# Patient Record
Sex: Female | Born: 1935 | Race: Black or African American | Hispanic: No | Smoking: Never smoker
Health system: Southern US, Community
[De-identification: ages and names within clinical notes are randomized; demographics above are authoritative.]

## PROBLEM LIST (undated history)

## (undated) DIAGNOSIS — E079 Disorder of thyroid, unspecified: Secondary | ICD-10-CM

## (undated) DIAGNOSIS — I890 Lymphedema, not elsewhere classified: Secondary | ICD-10-CM

## (undated) DIAGNOSIS — I10 Essential (primary) hypertension: Secondary | ICD-10-CM

---

## 2010-10-22 DIAGNOSIS — Z823 Family history of stroke: Secondary | ICD-10-CM | POA: Insufficient documentation

## 2010-10-23 DIAGNOSIS — I10 Essential (primary) hypertension: Secondary | ICD-10-CM | POA: Insufficient documentation

## 2013-12-28 DIAGNOSIS — M179 Osteoarthritis of knee, unspecified: Secondary | ICD-10-CM | POA: Insufficient documentation

## 2013-12-28 DIAGNOSIS — M1710 Unilateral primary osteoarthritis, unspecified knee: Secondary | ICD-10-CM | POA: Insufficient documentation

## 2013-12-28 DIAGNOSIS — M1991 Primary osteoarthritis, unspecified site: Secondary | ICD-10-CM | POA: Insufficient documentation

## 2013-12-29 DIAGNOSIS — G609 Hereditary and idiopathic neuropathy, unspecified: Secondary | ICD-10-CM | POA: Insufficient documentation

## 2019-10-18 ENCOUNTER — Ambulatory Visit (INDEPENDENT_AMBULATORY_CARE_PROVIDER_SITE_OTHER): Payer: Medicare Other | Admitting: Vascular Surgery

## 2020-03-01 ENCOUNTER — Other Ambulatory Visit: Payer: Self-pay

## 2020-03-01 ENCOUNTER — Emergency Department
Admission: EM | Admit: 2020-03-01 | Discharge: 2020-03-02 | Disposition: A | Discharge status: Home / Self Care | Payer: Medicare Other | Attending: Emergency Medicine | Admitting: Emergency Medicine

## 2020-03-01 ENCOUNTER — Encounter: Payer: Self-pay | Admitting: *Deleted

## 2020-03-01 ENCOUNTER — Emergency Department: Payer: Medicare Other

## 2020-03-01 DIAGNOSIS — R2243 Localized swelling, mass and lump, lower limb, bilateral: Secondary | ICD-10-CM | POA: Diagnosis present

## 2020-03-01 DIAGNOSIS — R0602 Shortness of breath: Secondary | ICD-10-CM

## 2020-03-01 DIAGNOSIS — R5383 Other fatigue: Secondary | ICD-10-CM | POA: Diagnosis not present

## 2020-03-01 DIAGNOSIS — R609 Edema, unspecified: Secondary | ICD-10-CM

## 2020-03-01 DIAGNOSIS — R600 Localized edema: Secondary | ICD-10-CM | POA: Diagnosis not present

## 2020-03-01 DIAGNOSIS — M7989 Other specified soft tissue disorders: Secondary | ICD-10-CM

## 2020-03-01 LAB — HEPATIC FUNCTION PANEL
ALT: 12 U/L (ref 0–44)
AST: 23 U/L (ref 15–41)
Albumin: 4.3 g/dL (ref 3.5–5.0)
Alkaline Phosphatase: 78 U/L (ref 38–126)
Bilirubin, Direct: 0.2 mg/dL (ref 0.0–0.2)
Indirect Bilirubin: 0.9 mg/dL (ref 0.3–0.9)
Total Bilirubin: 1.1 mg/dL (ref 0.3–1.2)
Total Protein: 7.4 g/dL (ref 6.5–8.1)

## 2020-03-01 LAB — BASIC METABOLIC PANEL
Anion gap: 11 (ref 5–15)
BUN: 14 mg/dL (ref 8–23)
CO2: 30 mmol/L (ref 22–32)
Calcium: 9.3 mg/dL (ref 8.9–10.3)
Chloride: 102 mmol/L (ref 98–111)
Creatinine, Ser: 0.95 mg/dL (ref 0.44–1.00)
GFR calc Af Amer: >60 mL/min (ref >60)
GFR calc non Af Amer: 54 mL/min — ABNORMAL LOW (ref >60)
Glucose, Bld: 113 mg/dL — ABNORMAL HIGH (ref 70–99)
Potassium: 2.8 mmol/L — ABNORMAL LOW (ref 3.5–5.1)
Sodium: 143 mmol/L (ref 135–145)

## 2020-03-01 LAB — CBC
HCT: 35.9 % — ABNORMAL LOW (ref 36.0–46.0)
Hemoglobin: 11.7 g/dL — ABNORMAL LOW (ref 12.0–15.0)
MCH: 27.5 pg (ref 26.0–34.0)
MCHC: 32.6 g/dL (ref 30.0–36.0)
MCV: 84.5 fL (ref 80.0–100.0)
Platelets: 241 10*3/uL (ref 150–400)
RBC: 4.25 MIL/uL (ref 3.87–5.11)
RDW: 14.3 % (ref 11.5–15.5)
WBC: 5.9 10*3/uL (ref 4.0–10.5)
nRBC: 0 % (ref 0.0–0.2)

## 2020-03-01 LAB — TROPONIN I (HIGH SENSITIVITY): Troponin I (High Sensitivity): 12 ng/L (ref <18)

## 2020-03-01 LAB — BRAIN NATRIURETIC PEPTIDE: B Natriuretic Peptide: 134.7 pg/mL — ABNORMAL HIGH (ref 0.0–100.0)

## 2020-03-01 MED ORDER — POTASSIUM CHLORIDE 10 MEQ/100ML IV SOLN
10.0000 meq | Freq: Once | INTRAVENOUS | Status: AC
  Administered 2020-03-02: 10 meq via INTRAVENOUS
  Filled 2020-03-01: qty 100

## 2020-03-01 MED ORDER — POTASSIUM CHLORIDE 10 MEQ/100ML IV SOLN
10.0000 meq | Freq: Once | INTRAVENOUS | Status: AC
  Administered 2020-03-01: 10 meq via INTRAVENOUS
  Filled 2020-03-01: qty 100

## 2020-03-01 MED ORDER — POTASSIUM CHLORIDE CRYS ER 20 MEQ PO TBCR
40.0000 meq | EXTENDED_RELEASE_TABLET | Freq: Once | ORAL | Status: AC
  Administered 2020-03-01: 40 meq via ORAL
  Filled 2020-03-01: qty 2

## 2020-03-01 NOTE — ED Triage Notes (Signed)
Pt to triage via wheelchair.  Pt has swelling to lower legs for 2 months.  Worse last few days.  No chest pain or sob.  Pt alert  Speech clear.

## 2020-03-01 NOTE — ED Provider Notes (Signed)
[facility] Emergency Department Provider Note  ____________________________________________   First MD Initiated Contact with Patient 03/01/20 2201     (approximate)  I have reviewed the triage vital signs and the nursing notes.   HISTORY  Chief Complaint Leg Swelling    HPI Latoya Sloan is a 86 y.o. female  Here with chief complaint of bilateral leg swelling. History provided by pt and daughter, who helps care for pt. She states she is here today because her legs have become increasingly swollen over the last several weeks, to the point where she is now having more difficulty getting around. She reports she has "lymphedema" in her legs but denies any known h/o CHF (though she has seen Cardiology previously per review of records). She states she has had a mild, nonproductive cough associated with her leg swelling but otherwise no new complaints. Denies any trauma. No recent immobilization or surgeries. Denies known h/o DVT. No recent medication changes.         No past medical history on file.  There are no problems to display for this patient.     Prior to Admission medications   Medication Sig Start Date End Date Taking? Authorizing Provider  amLODipine (NORVASC) 5 MG tablet Take 5 mg by mouth daily.   Yes [provider]  aspirin 81 MG chewable tablet Chew 81 mg by mouth daily.   Yes [provider]  atorvastatin (LIPITOR) 40 MG tablet Take 40 mg by mouth daily.   Yes [provider]  Cholecalciferol 50 MCG (2000 UT) CAPS Take 2,000 Units by mouth daily.   Yes [provider]  hydrochlorothiazide (HYDRODIURIL) 25 MG tablet Take 25 mg by mouth daily.   Yes [provider]  levothyroxine (SYNTHROID) 25 MCG tablet Take 25 mcg by mouth daily.   Yes [provider]  losartan (COZAAR) 100 MG tablet Take 100 mg by mouth daily.   Yes [provider]  metoprolol tartrate (LOPRESSOR) 25 MG tablet  Take 25 mg by mouth daily.   Yes [provider]  multivitamin-iron-minerals-folic acid (CENTRUM) chewable tablet Chew 1 tablet by mouth daily.   Yes [provider]  omeprazole (PRILOSEC) 40 MG capsule Take 40 mg by mouth daily.   Yes [provider]    Allergies Patient has no known allergies.  No family history on file.  Social History Social History   Tobacco Use  . Smoking status: Never Smoker  . Smokeless tobacco: Never Used  Substance Use Topics  . Alcohol use: Not Currently  . Drug use: Not Currently    Review of Systems  Review of Systems  Constitutional: Positive for fatigue. Negative for chills and fever.  HENT: Negative for congestion and sore throat.   Eyes: Negative for visual disturbance.  Respiratory: Negative for cough and shortness of breath.   Cardiovascular: Positive for leg swelling. Negative for chest pain.  Gastrointestinal: Negative for abdominal pain, diarrhea, nausea and vomiting.  Genitourinary: Negative for flank pain.  Musculoskeletal: Negative for back pain and neck pain.  Skin: Negative for rash and wound.  Allergic/Immunologic: Negative for immunocompromised state.  Neurological: Negative for weakness and numbness.  Hematological: Does not bruise/bleed easily.  All other systems reviewed and are negative.    ____________________________________________  PHYSICAL EXAM:      VITAL SIGNS: ED Triage Vitals  Enc Vitals Group     BP 03/01/20 1609 (!) 152/56     Pulse Rate 03/01/20 1609 (!) 58  Resp 03/01/20 1609 18     Temp 03/01/20 1609 99.2 F (37.3 C)     Temp Source 03/01/20 1609 Oral     SpO2 03/01/20 1609 100 %     Weight 03/01/20 1609 261 lb (118.4 kg)     Height 03/01/20 1613 5\' 7"  (1.702 m)     Head Circumference --      Peak Flow --      Pain Score 03/01/20 1613 9     Pain Loc --      Pain Edu? --      Excl. in GC? --      Physical Exam Vitals and nursing note reviewed.  Constitutional:       General: She is not in acute distress.    Appearance: She is well-developed.  HENT:     Head: Normocephalic and atraumatic.  Eyes:     Conjunctiva/sclera: Conjunctivae normal.  Cardiovascular:     Rate and Rhythm: Normal rate and regular rhythm.     Heart sounds: Normal heart sounds. No murmur heard.  No friction rub.  Pulmonary:     Effort: Pulmonary effort is normal. No respiratory distress.     Breath sounds: Rales (mild, bibasilar) present. No wheezing.  Abdominal:     General: There is no distension.     Palpations: Abdomen is soft.     Tenderness: There is no abdominal tenderness.  Musculoskeletal:     Cervical back: Neck supple.     Right lower leg: Edema (2+ pitting) present.     Left lower leg: Edema (2+ pitting) present.  Skin:    General: Skin is warm.     Capillary Refill: Capillary refill takes less than 2 seconds.  Neurological:     Mental Status: She is alert and oriented to person, place, and time.     Motor: No abnormal muscle tone.       ____________________________________________   LABS (all labs ordered are listed, but only abnormal results are displayed)  Labs Reviewed  BASIC METABOLIC PANEL - Abnormal; Notable for the following components:      Result Value   Potassium 2.8 (*)    Glucose, Bld 113 (*)    GFR calc non Af Amer 54 (*)    All other components within normal limits  CBC - Abnormal; Notable for the following components:   Hemoglobin 11.7 (*)    HCT 35.9 (*)    All other components within normal limits  BRAIN NATRIURETIC PEPTIDE - Abnormal; Notable for the following components:   B Natriuretic Peptide 134.7 (*)    All other components within normal limits  HEPATIC FUNCTION PANEL  TSH  TROPONIN I (HIGH SENSITIVITY)  TROPONIN I (HIGH SENSITIVITY)    ____________________________________________  EKG: ________________________________________  RADIOLOGY All imaging, including plain films, CT scans, and ultrasounds,  independently reviewed by me, and interpretations confirmed via formal radiology reads.  ED MD interpretation:   CXR: Cardiomegaly, possible early interstitial edema  Official radiology report(s): DG Chest Port 1 View  Result Date: 03/01/2020 CLINICAL DATA:  Lower leg swelling for 2 months EXAM: PORTABLE CHEST 1 VIEW COMPARISON:  None. FINDINGS: Cardiomegaly, vascular congestion. Interstitial prominence could reflect early interstitial edema. No effusions or acute bony abnormality. IMPRESSION: Cardiomegaly.  Suspect early interstitial edema. Electronically Signed   By: 03/03/2020 M.D.   On: 03/01/2020 23:03    ____________________________________________  PROCEDURES   Procedure(s) performed (including Critical Care):  Procedures  ____________________________________________  INITIAL IMPRESSION / MDM /  ASSESSMENT AND PLAN / ED COURSE  As part of my medical decision making, I reviewed the following data within the electronic MEDICAL RECORD NUMBER Nursing notes reviewed and incorporated, Old chart reviewed, Notes from prior ED visits, and [place] Controlled Substance Database       *Latoya Sloan was evaluated in Emergency Department on 03/02/2020 for the symptoms described in the history of present illness. She was evaluated in the context of the global COVID-19 pandemic, which necessitated consideration that the patient might be at risk for infection with the SARS-CoV-2 virus that causes COVID-19. Institutional protocols and algorithms that pertain to the evaluation of patients at risk for COVID-19 are in a state of rapid change based on information released by regulatory bodies including the CDC and federal and state organizations. These policies and algorithms were followed during the patient's care in the ED.  Some ED evaluations and interventions may be delayed as a result of limited staffing during the pandemic.*     Medical Decision Making:  86 yo F here with worsening b/l leg edema. She  reports a h/o lymphedema but exam, history is more concerning for possible underlying CHF. Per review of recent clinic notes, pt had unremarkable Cardiology w/u in the past but it has been several years. CXR shows cardiomegaly and possible edema. Renal function is normal. Potassium low at 2.8. No known liver disease.   Suspect likely mild CHF exacerbation, with unclear history of the extent of her HF per records. Given that overall she appears clinically very well, with normal WOB and sats >95% on RA, with no CP or signs of ischemia, will consider discharge once lytes replaced and lasix given, if pt appears well. Will ask Dr. Scotty Court to f/u remainder of labs, response to lasix and potassium, and reassess. ____________________________________________  FINAL CLINICAL IMPRESSION(S) / ED DIAGNOSES  Final diagnoses:  Peripheral edema  Leg swelling     MEDICATIONS GIVEN DURING THIS VISIT:  Medications  potassium chloride SA (KLOR-CON) CR tablet 40 mEq (40 mEq Oral Given 03/01/20 2255)  potassium chloride 10 mEq in 100 mL IVPB (0 mEq Intravenous Stopped 03/02/20 0015)  potassium chloride 10 mEq in 100 mL IVPB (0 mEq Intravenous Stopped 03/02/20 0206)  furosemide (LASIX) injection 20 mg (20 mg Intravenous Given 03/02/20 0214)     ED Discharge Orders    None       Note:  This document was prepared using Dragon voice recognition software and may include unintentional dictation errors.   Shaune Pollack, MD 03/02/20 [phone]

## 2020-03-02 DIAGNOSIS — R600 Localized edema: Secondary | ICD-10-CM | POA: Diagnosis not present

## 2020-03-02 LAB — TROPONIN I (HIGH SENSITIVITY): Troponin I (High Sensitivity): 13 ng/L (ref <18)

## 2020-03-02 LAB — TSH: TSH: 2.592 u[IU]/mL (ref 0.350–4.500)

## 2020-03-02 MED ORDER — FUROSEMIDE 10 MG/ML IJ SOLN
20.0000 mg | Freq: Once | INTRAMUSCULAR | Status: AC
  Administered 2020-03-02: 20 mg via INTRAVENOUS
  Filled 2020-03-02: qty 4

## 2020-03-02 MED ORDER — POTASSIUM CHLORIDE ER 10 MEQ PO TBCR: 10.0000 meq | EXTENDED_RELEASE_TABLET | Freq: Two times a day (BID) | ORAL | 0 refills | Status: DC

## 2020-03-02 MED ORDER — FUROSEMIDE 20 MG PO TABS: 20.0000 mg | ORAL_TABLET | Freq: Every day | ORAL | 0 refills | Status: DC

## 2020-03-02 NOTE — ED Notes (Signed)
Inserted [employer]ick

## 2020-03-02 NOTE — Discharge Instructions (Signed)
For your leg swelling, it is VERY important that you follow-up with a Cardiologist (Dr. Gwen Pounds is on call for [facility] today) and your primary doctor.  You should have an echocardiogram to look at your heart function in the next 1-2 weeks. Discuss this with your doctor.  For now, start taking the lasix as prescribed with potassium.   Try to eat a low-salt diet.  Elevate your legs as often as possible.

## 2020-03-02 NOTE — ED Notes (Signed)
Discharge paperwork, followup and prescriptions reviewed with patient and patients daughter. Pt and daughter state understanding, deny further questions. Pt gave verbal consent for DC, sign pad unavailable.

## 2020-03-02 NOTE — ED Provider Notes (Signed)
Procedures     ----------------------------------------- 3:08 AM on 03/02/2020 ----------------------------------------- Patient has received IV and oral potassium supplementation.  TSH and serial troponins are normal.  Vitals unremarkable, breathing stable, stable for discharge home with prescriptions for oral Lasix and potassium.       Sharman Cheek, MD 03/02/20 [phone]

## 2020-03-02 NOTE — ED Notes (Signed)
Assisted pt to toilet 

## 2020-03-06 ENCOUNTER — Ambulatory Visit (INDEPENDENT_AMBULATORY_CARE_PROVIDER_SITE_OTHER): Payer: Medicare Other | Admitting: Nurse Practitioner

## 2020-03-06 ENCOUNTER — Other Ambulatory Visit: Payer: Self-pay

## 2020-03-06 ENCOUNTER — Encounter (INDEPENDENT_AMBULATORY_CARE_PROVIDER_SITE_OTHER): Payer: Self-pay | Admitting: Nurse Practitioner

## 2020-03-06 VITALS — BP 146/74 | HR 64 | Ht 67.0 in | Wt 256.0 lb

## 2020-03-06 DIAGNOSIS — E785 Hyperlipidemia, unspecified: Secondary | ICD-10-CM | POA: Insufficient documentation

## 2020-03-06 DIAGNOSIS — K219 Gastro-esophageal reflux disease without esophagitis: Secondary | ICD-10-CM | POA: Insufficient documentation

## 2020-03-06 DIAGNOSIS — M170 Bilateral primary osteoarthritis of knee: Secondary | ICD-10-CM | POA: Insufficient documentation

## 2020-03-06 DIAGNOSIS — R430 Anosmia: Secondary | ICD-10-CM | POA: Insufficient documentation

## 2020-03-06 DIAGNOSIS — I6523 Occlusion and stenosis of bilateral carotid arteries: Secondary | ICD-10-CM | POA: Diagnosis not present

## 2020-03-06 DIAGNOSIS — R053 Chronic cough: Secondary | ICD-10-CM | POA: Insufficient documentation

## 2020-03-06 DIAGNOSIS — I10 Essential (primary) hypertension: Secondary | ICD-10-CM

## 2020-03-06 DIAGNOSIS — I890 Lymphedema, not elsewhere classified: Secondary | ICD-10-CM | POA: Diagnosis not present

## 2020-03-06 DIAGNOSIS — R0609 Other forms of dyspnea: Secondary | ICD-10-CM | POA: Insufficient documentation

## 2020-03-06 DIAGNOSIS — G43909 Migraine, unspecified, not intractable, without status migrainosus: Secondary | ICD-10-CM | POA: Insufficient documentation

## 2020-03-06 NOTE — Progress Notes (Signed)
Subjective:    Patient ID: Latoya Sloan, female    DOB: 1931-12-23, 86 y.o.   MRN: [id number] Chief Complaint  Patient presents with  . New Patient (Initial Visit)    kaliseti. lymphedema    The patient presents today for evaluation of bilateral lower extremity edema.  The patient recently relocated to live with her daughter and has a known history of lymphedema.  The patient subsequently presented to the emergency room with shortness of breath and was found to have some cardiomegaly.  Today the patient also followed up with cardiology and will have an upcoming echocardiogram in addition to a carotid artery duplex due to her carotid bruit.  The patient endorses wearing medical grade 1 compression wraps consistently at, however despite this the patient has had continued edema.  The patient also elevates her lower extremities as much as possible.  Prior to relocating from wound and the patient was being seen at a lymphedema clinic.  She notes that the lymphedema clinic was also helpful in controlling her lower extremity edema.  Currently she denies any chest pain or shortness of breath.  She denies any TIA-like symptoms.  The patient also notes that she has a known history of a Baker's cyst in her right lower extremity.   Review of Systems  Cardiovascular: Positive for leg swelling.  Skin: Positive for wound.  All other systems reviewed and are negative.      Objective:   Physical Exam Vitals reviewed.  Constitutional:      Appearance: She is obese.  HENT:     Head: Normocephalic.  Neck:     Vascular: Carotid bruit present.  Cardiovascular:     Rate and Rhythm: Normal rate and regular rhythm.     Pulses: Normal pulses.  Pulmonary:     Effort: Pulmonary effort is normal.  Musculoskeletal:     Right lower leg: 3+ Edema present.     Left lower leg: 3+ Edema present.  Skin:    General: Skin is warm and dry.  Neurological:     Mental Status: She is alert and oriented to person,  place, and time.     Motor: Weakness present.     Gait: Gait abnormal.  Psychiatric:        Mood and Affect: Mood normal.        Behavior: Behavior normal.        Thought Content: Thought content normal.        Judgment: Judgment normal.     BP (!) 146/74   Pulse 64   Ht 5\' 7"  (1.702 m)   Wt 256 lb (116.1 kg)   BMI 40.10 kg/m   History reviewed. No pertinent past medical history.  Social History   Socioeconomic History  . Marital status: Unknown    Spouse name: Not on file  . Number of children: Not on file  . Years of education: Not on file  . Highest education level: Not on file  Occupational History  . Not on file  Tobacco Use  . Smoking status: Never Smoker  . Smokeless tobacco: Never Used  Substance and Sexual Activity  . Alcohol use: Not Currently  . Drug use: Not Currently  . Sexual activity: Not on file  Other Topics Concern  . Not on file  Social History Narrative  . Not on file   Social Determinants of Health   Financial Resource Strain:   . Difficulty of Paying Living Expenses: Not on file  Food Insecurity:   .  Worried About Programme researcher, broadcasting/film/video in the Last Year: Not on file  . Ran Out of Food in the Last Year: Not on file  Transportation Needs:   . Lack of Transportation (Medical): Not on file  . Lack of Transportation (Non-Medical): Not on file  Physical Activity:   . Days of Exercise per Week: Not on file  . Minutes of Exercise per Session: Not on file  Stress:   . Feeling of Stress : Not on file  Social Connections:   . Frequency of Communication with Friends and Family: Not on file  . Frequency of Social Gatherings with Friends and Family: Not on file  . Attends Religious Services: Not on file  . Active Member of Clubs or Organizations: Not on file  . Attends Banker Meetings: Not on file  . Marital Status: Not on file  Intimate Partner Violence:   . Fear of Current or Ex-Partner: Not on file  . Emotionally Abused: Not on  file  . Physically Abused: Not on file  . Sexually Abused: Not on file    History reviewed. No pertinent surgical history.  History reviewed. No pertinent family history.  No Known Allergies     Assessment & Plan:   1. Lymphedema The patient has a longstanding history of lymphedema.  The patient diligently wears medical grade 1 compression stockings in addition to elevation and exercise as tolerable.  The patient does not have a lymphedema pump at this time due to cost, however she is unsure why she was able to get it through her insurance.  The patient does have some early signs of blistering.  Based on this we will place the patient in bilateral Unna wraps.  Patient will continue to elevate her lower extremities as well.  These are  wraps to be changed on a weekly basis.  We will have the patient follow-up in 4 weeks for evaluation of the progress with Unna wraps in addition to a bilateral lower extremity venous reflux to assess for any possible venous insufficiency.  2. Bilateral carotid artery stenosis Patient notes that a bruit was heard during her office visit with cardiology.  The patient notes that the cardiologist plans on ordering a carotid duplex to evaluate carotid artery stenosis.  I discussed the typical monitoring for carotid artery stenosis as well as the typical level of intervention which would be 70 to 75%.  Advised that the patient was welcome to follow-up with cardiology until intervention was necessary or if cardiology preferred we could follow carotid artery stenosis if necessary.  3. Essential hypertension Continue antihypertensive medications as already ordered, these medications have been reviewed and there are no changes at this time.    Current Outpatient Medications on File Prior to Visit  Medication Sig Dispense Refill  . aspirin 81 MG chewable tablet Chew 81 mg by mouth daily.    Marland Kitchen atorvastatin (LIPITOR) 40 MG tablet Take 40 mg by mouth daily.    .  Cholecalciferol 50 MCG (2000 UT) CAPS Take 2,000 Units by mouth daily.    . furosemide (LASIX) 20 MG tablet Take 1 tablet (20 mg total) by mouth daily for 10 days. 10 tablet 0  . hydrochlorothiazide (HYDRODIURIL) 25 MG tablet Take 25 mg by mouth daily.    Marland Kitchen levothyroxine (SYNTHROID) 25 MCG tablet Take 25 mcg by mouth daily.    Marland Kitchen losartan (COZAAR) 100 MG tablet Take 100 mg by mouth daily.    . metoprolol tartrate (LOPRESSOR) 25 MG  tablet Take 25 mg by mouth daily.    . multivitamin-iron-minerals-folic acid (CENTRUM) chewable tablet Chew 1 tablet by mouth daily.    Marland Kitchen omeprazole (PRILOSEC) 40 MG capsule Take 40 mg by mouth daily.    . potassium chloride (KLOR-CON) 10 MEQ tablet Take 1 tablet (10 mEq total) by mouth 2 (two) times daily for 14 days. 28 tablet 0  . amLODipine (NORVASC) 5 MG tablet Take 5 mg by mouth daily. (Patient not taking: Reported on 03/06/2020)     No current facility-administered medications on file prior to visit.    There are no Patient Instructions on file for this visit. No follow-ups on file.   Georgiana Spinner, NP

## 2020-03-14 ENCOUNTER — Other Ambulatory Visit: Payer: Self-pay

## 2020-03-14 ENCOUNTER — Ambulatory Visit (INDEPENDENT_AMBULATORY_CARE_PROVIDER_SITE_OTHER): Payer: Medicare Other | Admitting: Nurse Practitioner

## 2020-03-14 VITALS — BP 155/60 | HR 55 | Ht 67.0 in | Wt 249.0 lb

## 2020-03-14 DIAGNOSIS — I890 Lymphedema, not elsewhere classified: Secondary | ICD-10-CM

## 2020-03-14 NOTE — Progress Notes (Signed)
History of Present Illness  There is no documented history at this time  Assessments & Plan   There are no diagnoses linked to this encounter.    Additional instructions  Subjective:  Patient presents with venous ulcer of the Bilateral lower extremity.    Procedure:  3 layer unna wrap was placed Bilateral lower extremity.   Plan:   Follow up in one week.  

## 2020-03-15 ENCOUNTER — Encounter (INDEPENDENT_AMBULATORY_CARE_PROVIDER_SITE_OTHER): Payer: Self-pay | Admitting: Nurse Practitioner

## 2020-03-20 DIAGNOSIS — R200 Anesthesia of skin: Secondary | ICD-10-CM | POA: Insufficient documentation

## 2020-03-20 DIAGNOSIS — M7989 Other specified soft tissue disorders: Secondary | ICD-10-CM | POA: Insufficient documentation

## 2020-03-20 DIAGNOSIS — I890 Lymphedema, not elsewhere classified: Secondary | ICD-10-CM | POA: Insufficient documentation

## 2020-03-21 ENCOUNTER — Ambulatory Visit (INDEPENDENT_AMBULATORY_CARE_PROVIDER_SITE_OTHER): Payer: Medicare Other | Admitting: Nurse Practitioner

## 2020-03-21 ENCOUNTER — Other Ambulatory Visit: Payer: Self-pay

## 2020-03-21 VITALS — BP 121/70 | HR 60 | Ht 67.0 in | Wt 250.0 lb

## 2020-03-21 DIAGNOSIS — I890 Lymphedema, not elsewhere classified: Secondary | ICD-10-CM

## 2020-03-21 NOTE — Progress Notes (Signed)
History of Present Illness  There is no documented history at this time  Assessments & Plan   There are no diagnoses linked to this encounter.    Additional instructions  Subjective:  Patient presents with venous ulcer of the Bilateral lower extremity.    Procedure:  3 layer unna wrap was placed Bilateral lower extremity.   Plan:   Follow up in one week.  

## 2020-03-22 ENCOUNTER — Other Ambulatory Visit (INDEPENDENT_AMBULATORY_CARE_PROVIDER_SITE_OTHER): Payer: Self-pay | Admitting: Nurse Practitioner

## 2020-03-22 DIAGNOSIS — I890 Lymphedema, not elsewhere classified: Secondary | ICD-10-CM

## 2020-03-25 ENCOUNTER — Encounter (INDEPENDENT_AMBULATORY_CARE_PROVIDER_SITE_OTHER): Payer: Self-pay | Admitting: Nurse Practitioner

## 2020-03-27 ENCOUNTER — Ambulatory Visit (INDEPENDENT_AMBULATORY_CARE_PROVIDER_SITE_OTHER): Payer: Medicare Other | Admitting: Nurse Practitioner

## 2020-03-27 ENCOUNTER — Ambulatory Visit (INDEPENDENT_AMBULATORY_CARE_PROVIDER_SITE_OTHER): Payer: Medicare Other | Admitting: Vascular Surgery

## 2020-03-27 ENCOUNTER — Other Ambulatory Visit: Payer: Self-pay

## 2020-03-27 ENCOUNTER — Encounter (INDEPENDENT_AMBULATORY_CARE_PROVIDER_SITE_OTHER): Payer: Medicare Other

## 2020-03-27 VITALS — BP 155/75 | HR 59 | Ht 67.0 in | Wt 251.0 lb

## 2020-03-27 DIAGNOSIS — I890 Lymphedema, not elsewhere classified: Secondary | ICD-10-CM | POA: Diagnosis not present

## 2020-03-27 NOTE — Progress Notes (Signed)
History of Present Illness  There is no documented history at this time  Assessments & Plan   There are no diagnoses linked to this encounter.    Additional instructions  Subjective:  Patient presents with venous ulcer of the Bilateral lower extremity.    Procedure:  3 layer unna wrap was placed Bilateral lower extremity.   Plan:   Follow up in one week.  

## 2020-03-28 ENCOUNTER — Encounter (INDEPENDENT_AMBULATORY_CARE_PROVIDER_SITE_OTHER): Payer: Medicare Other

## 2020-03-30 ENCOUNTER — Encounter (INDEPENDENT_AMBULATORY_CARE_PROVIDER_SITE_OTHER): Payer: Self-pay | Admitting: Nurse Practitioner

## 2020-04-06 ENCOUNTER — Other Ambulatory Visit: Payer: Self-pay

## 2020-04-06 ENCOUNTER — Ambulatory Visit (INDEPENDENT_AMBULATORY_CARE_PROVIDER_SITE_OTHER): Payer: Medicare Other | Admitting: Nurse Practitioner

## 2020-04-06 ENCOUNTER — Ambulatory Visit (INDEPENDENT_AMBULATORY_CARE_PROVIDER_SITE_OTHER): Payer: Medicare Other

## 2020-04-06 ENCOUNTER — Encounter (INDEPENDENT_AMBULATORY_CARE_PROVIDER_SITE_OTHER): Payer: Self-pay | Admitting: Nurse Practitioner

## 2020-04-06 VITALS — BP 187/79 | HR 60 | Resp 16 | Wt 249.6 lb

## 2020-04-06 DIAGNOSIS — I890 Lymphedema, not elsewhere classified: Secondary | ICD-10-CM | POA: Diagnosis not present

## 2020-04-06 DIAGNOSIS — I10 Essential (primary) hypertension: Secondary | ICD-10-CM | POA: Diagnosis not present

## 2020-04-06 DIAGNOSIS — E785 Hyperlipidemia, unspecified: Secondary | ICD-10-CM

## 2020-04-06 DIAGNOSIS — I5032 Chronic diastolic (congestive) heart failure: Secondary | ICD-10-CM | POA: Insufficient documentation

## 2020-04-11 ENCOUNTER — Other Ambulatory Visit: Payer: Self-pay | Admitting: Physician Assistant

## 2020-04-11 ENCOUNTER — Encounter (INDEPENDENT_AMBULATORY_CARE_PROVIDER_SITE_OTHER): Payer: Medicare Other

## 2020-04-11 ENCOUNTER — Ambulatory Visit (INDEPENDENT_AMBULATORY_CARE_PROVIDER_SITE_OTHER): Payer: Medicare Other | Admitting: Nurse Practitioner

## 2020-04-11 ENCOUNTER — Encounter (INDEPENDENT_AMBULATORY_CARE_PROVIDER_SITE_OTHER): Payer: Self-pay | Admitting: Nurse Practitioner

## 2020-04-11 DIAGNOSIS — R430 Anosmia: Secondary | ICD-10-CM

## 2020-04-11 NOTE — Progress Notes (Signed)
Subjective:    Patient ID: Latoya Sloan, female    DOB: 01-20-1932, 86 y.o.   MRN: [id number] Chief Complaint  Patient presents with  . Follow-up    4wk unna boot check    The patient returns to the office for followup evaluation regarding leg swelling.  The patient was placed in bilateral Unna wraps for 4 weeks and she is noted that the pain and discomfort she feels has improved however the swelling has not completely resolved.  The patient was in Unna wraps for 4 weeks.  The patient has transition back to wearing her medical grade 1 compression socks and despite this, the swelling has persisted and the pain associated with swelling continues. There have not been any interval development of a ulcerations or wounds.  Since the previous visit the patient has been wearing graduated compression stockings and has noted little if any improvement in the lymphedema. The patient has been using compression routinely morning until night.  The patient also states elevation during the day and exercise is being done too.  Prior to relocating the patient was being seen at a lymphedema clinic and found the lymphatic massage was very helpful.  Noninvasive studies show no evidence of DVT or superficial venous thrombosis.  No evidence of deep venous insufficiency bilaterally.  No evidence of superficial venous insufficiency seen bilaterally.   Review of Systems  Cardiovascular: Positive for leg swelling.  All other systems reviewed and are negative.      Objective:   Physical Exam Vitals reviewed.  HENT:     Head: Normocephalic.  Cardiovascular:     Rate and Rhythm: Normal rate.     Pulses: Normal pulses.  Pulmonary:     Effort: Pulmonary effort is normal.  Musculoskeletal:     Right lower leg: 2+ Edema present.     Left lower leg: 2+ Edema present.  Skin:    General: Skin is warm and dry.     Comments: Dermal thickening bilaterally with stasis dermatitis  Neurological:     Mental Status:  She is alert and oriented to person, place, and time.  Psychiatric:        Mood and Affect: Mood normal.        Behavior: Behavior normal.        Thought Content: Thought content normal.        Judgment: Judgment normal.     BP (!) 187/79 (BP Location: Left Arm)   Pulse 60   Resp 16   Wt 249 lb 9.6 oz (113.2 kg)   BMI 39.09 kg/m   History reviewed. No pertinent past medical history.  Social History   Socioeconomic History  . Marital status: Unknown    Spouse name: Not on file  . Number of children: Not on file  . Years of education: Not on file  . Highest education level: Not on file  Occupational History  . Not on file  Tobacco Use  . Smoking status: Never Smoker  . Smokeless tobacco: Never Used  Substance and Sexual Activity  . Alcohol use: Not Currently  . Drug use: Not Currently  . Sexual activity: Not on file  Other Topics Concern  . Not on file  Social History Narrative  . Not on file   Social Determinants of Health   Financial Resource Strain:   . Difficulty of Paying Living Expenses: Not on file  Food Insecurity:   . Worried About Programme researcher, broadcasting/film/video in the Last Year: Not on  file  . Ran Out of Food in the Last Year: Not on file  Transportation Needs:   . Lack of Transportation (Medical): Not on file  . Lack of Transportation (Non-Medical): Not on file  Physical Activity:   . Days of Exercise per Week: Not on file  . Minutes of Exercise per Session: Not on file  Stress:   . Feeling of Stress : Not on file  Social Connections:   . Frequency of Communication with Friends and Family: Not on file  . Frequency of Social Gatherings with Friends and Family: Not on file  . Attends Religious Services: Not on file  . Active Member of Clubs or Organizations: Not on file  . Attends Banker Meetings: Not on file  . Marital Status: Not on file  Intimate Partner Violence:   . Fear of Current or Ex-Partner: Not on file  . Emotionally Abused: Not on  file  . Physically Abused: Not on file  . Sexually Abused: Not on file    History reviewed. No pertinent surgical history.  History reviewed. No pertinent family history.  No Known Allergies     Assessment & Plan:   1. Lymphedema of both lower extremities Recommend:  No surgery or intervention at this point in time.    I have reviewed my previous discussion with the patient regarding swelling and why it causes symptoms.  Patient will continue wearing graduated compression stockings class 1 (20-30 mmHg) on a daily basis. The patient will  beginning wearing the stockings first thing in the morning and removing them in the evening. The patient is instructed specifically not to sleep in the stockings.    In addition, behavioral modification including several periods of elevation of the lower extremities during the day will be continued.  This was reviewed with the patient during the initial visit.  The patient will also continue routine exercise, especially walking on a daily basis as was discussed during the initial visit.    Despite conservative treatments including graduated compression therapy class 1 and behavioral modification including exercise and elevation the patient  has not obtained adequate control of the lymphedema.  The patient still has stage 3 lymphedema and therefore, I believe that a lymph pump should be added to improve the control of the patient's lymphedema.  Additionally, a lymph pump is warranted because it will reduce the risk of cellulitis and ulceration in the future.  Patient should follow-up in six months    2. Primary hypertension Continue antihypertensive medications as already ordered, these medications have been reviewed and there are no changes at this time.   3. Hyperlipidemia, unspecified hyperlipidemia type Continue statin as ordered and reviewed, no changes at this time    Current Outpatient Medications on File Prior to Visit  Medication Sig  Dispense Refill  . aspirin 81 MG chewable tablet Chew 81 mg by mouth daily.    Marland Kitchen atorvastatin (LIPITOR) 40 MG tablet Take 40 mg by mouth daily.    . Cholecalciferol 50 MCG (2000 UT) CAPS Take 2,000 Units by mouth daily.    . hydrochlorothiazide (HYDRODIURIL) 25 MG tablet Take 25 mg by mouth daily.    Marland Kitchen levothyroxine (SYNTHROID) 25 MCG tablet Take 25 mcg by mouth daily.    . metoprolol tartrate (LOPRESSOR) 25 MG tablet Take 25 mg by mouth daily.    . multivitamin-iron-minerals-folic acid (CENTRUM) chewable tablet Chew 1 tablet by mouth daily.    Marland Kitchen omeprazole (PRILOSEC) 40 MG capsule Take 40 mg  by mouth daily.    Marland Kitchen telmisartan (MICARDIS) 40 MG tablet Take by mouth.    Marland Kitchen amLODipine (NORVASC) 5 MG tablet Take 5 mg by mouth daily.  (Patient not taking: Reported on 04/06/2020)    . furosemide (LASIX) 20 MG tablet Take 1 tablet (20 mg total) by mouth daily for 10 days. 10 tablet 0  . losartan (COZAAR) 100 MG tablet Take 100 mg by mouth daily. (Patient not taking: Reported on 04/06/2020)    . potassium chloride (KLOR-CON) 10 MEQ tablet Take 1 tablet (10 mEq total) by mouth 2 (two) times daily for 14 days. 28 tablet 0   No current facility-administered medications on file prior to visit.    There are no Patient Instructions on file for this visit. No follow-ups on file.   Georgiana Spinner, NP

## 2020-04-26 ENCOUNTER — Other Ambulatory Visit: Payer: Self-pay

## 2020-04-26 ENCOUNTER — Ambulatory Visit
Admission: RE | Admit: 2020-04-26 | Discharge: 2020-04-26 | Disposition: A | Discharge status: Home / Self Care | Payer: Medicare Other | Source: Ambulatory Visit | Attending: Physician Assistant | Admitting: Physician Assistant

## 2020-04-26 DIAGNOSIS — R430 Anosmia: Secondary | ICD-10-CM | POA: Diagnosis not present

## 2020-04-26 MED ORDER — GADOBUTROL 1 MMOL/ML IV SOLN
10.0000 mL | Freq: Once | INTRAVENOUS | Status: AC | PRN
  Administered 2020-04-26: 10 mL via INTRAVENOUS

## 2020-05-03 ENCOUNTER — Other Ambulatory Visit: Payer: Self-pay

## 2020-05-03 ENCOUNTER — Emergency Department
Admission: EM | Admit: 2020-05-03 | Discharge: 2020-05-03 | Disposition: A | Discharge status: Home / Self Care | Payer: Medicare Other | Attending: Emergency Medicine | Admitting: Emergency Medicine

## 2020-05-03 DIAGNOSIS — I10 Essential (primary) hypertension: Secondary | ICD-10-CM | POA: Diagnosis present

## 2020-05-03 DIAGNOSIS — Z5321 Procedure and treatment not carried out due to patient leaving prior to being seen by health care provider: Secondary | ICD-10-CM | POA: Diagnosis not present

## 2020-05-03 DIAGNOSIS — W108XXA Fall (on) (from) other stairs and steps, initial encounter: Secondary | ICD-10-CM | POA: Diagnosis not present

## 2020-05-03 HISTORY — DX: Disorder of thyroid, unspecified: E07.9

## 2020-05-03 HISTORY — DX: Essential (primary) hypertension: I10

## 2020-05-03 HISTORY — DX: Lymphedema, not elsewhere classified: I89.0

## 2020-05-03 NOTE — ED Triage Notes (Signed)
Pt states she fell down 6 stairs- pt uses a cane- pt states she had at call EMS to help her get up and they took her BP and it was elevated in the 200s- pt states she has a HX of HTN and took her medication for it this morning

## 2020-05-03 NOTE — ED Notes (Signed)
Per daughter she is going to take pt to ortho urgent care

## 2020-09-30 ENCOUNTER — Other Ambulatory Visit: Payer: Self-pay

## 2020-09-30 ENCOUNTER — Emergency Department
Admission: EM | Admit: 2020-09-30 | Discharge: 2020-09-30 | Disposition: A | Discharge status: Home / Self Care | Payer: Medicare Other | Attending: Emergency Medicine | Admitting: Emergency Medicine

## 2020-09-30 ENCOUNTER — Encounter: Payer: Self-pay | Admitting: Emergency Medicine

## 2020-09-30 DIAGNOSIS — I110 Hypertensive heart disease with heart failure: Secondary | ICD-10-CM | POA: Insufficient documentation

## 2020-09-30 DIAGNOSIS — I5032 Chronic diastolic (congestive) heart failure: Secondary | ICD-10-CM | POA: Diagnosis not present

## 2020-09-30 DIAGNOSIS — Z79899 Other long term (current) drug therapy: Secondary | ICD-10-CM | POA: Diagnosis not present

## 2020-09-30 DIAGNOSIS — E079 Disorder of thyroid, unspecified: Secondary | ICD-10-CM | POA: Diagnosis not present

## 2020-09-30 DIAGNOSIS — I10 Essential (primary) hypertension: Secondary | ICD-10-CM | POA: Diagnosis present

## 2020-09-30 DIAGNOSIS — Z7982 Long term (current) use of aspirin: Secondary | ICD-10-CM | POA: Insufficient documentation

## 2020-09-30 LAB — CBC
HCT: 40.3 % (ref 36.0–46.0)
Hemoglobin: 12.9 g/dL (ref 12.0–15.0)
MCH: 26.7 pg (ref 26.0–34.0)
MCHC: 32 g/dL (ref 30.0–36.0)
MCV: 83.4 fL (ref 80.0–100.0)
Platelets: 217 10*3/uL (ref 150–400)
RBC: 4.83 MIL/uL (ref 3.87–5.11)
RDW: 15.1 % (ref 11.5–15.5)
WBC: 6 10*3/uL (ref 4.0–10.5)
nRBC: 0 % (ref 0.0–0.2)

## 2020-09-30 LAB — BASIC METABOLIC PANEL
Anion gap: 11 (ref 5–15)
BUN: 20 mg/dL (ref 8–23)
CO2: 27 mmol/L (ref 22–32)
Calcium: 9.4 mg/dL (ref 8.9–10.3)
Chloride: 105 mmol/L (ref 98–111)
Creatinine, Ser: 0.94 mg/dL (ref 0.44–1.00)
GFR, Estimated: 59 mL/min — ABNORMAL LOW (ref >60)
Glucose, Bld: 110 mg/dL — ABNORMAL HIGH (ref 70–99)
Potassium: 3.8 mmol/L (ref 3.5–5.1)
Sodium: 143 mmol/L (ref 135–145)

## 2020-09-30 LAB — TROPONIN I (HIGH SENSITIVITY): Troponin I (High Sensitivity): 8 ng/L (ref <18)

## 2020-09-30 MED ORDER — CARVEDILOL 6.25 MG PO TABS
6.2500 mg | ORAL_TABLET | Freq: Once | ORAL | Status: AC
  Administered 2020-09-30: 6.25 mg via ORAL
  Filled 2020-09-30: qty 1

## 2020-09-30 MED ORDER — CARVEDILOL 6.25 MG PO TABS: 6.2500 mg | ORAL_TABLET | Freq: Two times a day (BID) | ORAL | 11 refills | Status: DC

## 2020-09-30 NOTE — Discharge Instructions (Addendum)
Follow-up with your regular doctor.  Take your blood pressure later this afternoon.  The new medication should help to decrease your blood pressure.  This medication is taken twice daily.  You may take another dose later tonight.  Return emergency department if worsening.

## 2020-09-30 NOTE — ED Provider Notes (Addendum)
[facility] Emergency Department Provider Note  ____________________________________________   Event Date/Time   First MD Initiated Contact with Patient 09/30/20 1159     (approximate)  I have reviewed the triage vital signs and the nursing notes.   HISTORY  Chief Complaint Hypertension    HPI Latoya Sloan is a 87 y.o. female presents emergency department complaint of elevated blood pressure.  Patient saw her cardiologist last week who told her that her blood pressure medication was not working well so he started her on Coreg 6.25 mg.  She is not received the medication.  Therefore she is not taking the new medication.  Patient states she was at a church failure earlier today clinic checked her blood pressure and it was 225/81.  She denies headache, chest pain, shortness of breath    Past Medical History:  Diagnosis Date  . Hypertension   . Lymphedema of both lower extremities   . Thyroid disease     Patient Active Problem List   Diagnosis Date Noted  . Chronic diastolic CHF (congestive heart failure), NYHA class 2 (HCC) 04/06/2020  . Leg numbness 03/20/2020  . Leg swelling 03/20/2020  . Lymphedema of both lower extremities 03/20/2020  . Anosmia 03/06/2020  . Bilateral carotid artery stenosis 03/06/2020  . Chronic cough 03/06/2020  . Dyspnea on exertion 03/06/2020  . GERD (gastroesophageal reflux disease) 03/06/2020  . Hyperlipidemia 03/06/2020  . Hypertension 03/06/2020  . Lymphedema 03/06/2020  . Migraines 03/06/2020  . Osteoarthritis of both knees 03/06/2020    History reviewed. No pertinent surgical history.  Prior to Admission medications   Medication Sig Start Date End Date Taking? Authorizing Provider  carvedilol (COREG) 6.25 MG tablet Take 1 tablet (6.25 mg total) by mouth 2 (two) times daily. 09/30/20 09/30/21 Yes Fisher, Roselyn Bering, PA-C  amLODipine (NORVASC) 5 MG tablet Take 5 mg by mouth daily.  Patient not taking: Reported on  04/06/2020    [provider]  aspirin 81 MG chewable tablet Chew 81 mg by mouth daily.    [provider]  atorvastatin (LIPITOR) 40 MG tablet Take 40 mg by mouth daily.    [provider]  Cholecalciferol 50 MCG (2000 UT) CAPS Take 2,000 Units by mouth daily.    [provider]  furosemide (LASIX) 20 MG tablet Take 1 tablet (20 mg total) by mouth daily for 10 days. 03/02/20 03/12/20  Sharman Cheek, MD  hydrochlorothiazide (HYDRODIURIL) 25 MG tablet Take 25 mg by mouth daily.    [provider]  levothyroxine (SYNTHROID) 25 MCG tablet Take 25 mcg by mouth daily.    [provider]  losartan (COZAAR) 100 MG tablet Take 100 mg by mouth daily. Patient not taking: Reported on 04/06/2020    [provider]  metoprolol tartrate (LOPRESSOR) 25 MG tablet Take 25 mg by mouth daily.    [provider]  multivitamin-iron-minerals-folic acid (CENTRUM) chewable tablet Chew 1 tablet by mouth daily.    [provider]  omeprazole (PRILOSEC) 40 MG capsule Take 40 mg by mouth daily.    [provider]  potassium chloride (KLOR-CON) 10 MEQ tablet Take 1 tablet (10 mEq total) by mouth 2 (two) times daily for 14 days. 03/02/20 03/16/20  Sharman Cheek, MD  telmisartan (MICARDIS) 40 MG tablet Take by mouth. 04/06/20 04/06/21  [provider]    Allergies Patient has no known allergies.  No family history on file.  Social History Social History   Tobacco Use  .  Smoking status: Never Smoker  . Smokeless tobacco: Never Used  Substance Use Topics  . Alcohol use: Not Currently  . Drug use: Not Currently    Review of Systems  Constitutional: No fever/chills Eyes: No visual changes. ENT: No sore throat. Respiratory: Denies cough Cardiovascular: Denies chest pain Gastrointestinal: Denies abdominal pain Genitourinary: Negative for dysuria. Musculoskeletal: Negative for back pain. Skin: Negative for  rash. Psychiatric: no mood changes,     ____________________________________________   PHYSICAL EXAM:  VITAL SIGNS: ED Triage Vitals  Enc Vitals Group     BP 09/30/20 1123 (!) 199/81     Pulse Rate 09/30/20 1123 67     Resp 09/30/20 1123 20     Temp 09/30/20 1126 97.9 F (36.6 C)     Temp Source 09/30/20 1126 Oral     SpO2 09/30/20 1123 100 %     Weight 09/30/20 1124 245 lb (111.1 kg)     Height 09/30/20 1124 5\' 7"  (1.702 m)     Head Circumference --      Peak Flow --      Pain Score 09/30/20 1123 0     Pain Loc --      Pain Edu? --      Excl. in GC? --     Constitutional: Alert and oriented. Well appearing and in no acute distress. Eyes: Conjunctivae are normal.  Head: Atraumatic. Nose: No congestion/rhinnorhea. Mouth/Throat: Mucous membranes are moist.   Neck:  supple no lymphadenopathy noted Cardiovascular: Normal rate, regular rhythm. Heart sounds are normal Respiratory: Normal respiratory effort.  No retractions, lungs c t a  GU: deferred Musculoskeletal: FROM all extremities, warm and well perfused Neurologic:  Normal speech and language.  Skin:  Skin is warm, dry and intact. No rash noted. Psychiatric: Mood and affect are normal. Speech and behavior are normal.  ____________________________________________   LABS (all labs ordered are listed, but only abnormal results are displayed)  Labs Reviewed  BASIC METABOLIC PANEL - Abnormal; Notable for the following components:      Result Value   Glucose, Bld 110 (*)    GFR, Estimated 59 (*)    All other components within normal limits  CBC  TROPONIN I (HIGH SENSITIVITY)   ____________________________________________   ____________________________________________  RADIOLOGY    ____________________________________________   PROCEDURES  Procedure(s) performed: No  Procedures    ____________________________________________   INITIAL IMPRESSION / ASSESSMENT AND PLAN / ED COURSE  Pertinent  labs & imaging results that were available during my care of the patient were reviewed by me and considered in my medical decision making (see chart for details).   Patient is a 87-year-old female presents with her daughters emergency department with concerns of her elevated blood pressure.  See HPI.  Physical exam shows patient were stable.  Blood pressure has actually improved since the church fair, blood pressure measured at 184/74.  DDx: Primary hypertension, AKI, MI  Labs are reassuring.  CBC, metabolic panel, and troponin are all normal  EKG shows normal sinus rhythm, see physician read  Patient was given a dose of Coreg 6.25 mg.  She was instructed to rest and take it easy today.  She is to take a second dose later today.  Did call in a prescription as she is waiting for her medications from the [facility].  Since she is asymptomatic and her blood pressure is below 200 systolic feel she is stable to be discharged.  She was discharged in the care of her daughter.  Latoya Sloan was evaluated in Emergency Department on 09/30/2020 for the symptoms described in the history of present illness. She was evaluated in the context of the global COVID-19 pandemic, which necessitated consideration that the patient might be at risk for infection with the SARS-CoV-2 virus that causes COVID-19. Institutional protocols and algorithms that pertain to the evaluation of patients at risk for COVID-19 are in a state of rapid change based on information released by regulatory bodies including the CDC and federal and state organizations. These policies and algorithms were followed during the patient's care in the ED.    As part of my medical decision making, I reviewed the following data within the electronic MEDICAL RECORD NUMBER History obtained from family, Nursing notes reviewed and incorporated, Labs reviewed , EKG interpreted see physician, Old chart reviewed, Evaluated by EM attending Dr. Katrinka Blazing, Notes from prior ED  visits and [place] Controlled Substance Database  ____________________________________________   FINAL CLINICAL IMPRESSION(S) / ED DIAGNOSES  Final diagnoses:  Primary hypertension      NEW MEDICATIONS STARTED DURING THIS VISIT:  New Prescriptions   CARVEDILOL (COREG) 6.25 MG TABLET    Take 1 tablet (6.25 mg total) by mouth 2 (two) times daily.     Note:  This document was prepared using Dragon voice recognition software and may include unintentional dictation errors.    Faythe Ghee, PA-C 09/30/20 1229    Faythe Ghee, PA-C 09/30/20 1233    Delton [place], MD 09/30/20 [phone]

## 2020-09-30 NOTE — ED Triage Notes (Signed)
Pt reports her MD told her that her medication was no longer working and he thought the new one would help

## 2020-09-30 NOTE — ED Triage Notes (Signed)
Pt reports was at church and they had a health fair afterwards and when she had her BP checked it was 225/81 so she was advised to come to the ED. Pt denies all symptoms, denies pain, SOB or other concerns. Pt reports hx of HTN and has been taking her meds as prescribed. Pt reports her MD changed her BP meds but she has not started the new one yet.

## 2020-10-09 ENCOUNTER — Other Ambulatory Visit: Payer: Self-pay

## 2020-10-09 ENCOUNTER — Encounter (INDEPENDENT_AMBULATORY_CARE_PROVIDER_SITE_OTHER): Payer: Self-pay | Admitting: Vascular Surgery

## 2020-10-09 ENCOUNTER — Ambulatory Visit (INDEPENDENT_AMBULATORY_CARE_PROVIDER_SITE_OTHER): Payer: Medicare Other | Admitting: Vascular Surgery

## 2020-10-09 VITALS — BP 158/71 | HR 62 | Resp 16 | Wt 245.6 lb

## 2020-10-09 DIAGNOSIS — I10 Essential (primary) hypertension: Secondary | ICD-10-CM | POA: Diagnosis not present

## 2020-10-09 DIAGNOSIS — I890 Lymphedema, not elsewhere classified: Secondary | ICD-10-CM | POA: Diagnosis not present

## 2020-10-09 DIAGNOSIS — E785 Hyperlipidemia, unspecified: Secondary | ICD-10-CM | POA: Diagnosis not present

## 2020-10-09 NOTE — Progress Notes (Signed)
MRN : [id number]  Latoya Sloan is a 87 y.o. (May 06, 1932) female who presents with chief complaint of  Chief Complaint  Patient presents with  . Follow-up    6 month follow up  .  History of Present Illness: Patient returns today in follow up of leg swelling and lymphedema.  She has been trying to wear compression stockings, but they keep rolling down her legs and her swelling is just getting worse.  No open wounds or infection, but the legs are very heavy and swollen.  She has been trying to elevate her legs.  Current Outpatient Medications  Medication Sig Dispense Refill  . aspirin 81 MG chewable tablet Chew 81 mg by mouth daily.    Marland Kitchen atorvastatin (LIPITOR) 40 MG tablet Take 40 mg by mouth daily.    . carvedilol (COREG) 6.25 MG tablet Take 1 tablet (6.25 mg total) by mouth 2 (two) times daily. 60 tablet 11  . Cholecalciferol 50 MCG (2000 UT) CAPS Take 2,000 Units by mouth daily.    Marland Kitchen levothyroxine (SYNTHROID) 25 MCG tablet Take 25 mcg by mouth daily.    Marland Kitchen omeprazole (PRILOSEC) 40 MG capsule Take 40 mg by mouth daily.    Marland Kitchen telmisartan-hydrochlorothiazide (MICARDIS HCT) 80-25 MG tablet Take 1 tablet by mouth daily.    Marland Kitchen amLODipine (NORVASC) 5 MG tablet Take 5 mg by mouth daily.  (Patient not taking: No sig reported)    . furosemide (LASIX) 20 MG tablet Take 1 tablet (20 mg total) by mouth daily for 10 days. 10 tablet 0  . hydrochlorothiazide (HYDRODIURIL) 25 MG tablet Take 25 mg by mouth daily. (Patient not taking: Reported on 10/09/2020)    . losartan (COZAAR) 100 MG tablet Take 100 mg by mouth daily. (Patient not taking: No sig reported)    . metoprolol tartrate (LOPRESSOR) 25 MG tablet Take 25 mg by mouth daily. (Patient not taking: Reported on 10/09/2020)    . multivitamin-iron-minerals-folic acid (CENTRUM) chewable tablet Chew 1 tablet by mouth daily. (Patient not taking: Reported on 10/09/2020)    . potassium chloride (KLOR-CON) 10 MEQ tablet Take 1 tablet (10 mEq total) by mouth 2 (two)  times daily for 14 days. 28 tablet 0  . telmisartan (MICARDIS) 40 MG tablet Take by mouth. (Patient not taking: Reported on 10/09/2020)     No current facility-administered medications for this visit.    Past Medical History:  Diagnosis Date  . Hypertension   . Lymphedema of both lower extremities   . Thyroid disease     No past surgical history on file.   Social History   Tobacco Use  . Smoking status: Never Smoker  . Smokeless tobacco: Never Used  Substance Use Topics  . Alcohol use: Not Currently  . Drug use: Not Currently      Family History No bleeding or clotting disorders  No Known Allergies   REVIEW OF SYSTEMS (Negative unless checked)  Constitutional: [] Weight loss  [] Fever  [] Chills Cardiac: [] Chest pain   [] Chest pressure   [] Palpitations   [] Shortness of breath when laying flat   [] Shortness of breath at rest   [] Shortness of breath with exertion. Vascular:  [x] Pain in legs with walking   [x] Pain in legs at rest   [] Pain in legs when laying flat   [] Claudication   [] Pain in feet when walking  [] Pain in feet at rest  [] Pain in feet when laying flat   [] History of DVT   [] Phlebitis   [x] Swelling in legs   []   Varicose veins   [] Non-healing ulcers Pulmonary:   [] Uses home oxygen   [] Productive cough   [] Hemoptysis   [] Wheeze  [] COPD   [] Asthma Neurologic:  [] Dizziness  [] Blackouts   [] Seizures   [] History of stroke   [] History of TIA  [] Aphasia   [] Temporary blindness   [] Dysphagia   [] Weakness or numbness in arms   [] Weakness or numbness in legs Musculoskeletal:  [x] Arthritis   [] Joint swelling   [x] Joint pain   [] Low back pain Hematologic:  [] Easy bruising  [] Easy bleeding   [] Hypercoagulable state   [] Anemic   Gastrointestinal:  [] Blood in stool   [] Vomiting blood  [] Gastroesophageal reflux/heartburn   [] Abdominal pain Genitourinary:  [] Chronic kidney disease   [] Difficult urination  [] Frequent urination  [] Burning with urination   [] Hematuria Skin:  [] Rashes    [] Ulcers   [] Wounds Psychological:  [] History of anxiety   []  History of major depression.  Physical Examination  BP (!) 158/71 (BP Location: Right Arm)   Pulse 62   Resp 16   Wt 245 lb 9.6 oz (111.4 kg)   BMI 38.47 kg/m  Gen:  WD/WN, NAD Head: [place]/AT, No temporalis wasting. Ear/Nose/Throat: Hearing grossly intact, nares w/o erythema or drainage Eyes: Conjunctiva clear. Sclera non-icteric Neck: Supple.  Trachea midline Pulmonary:  Good air movement, no use of accessory muscles.  Cardiac: RRR, no JVD Vascular:  Vessel Right Left  Radial Palpable Palpable               Musculoskeletal: M/S 5/5 throughout.  No deformity or atrophy. 2-3+ BLE edema. Neurologic: Sensation grossly intact in extremities.  Symmetrical.  Speech is fluent.  Psychiatric: Judgment intact, Mood & affect appropriate for pt's clinical situation. Dermatologic: No rashes or ulcers noted.  No cellulitis or open wounds.       Labs Recent Results (from the past 2160 hour(s))  Basic metabolic panel     Status: Abnormal   Collection Time: 09/30/20 11:55 AM  Result Value Ref Range   Sodium 143 135 - 145 mmol/L   Potassium 3.8 3.5 - 5.1 mmol/L   Chloride 105 98 - 111 mmol/L   CO2 27 22 - 32 mmol/L   Glucose, Bld 110 (H) 70 - 99 mg/dL    Comment: Glucose reference range applies only to samples taken after fasting for at least 8 hours.   BUN 20 8 - 23 mg/dL   Creatinine, Ser 0.44 - 1.00 mg/dL   Calcium 9.4 8.9 - mg/dL   GFR, Estimated 59 (L) >60 mL/min    Comment: (NOTE) Calculated using the CKD-EPI Creatinine Equation (2021)    Anion gap 11 5 - 15    Comment: Performed at [facility], [address] [place],   CBC     Status: None   Collection Time: 09/30/20 11:55 AM  Result Value Ref Range   WBC 6.0 4.0 - 10.5 K/uL   RBC 4.83 3.87 - 5.11 MIL/uL   Hemoglobin 12.9 12.0 - 15.0 g/dL   HCT  - %   MCV 83.4 80.0 - 100.0 fL   MCH 26.7 26.0 - 34.0 pg    MCHC 32.0 30.0 - 36.0 g/dL   RDW  - %   Platelets 217 150 - 400 K/uL   nRBC 0.0 0.0 - 0.2 %    Comment: Performed at [facility], [address] [place],   Troponin I (High Sensitivity)     Status: None  Collection Time: 09/30/20 11:55 AM  Result Value Ref Range   Troponin I (High Sensitivity) 8 <18 ng/L    Comment: (NOTE) Elevated high sensitivity troponin I (hsTnI) values and significant  changes across serial measurements may suggest ACS but many other  chronic and acute conditions are known to elevate hsTnI results.  Refer to the "Links" section for chest pain algorithms and additional  guidance. Performed at [facility], [address], [place], [place] [address]     Radiology No results found.  Assessment/Plan  Lymphedema Patient has severe lymphedema which is at least stage II and may be stage III at this point.  She should begin using a lymphedema pump twice daily.  Her stockings are failing.  I prescribed her the juxta lite Velcro compression system and she should begin using this on a daily basis.  She should continue to elevate her legs and be as active as possible.  We will plan to see her back in about 3 months to assess her response to the new therapies.  Hyperlipidemia lipid control important in reducing the progression of atherosclerotic disease. Continue statin therapy   Essential hypertension blood pressure control important in reducing the progression of atherosclerotic disease. On appropriate oral medications.     Festus Barren, MD  10/10/2020 5:04 PM    This note was created with Dragon medical transcription system.  Any errors from dictation are purely unintentional

## 2020-10-10 NOTE — Assessment & Plan Note (Signed)
Patient has severe lymphedema which is at least stage II and may be stage III at this point.  She should begin using a lymphedema pump twice daily.  Her stockings are failing.  I prescribed her the juxta lite Velcro compression system and she should begin using this on a daily basis.  She should continue to elevate her legs and be as active as possible.  We will plan to see her back in about 3 months to assess her response to the new therapies.

## 2020-10-10 NOTE — Assessment & Plan Note (Signed)
lipid control important in reducing the progression of atherosclerotic disease. Continue statin therapy  

## 2020-10-10 NOTE — Assessment & Plan Note (Signed)
blood pressure control important in reducing the progression of atherosclerotic disease. On appropriate oral medications.  

## 2020-10-10 NOTE — Patient Instructions (Signed)
Lymphedema  Lymphedema is swelling that is caused by the abnormal collection of lymph in the tissues under the skin. Lymph is excess fluid from the tissues in your body that is removed through the lymphatic system. This system is part of your body's defense system (immune system) and includes lymph nodes and lymph vessels. The lymph vessels collect and carry the excess fluid, fats, proteins, and waste from the tissues of the body to the bloodstream. This system also works to clean and remove bacteria and waste products from the body. Lymphedema occurs when the lymphatic system is blocked. When the lymph vessels or lymph nodes are blocked or damaged, lymph does not drain properly. This causes an abnormal buildup of lymph, which leads to swelling in the affected area. This may include the trunk area, or an arm or leg. Lymphedema cannot be cured by medicines, but various methods can be used to help reduce the swelling. What are the causes? The cause of this condition depends on the type of lymphedema that you have.  Primary lymphedema is caused by the absence of lymph vessels or having abnormal lymph vessels at birth.  Secondary lymphedema occurs when lymph vessels are blocked or damaged. Secondary lymphedema is more common. Common causes of lymph vessel blockage include: ? Skin infection, such as cellulitis. ? Infection by parasites (filariasis). ? Injury. ? Radiation therapy. ? Cancer. ? Formation of scar tissue. ? Surgery. What are the signs or symptoms? Symptoms of this condition include:  Swelling of the arm or leg.  A heavy or tight feeling in the arm or leg.  Swelling of the feet, toes, or fingers. Shoes or rings may fit more tightly than before.  Redness of the skin over the affected area.  Limited movement of the affected limb.  Sensitivity to touch or discomfort in the affected limb. How is this diagnosed? This condition may be diagnosed based on:  Your symptoms and medical  history.  A physical exam.  Bioimpedance spectroscopy. In this test, painless electrical currents are used to measure fluid levels in your body.  Imaging tests, such as: ? MRI. ? CT scan. ? Duplex ultrasound. This test uses sound waves to produce images of the vessels and the blood flow on a screen. ? Lymphoscintigraphy. In this test, a low dose of a radioactive substance is injected to trace the flow of lymph through your lymph vessels. ? Lymphangiography. In this test, a contrast dye is injected into the lymph vessel to help show blockages. How is this treated? If an underlying condition is causing the lymphedema, that condition will be treated. For example, antibiotic medicines may be used to treat an infection. Treatment for this condition will depend on the cause of your lymphedema. Treatment may include:  Complete decongestive therapy (CDT). This is done by a certified lymphedema therapist to reduce fluid congestion. This therapy includes: ? Skin care. ? Compression wrapping of the affected area. ? Manual lymph drainage. This is a special massage technique that promotes lymph drainage out of a limb. ? Specific exercises. Certain exercises can help fluid move out of the affected limb.  Compression. Various methods may be used to apply pressure to the affected limb to reduce the swelling. They include: ? Wearing compression stockings or sleeves on the affected limb. ? Wrapping the affected limb with special bandages.  Surgery. This is usually done for severe cases only. For example, surgery may be done if you have trouble moving the limb or if the swelling does not   get better with other treatments.   Follow these instructions at home: Self-care  The affected area is more likely to become injured or infected. Take these steps to help prevent infection: ? Keep the affected area clean and dry. ? Use approved creams or lotions to keep the skin moisturized. ? Protect your skin from  cuts:  Use gloves while cooking or gardening.  Do not walk barefoot.  If you shave the affected area, use an electric razor.  Do not wear tight clothes, shoes, or jewelry.  Eat a healthy diet that includes a lot of fruits and vegetables. Activity  Do exercises as told by your health care provider.  Do not sit with your legs crossed.  When possible, keep the affected limb raised (elevated) above the level of your heart.  Avoid carrying things with an arm that is affected by lymphedema. General instructions  Wear compression stockings or sleeves as told by your health care provider.  Note any changes in size of the affected limb. You may be instructed to take regular measurements and keep track of them.  Take over-the-counter and prescription medicines only as told by your health care provider.  If you were prescribed an antibiotic medicine, take or apply it as told by your health care provider. Do not stop using the antibiotic even if you start to feel better or if your condition improves.  Do not use heating pads or ice packs on the affected area.  Avoid having blood draws, IV insertions, or blood pressure checks on the affected limb.  Keep all follow-up visits. This is important. Contact a health care provider if you:  Continue to have swelling in your limb.  Have fluid leaking from the skin of your swollen limb.  Have a cut that does not heal.  Have redness or pain in the affected area.  Develop purplish spots, rash, blisters, or sores (lesions) on your affected limb. Get help right away if you:  Have new swelling in your limb that starts suddenly.  Have shortness of breath or chest pain.  Have a fever or chills. These symptoms may represent a serious problem that is an emergency. Do not wait to see if the symptoms will go away. Get medical help right away. Call your local emergency services (911 in the [place] Do not drive yourself to the  hospital. Summary  Lymphedema is swelling that is caused by the abnormal collection of lymph in the tissues under the skin.  Lymph is fluid from the tissues in your body that is removed through the lymphatic system. This system collects and carries excess fluid, fats, proteins, and wastes from the tissues of the body to the bloodstream.  Lymphedema causes swelling, pain, and redness in the affected area. This may include the trunk area, or an arm or leg.  Treatment for this condition may depend on the cause of your lymphedema. Treatment may include treating the underlying cause, complete decongestive therapy (CDT), compression methods, or surgery. This information is not intended to replace advice given to you by your health care provider. Make sure you discuss any questions you have with your health care provider. Document Revised: 04/18/2020 Document Reviewed: 04/18/2020 [employer] Patient Education  2021 [employer]  

## 2020-12-08 ENCOUNTER — Other Ambulatory Visit: Payer: Self-pay

## 2020-12-08 ENCOUNTER — Encounter: Payer: Self-pay | Admitting: Emergency Medicine

## 2020-12-08 ENCOUNTER — Emergency Department
Admission: EM | Admit: 2020-12-08 | Discharge: 2020-12-08 | Disposition: A | Discharge status: Home / Self Care | Payer: Medicare Other | Attending: Emergency Medicine | Admitting: Emergency Medicine

## 2020-12-08 ENCOUNTER — Emergency Department: Payer: Medicare Other

## 2020-12-08 DIAGNOSIS — I110 Hypertensive heart disease with heart failure: Secondary | ICD-10-CM | POA: Diagnosis not present

## 2020-12-08 DIAGNOSIS — Z7982 Long term (current) use of aspirin: Secondary | ICD-10-CM | POA: Insufficient documentation

## 2020-12-08 DIAGNOSIS — Z20822 Contact with and (suspected) exposure to covid-19: Secondary | ICD-10-CM | POA: Insufficient documentation

## 2020-12-08 DIAGNOSIS — I5032 Chronic diastolic (congestive) heart failure: Secondary | ICD-10-CM | POA: Insufficient documentation

## 2020-12-08 DIAGNOSIS — E876 Hypokalemia: Secondary | ICD-10-CM

## 2020-12-08 DIAGNOSIS — J181 Lobar pneumonia, unspecified organism: Secondary | ICD-10-CM | POA: Diagnosis not present

## 2020-12-08 DIAGNOSIS — Z79899 Other long term (current) drug therapy: Secondary | ICD-10-CM | POA: Diagnosis not present

## 2020-12-08 DIAGNOSIS — J189 Pneumonia, unspecified organism: Secondary | ICD-10-CM

## 2020-12-08 DIAGNOSIS — R059 Cough, unspecified: Secondary | ICD-10-CM | POA: Diagnosis present

## 2020-12-08 LAB — CBC WITH DIFFERENTIAL/PLATELET
Abs Immature Granulocytes: 0.04 10*3/uL (ref 0.00–0.07)
Basophils Absolute: 0 10*3/uL (ref 0.0–0.1)
Basophils Relative: 0 %
Eosinophils Absolute: 0.1 10*3/uL (ref 0.0–0.5)
Eosinophils Relative: 2 %
HCT: 33.4 % — ABNORMAL LOW (ref 36.0–46.0)
Hemoglobin: 11.1 g/dL — ABNORMAL LOW (ref 12.0–15.0)
Immature Granulocytes: 0 %
Lymphocytes Relative: 17 %
Lymphs Abs: 1.5 10*3/uL (ref 0.7–4.0)
MCH: 27.4 pg (ref 26.0–34.0)
MCHC: 33.2 g/dL (ref 30.0–36.0)
MCV: 82.5 fL (ref 80.0–100.0)
Monocytes Absolute: 1.1 10*3/uL — ABNORMAL HIGH (ref 0.1–1.0)
Monocytes Relative: 12 %
Neutro Abs: 6.2 10*3/uL (ref 1.7–7.7)
Neutrophils Relative %: 69 %
Platelets: 191 10*3/uL (ref 150–400)
RBC: 4.05 MIL/uL (ref 3.87–5.11)
RDW: 15.3 % (ref 11.5–15.5)
WBC: 9 10*3/uL (ref 4.0–10.5)
nRBC: 0 % (ref 0.0–0.2)

## 2020-12-08 LAB — COMPREHENSIVE METABOLIC PANEL
ALT: 11 U/L (ref 0–44)
AST: 16 U/L (ref 15–41)
Albumin: 3.6 g/dL (ref 3.5–5.0)
Alkaline Phosphatase: 70 U/L (ref 38–126)
Anion gap: 12 (ref 5–15)
BUN: 12 mg/dL (ref 8–23)
CO2: 25 mmol/L (ref 22–32)
Calcium: 8.9 mg/dL (ref 8.9–10.3)
Chloride: 103 mmol/L (ref 98–111)
Creatinine, Ser: 0.95 mg/dL (ref 0.44–1.00)
GFR, Estimated: 58 mL/min — ABNORMAL LOW (ref >60)
Glucose, Bld: 106 mg/dL — ABNORMAL HIGH (ref 70–99)
Potassium: 2.9 mmol/L — ABNORMAL LOW (ref 3.5–5.1)
Sodium: 140 mmol/L (ref 135–145)
Total Bilirubin: 1 mg/dL (ref 0.3–1.2)
Total Protein: 6.6 g/dL (ref 6.5–8.1)

## 2020-12-08 LAB — URINALYSIS, COMPLETE (UACMP) WITH MICROSCOPIC
Bilirubin Urine: NEGATIVE
Glucose, UA: NEGATIVE mg/dL
Hgb urine dipstick: NEGATIVE
Ketones, ur: NEGATIVE mg/dL
Leukocytes,Ua: NEGATIVE
Nitrite: NEGATIVE
Protein, ur: NEGATIVE mg/dL
Specific Gravity, Urine: 1.004 — ABNORMAL LOW (ref 1.005–1.030)
WBC, UA: NONE SEEN WBC/hpf (ref 0–5)
pH: 7 (ref 5.0–8.0)

## 2020-12-08 LAB — RESP PANEL BY RT-PCR (FLU A&B, COVID) ARPGX2
Influenza A by PCR: NEGATIVE
Influenza B by PCR: NEGATIVE
SARS Coronavirus 2 by RT PCR: NEGATIVE

## 2020-12-08 MED ORDER — POTASSIUM CHLORIDE 20 MEQ PO PACK
40.0000 meq | PACK | Freq: Two times a day (BID) | ORAL | Status: DC
  Administered 2020-12-08: 40 meq via ORAL
  Filled 2020-12-08: qty 2

## 2020-12-08 MED ORDER — BENZONATATE 100 MG PO CAPS
100.0000 mg | ORAL_CAPSULE | Freq: Once | ORAL | Status: AC
  Administered 2020-12-08: 100 mg via ORAL
  Filled 2020-12-08: qty 1

## 2020-12-08 MED ORDER — AZITHROMYCIN 250 MG PO TABS: ORAL_TABLET | ORAL | 0 refills | Status: DC

## 2020-12-08 MED ORDER — BENZONATATE 100 MG PO CAPS: 100.0000 mg | ORAL_CAPSULE | Freq: Three times a day (TID) | ORAL | 0 refills | Status: DC | PRN

## 2020-12-08 MED ORDER — AMOXICILLIN-POT CLAVULANATE 875-125 MG PO TABS: 1.0000 | ORAL_TABLET | Freq: Two times a day (BID) | ORAL | 0 refills | Status: AC

## 2020-12-08 MED ORDER — POTASSIUM CHLORIDE ER 10 MEQ PO TBCR: 10.0000 meq | EXTENDED_RELEASE_TABLET | Freq: Two times a day (BID) | ORAL | 0 refills | Status: DC

## 2020-12-08 NOTE — ED Provider Notes (Signed)
[facility] Emergency Department Provider Note  ____________________________________________   Event Date/Time   First MD Initiated Contact with Patient 12/08/20 [phone]     (approximate)  I have reviewed the triage vital signs and the nursing notes.   HISTORY  Chief Complaint Cough, Hoarse, and Sore Throat   HPI Latoya Sloan is a 87 y.o. female presents to the ED with complaint of cough for the last 5 days which is caused her throat to be sore and hoarseness to her voice.  Patient is unaware of any fever and denies any difficulty breathing.  She reports that she has not taken her blood pressure medication today as it hurts to swallow and normally she does not take her blood pressure medication until 8 AM anyway.  Patient lives in a retirement facility.  She is unaware of any known COVID exposure.  Patient has had vaccination for COVID.  Patient denies any headache, dizziness, shortness of breath or chest pain.  He rates his pain as 10/10.       Past Medical History:  Diagnosis Date  . Hypertension   . Lymphedema of both lower extremities   . Thyroid disease     Patient Active Problem List   Diagnosis Date Noted  . Chronic diastolic CHF (congestive heart failure), NYHA class 2 (HCC) 04/06/2020  . Leg numbness 03/20/2020  . Leg swelling 03/20/2020  . Lymphedema of both lower extremities 03/20/2020  . Anosmia 03/06/2020  . Bilateral carotid artery stenosis 03/06/2020  . Chronic cough 03/06/2020  . Dyspnea on exertion 03/06/2020  . GERD (gastroesophageal reflux disease) 03/06/2020  . Hyperlipidemia 03/06/2020  . Lymphedema 03/06/2020  . Migraines 03/06/2020  . Osteoarthritis of both knees 03/06/2020  . Idiopathic peripheral neuropathy 12/29/2013  . Localized, primary osteoarthritis 12/28/2013  . Osteoarthritis of knee 12/28/2013  . Essential hypertension 10/23/2010  . Family history of stroke 10/22/2010    History reviewed. No pertinent surgical  history.  Prior to Admission medications   Medication Sig Start Date End Date Taking? Authorizing Provider  amoxicillin-clavulanate (AUGMENTIN) 875-125 MG tablet Take 1 tablet by mouth 2 (two) times daily for 10 days. 12/08/20 12/18/20 Yes Rosanna Bickle L, PA-C  azithromycin (ZITHROMAX Z-PAK) 250 MG tablet Take 2 tablets (500 mg) on  Day 1,  followed by 1 tablet (250 mg) once daily on Days 2 through 5. 12/08/20  Yes Levada Schilling, Schylar Allard L, PA-C  benzonatate (TESSALON PERLES) 100 MG capsule Take 1 capsule (100 mg total) by mouth 3 (three) times daily as needed for cough. 12/08/20 12/08/21 Yes Raeya Merritts L, PA-C  amLODipine (NORVASC) 5 MG tablet Take 5 mg by mouth daily.  Patient not taking: No sig reported    [provider]  aspirin 81 MG chewable tablet Chew 81 mg by mouth daily.    [provider]  atorvastatin (LIPITOR) 40 MG tablet Take 40 mg by mouth daily.    [provider]  carvedilol (COREG) 6.25 MG tablet Take 1 tablet (6.25 mg total) by mouth 2 (two) times daily. 09/30/20 09/30/21  Sherrie Mustache Roselyn Bering, PA-C  Cholecalciferol 50 MCG (2000 UT) CAPS Take 2,000 Units by mouth daily.    [provider]  furosemide (LASIX) 20 MG tablet Take 1 tablet (20 mg total) by mouth daily for 10 days. 03/02/20 03/12/20  Sharman Cheek, MD  hydrochlorothiazide (HYDRODIURIL) 25 MG tablet Take 25 mg by mouth daily. Patient not taking: Reported on 10/09/2020    [provider]  levothyroxine (SYNTHROID) 25  MCG tablet Take 25 mcg by mouth daily.    [provider]  losartan (COZAAR) 100 MG tablet Take 100 mg by mouth daily. Patient not taking: No sig reported    [provider]  metoprolol tartrate (LOPRESSOR) 25 MG tablet Take 25 mg by mouth daily. Patient not taking: Reported on 10/09/2020    [provider]  multivitamin-iron-minerals-folic acid (CENTRUM) chewable tablet Chew 1 tablet by mouth daily. Patient not taking: Reported on 10/09/2020     [provider]  omeprazole (PRILOSEC) 40 MG capsule Take 40 mg by mouth daily.    [provider]  potassium chloride (KLOR-CON) 10 MEQ tablet Take 1 tablet (10 mEq total) by mouth 2 (two) times daily for 10 days. 12/08/20 12/18/20  Tommi RumpsSummers, Syrus Nakama L, PA-C  telmisartan (MICARDIS) 40 MG tablet Take by mouth. Patient not taking: Reported on 10/09/2020 04/06/20 04/06/21  [provider]  telmisartan-hydrochlorothiazide (MICARDIS HCT) 80-25 MG tablet Take 1 tablet by mouth daily. 10/01/20 10/01/21  [provider]    Allergies Patient has no known allergies.  No family history on file.  Social History Social History   Tobacco Use  . Smoking status: Never Smoker  . Smokeless tobacco: Never Used  Substance Use Topics  . Alcohol use: Not Currently  . Drug use: Not Currently    Review of Systems Constitutional: Denies fever/chills Eyes: No visual changes. ENT: Positive sore throat. Cardiovascular: Denies chest pain. Respiratory: Denies shortness of breath.  Positive for cough. Gastrointestinal: No abdominal pain.  No nausea, no vomiting.  No diarrhea.  Musculoskeletal: Negative for musculoskeletal pain. Skin: Negative for rash. Neurological: Negative for headaches, focal weakness or numbness. ____________________________________________   PHYSICAL EXAM:  VITAL SIGNS: ED Triage Vitals  Enc Vitals Group     BP 12/08/20 0750 (!) 227/75     Pulse Rate 12/08/20 0750 79     Resp 12/08/20 0750 20     Temp 12/08/20 0749 98.4 F (36.9 C)     Temp Source 12/08/20 0749 Oral     SpO2 12/08/20 0750 99 %     Weight 12/08/20 0749 250 lb (113.4 kg)     Height 12/08/20 0749 5\' 7"  (1.702 m)     Head Circumference --      Peak Flow --      Pain Score 12/08/20 0749 10     Pain Loc --      Pain Edu? --      Excl. in GC? --    Constitutional: Alert and oriented. Well appearing and in no acute distress. Eyes: Conjunctivae are normal. PERRL. EOMI. Head:  Atraumatic. Nose: Mild congestion/rhinnorhea. Mouth/Throat: Mucous membranes are moist.  Oropharynx non-erythematous.  No exudate present.  Uvula is midline.  Mild injection noted posterior pharynx. Neck: No stridor.  Supple without adenopathy. Cardiovascular: Normal rate, regular rhythm. Grossly normal heart sounds.  Good peripheral circulation. Respiratory: Normal respiratory effort.  No retractions. Lungs CTAB.  Patient with frequent cough. Gastrointestinal: Soft and nontender. No distention.  Neurologic:  Normal speech and language. No gross focal neurologic deficits are appreciated.  Skin:  Skin is warm, dry and intact. No rash noted. Psychiatric: Mood and affect are normal. Speech and behavior are normal.  ____________________________________________   LABS (all labs ordered are listed, but only abnormal results are displayed)  Labs Reviewed  COMPREHENSIVE METABOLIC PANEL - Abnormal; Notable for the following components:      Result Value   Potassium 2.9 (*)    Glucose, Bld  106 (*)    GFR, Estimated 58 (*)    All other components within normal limits  URINALYSIS, COMPLETE (UACMP) WITH MICROSCOPIC - Abnormal; Notable for the following components:   Color, Urine STRAW (*)    APPearance CLEAR (*)    Specific Gravity, Urine 1.004 (*)    Bacteria, UA RARE (*)    All other components within normal limits  CBC WITH DIFFERENTIAL/PLATELET - Abnormal; Notable for the following components:   Hemoglobin 11.1 (*)    HCT 33.4 (*)    Monocytes Absolute 1.1 (*)    All other components within normal limits  RESP PANEL BY RT-PCR (FLU A&B, COVID) [id number]  CBC WITH DIFFERENTIAL/PLATELET   ____________________________________________  __________________________________________  RADIOLOGY Beaulah Corin, personally viewed and evaluated these images (plain radiographs) as part of my medical decision making, as well as reviewing the written report by the radiologist.   Official  radiology report(s): DG Chest Port 1 View  Result Date: 12/08/2020 CLINICAL DATA:  Persistent cough EXAM: PORTABLE CHEST 1 VIEW COMPARISON:  March 01, 2020 FINDINGS: There is cardiomegaly with pulmonary vascularity normal. There is ill-defined airspace opacity in each lung base without consolidation. No adenopathy. There is aortic atherosclerosis. No bone lesions. IMPRESSION: Ill-defined airspace opacity in the lung bases concerning for bibasilar pneumonia. A degree of interstitial pulmonary edema could present in this manner. The appearance of the lung bases raises concern for potential atypical organism pneumonia. Check of COVID-19 status advised. Cardiomegaly with pulmonary vascularity normal. Aortic Atherosclerosis (ICD10-I70.0). Electronically Signed   By: Bretta Bang III M.D.   On: 12/08/2020 09:04    ____________________________________________   PROCEDURES  Procedure(s) performed (including Critical Care):  Procedures   ____________________________________________   INITIAL IMPRESSION / ASSESSMENT AND PLAN / ED COURSE  As part of my medical decision making, I reviewed the following data within the electronic MEDICAL RECORD NUMBER Notes from prior ED visits and [place] Controlled Substance Database  87 year old female presents to the ED with complaint of cough for approximately 5 days which has caused her throat to become scratchy and hoarseness.  She is unaware of any fever.  Currently she lives in a retirement community and is unaware of any COVID exposure.  She herself has been vaccinated.  Chest x-ray showed possible early bibasilar pneumonia.  Respiratory panel was negative for COVID and influenza.  Potassium was noted to be decreased at 2.9 and patient has a history of hypokalemia but currently is not taking potassium.  Family is present and instructions for discussed with patient and daughter.  After discussing with Dr. Cyril Loosen, patient was placed on Zithromax, Augmentin 875, Tessalon  Perles and potassium chloride prescription she had previously was renewed.  Patient is to follow-up with her PCP next week.  She is strongly advised to return to the emergency department immediately should she develop any sudden worsening, elevated fever, chills, shortness of breath or difficulty breathing.  Patient and daughter agree to this plan.  ____________________________________________   FINAL CLINICAL IMPRESSION(S) / ED DIAGNOSES  Final diagnoses:  Pneumonia of both lower lobes due to infectious organism  Hypokalemia     ED Discharge Orders         Ordered    benzonatate (TESSALON PERLES) 100 MG capsule  3 times daily PRN        12/08/20 1058    azithromycin (ZITHROMAX Z-PAK) 250 MG tablet        12/08/20 1058    amoxicillin-clavulanate (AUGMENTIN) 875-125 MG tablet  2  times daily        12/08/20 1058    potassium chloride (KLOR-CON) 10 MEQ tablet  2 times daily        12/08/20 1058           Note:  This document was prepared using Dragon voice recognition software and may include unintentional dictation errors.    Tommi Rumps, PA-C 12/08/20 1513    Jene Every, MD 12/10/20 [phone]

## 2020-12-08 NOTE — ED Triage Notes (Signed)
Pt reports cough since Monday and a hoarseness to her voice and then Thursday her throat got sore. Pt denies fevers. Pt reports painful to swallow.

## 2020-12-08 NOTE — ED Notes (Signed)
Assisted pt to the restroom. Minimal assistance.

## 2020-12-08 NOTE — Discharge Instructions (Addendum)
Follow-up with your primary care provider next week for a recheck.  Return to the emergency department immediately if any worsening of your symptoms such as high fever, chills, nausea, vomiting or inability to take your medication.  Return if any shortness of breath or difficulty breathing.  Today your chest x-ray showed early bilateral pneumonia.  Influenza and COVID test were negative.  Your potassium also was low and you were given the first dose of your potassium while in the emergency department.  A prescription for Tessalon Perles was sent to the pharmacy which is for cough.  This medication will not cause drowsiness.  Take your blood pressure medication every day.

## 2021-01-08 ENCOUNTER — Ambulatory Visit (INDEPENDENT_AMBULATORY_CARE_PROVIDER_SITE_OTHER): Payer: Medicare Other | Admitting: Vascular Surgery

## 2021-01-16 DIAGNOSIS — R001 Bradycardia, unspecified: Secondary | ICD-10-CM | POA: Insufficient documentation

## 2021-01-22 ENCOUNTER — Other Ambulatory Visit: Payer: Self-pay

## 2021-01-22 ENCOUNTER — Ambulatory Visit (INDEPENDENT_AMBULATORY_CARE_PROVIDER_SITE_OTHER): Payer: Medicare Other | Admitting: Vascular Surgery

## 2021-01-22 VITALS — BP 159/62 | HR 71 | Ht 67.0 in | Wt 257.0 lb

## 2021-01-22 DIAGNOSIS — E785 Hyperlipidemia, unspecified: Secondary | ICD-10-CM

## 2021-01-22 DIAGNOSIS — I890 Lymphedema, not elsewhere classified: Secondary | ICD-10-CM

## 2021-01-22 DIAGNOSIS — I10 Essential (primary) hypertension: Secondary | ICD-10-CM

## 2021-01-22 NOTE — Assessment & Plan Note (Signed)
The patient's lymphedema is under fair control.  I gave her a new prescription today for some lower grade compression socks to wear under the Velcro compression system.  This may cause a little less issue around her ankle.  Walk as much as possible and she is currently doing physical therapy.  Elevate her legs as tolerated.  Continue to use a lymphedema pump.  Return in 6 months.

## 2021-01-22 NOTE — Progress Notes (Signed)
MRN : [id number]  Latoya Sloan is a 87 y.o. (1932/01/08) female who presents with chief complaint of  Chief Complaint  Patient presents with   Follow-up    3 Mo no studies  .  History of Present Illness: Patient returns today in follow up of her lymphedema and leg swelling.  She continues to have leg swelling although her lower part of her leg is actually a little bit improved today.  She is most bothered by irritation right around the ankle where the socks and the Velcro compression system seem to do again a bit.  She still has swelling in the upper legs.  No new wounds or infection.  No fevers or chills.  Right leg is still the more severely affected of the 2 legs.  Using lymphedema pump  Current Outpatient Medications  Medication Sig Dispense Refill   aspirin 81 MG chewable tablet Chew 81 mg by mouth daily.     atorvastatin (LIPITOR) 40 MG tablet Take 40 mg by mouth daily.     carvedilol (COREG) 6.25 MG tablet Take 1 tablet (6.25 mg total) by mouth 2 (two) times daily. 60 tablet 11   Cholecalciferol 50 MCG (2000 UT) CAPS Take 2,000 Units by mouth daily.     levothyroxine (SYNTHROID) 25 MCG tablet Take 25 mcg by mouth daily.     telmisartan-hydrochlorothiazide (MICARDIS HCT) 80-25 MG tablet Take 1 tablet by mouth daily.     amLODipine (NORVASC) 5 MG tablet Take 5 mg by mouth daily. (Patient not taking: Reported on 01/22/2021)     azithromycin (ZITHROMAX Z-PAK) 250 MG tablet Take 2 tablets (500 mg) on  Day 1,  followed by 1 tablet (250 mg) once daily on Days 2 through 5. (Patient not taking: Reported on 01/22/2021) 6 each 0   benzonatate (TESSALON PERLES) 100 MG capsule Take 1 capsule (100 mg total) by mouth 3 (three) times daily as needed for cough. (Patient not taking: Reported on 01/22/2021) 30 capsule 0   furosemide (LASIX) 20 MG tablet Take 1 tablet (20 mg total) by mouth daily for 10 days. 10 tablet 0   hydrochlorothiazide (HYDRODIURIL) 25 MG tablet Take 25 mg by mouth daily. (Patient  not taking: Reported on 01/22/2021)     losartan (COZAAR) 100 MG tablet Take 100 mg by mouth daily. (Patient not taking: Reported on 01/22/2021)     metoprolol tartrate (LOPRESSOR) 25 MG tablet Take 25 mg by mouth daily. (Patient not taking: Reported on 01/22/2021)     multivitamin-iron-minerals-folic acid (CENTRUM) chewable tablet Chew 1 tablet by mouth daily. (Patient not taking: Reported on 01/22/2021)     omeprazole (PRILOSEC) 40 MG capsule Take 40 mg by mouth daily. (Patient not taking: Reported on 01/22/2021)     potassium chloride (KLOR-CON) 10 MEQ tablet Take 1 tablet (10 mEq total) by mouth 2 (two) times daily for 10 days. 20 tablet 0   telmisartan (MICARDIS) 40 MG tablet Take by mouth. (Patient not taking: Reported on 01/22/2021)     No current facility-administered medications for this visit.    Past Medical History:  Diagnosis Date   Hypertension    Lymphedema of both lower extremities    Thyroid disease     No past surgical history on file.   Social History   Tobacco Use   Smoking status: Never   Smokeless tobacco: Never  Substance Use Topics   Alcohol use: Not Currently   Drug use: Not Currently      No family history  on file.   No Known Allergies  REVIEW OF SYSTEMS (Negative unless checked)   Constitutional: [] Weight loss  [] Fever  [] Chills Cardiac: [] Chest pain   [] Chest pressure   [] Palpitations   [] Shortness of breath when laying flat   [] Shortness of breath at rest   [] Shortness of breath with exertion. Vascular:  [x] Pain in legs with walking   [x] Pain in legs at rest   [] Pain in legs when laying flat   [] Claudication   [] Pain in feet when walking  [] Pain in feet at rest  [] Pain in feet when laying flat   [] History of DVT   [] Phlebitis   [x] Swelling in legs   [] Varicose veins   [] Non-healing ulcers Pulmonary:   [] Uses home oxygen   [] Productive cough   [] Hemoptysis   [] Wheeze  [] COPD   [] Asthma Neurologic:  [] Dizziness  [] Blackouts   [] Seizures   [] History of  stroke   [] History of TIA  [] Aphasia   [] Temporary blindness   [] Dysphagia   [] Weakness or numbness in arms   [] Weakness or numbness in legs Musculoskeletal:  [x] Arthritis   [] Joint swelling   [x] Joint pain   [] Low back pain Hematologic:  [] Easy bruising  [] Easy bleeding   [] Hypercoagulable state   [] Anemic   Gastrointestinal:  [] Blood in stool   [] Vomiting blood  [] Gastroesophageal reflux/heartburn   [] Abdominal pain Genitourinary:  [] Chronic kidney disease   [] Difficult urination  [] Frequent urination  [] Burning with urination   [] Hematuria Skin:  [] Rashes   [] Ulcers   [] Wounds Psychological:  [] History of anxiety   []  History of major depression.  Physical Examination  BP (!) 159/62   Pulse 71   Ht 5\' 7"  (1.702 m)   Wt 257 lb (116.6 kg)   BMI 40.25 kg/m  Gen:  WD/WN, NAD.  Obese Head: Bow[place]/AT, No temporalis wasting. Ear/Nose/Throat: Hearing grossly intact, nares w/o erythema or drainage Eyes: Conjunctiva clear. Sclera non-icteric Neck: Supple.  Trachea midline Pulmonary:  Good air movement, no use of accessory muscles.  Cardiac: RRR, no JVD Vascular:  Vessel Right Left  Radial Palpable Palpable           Musculoskeletal: M/S 5/5 throughout.  No deformity or atrophy.  2+ bilateral lower extremity edema. Neurologic: Sensation grossly intact in extremities.  Symmetrical.  Speech is fluent.  Psychiatric: Judgment intact, Mood & affect appropriate for pt's clinical situation. Dermatologic: No rashes or ulcers noted.  No cellulitis or open wounds.      Labs Recent Results (from the past 2160 hour(s))  Resp Panel by RT-PCR (Flu A&B, Covid) Nasopharyngeal Swab     Status: None   Collection Time: 12/08/20  8:33 AM   Specimen: Nasopharyngeal Swab; Nasopharyngeal(NP) swabs in vial transport medium  Result Value Ref Range   SARS Coronavirus 2 by RT PCR NEGATIVE NEGATIVE    Comment: (NOTE) SARS-CoV-2 target nucleic acids are NOT DETECTED.  The SARS-CoV-2 RNA is generally  detectable in upper respiratory specimens during the acute phase of infection. The lowest concentration of SARS-CoV-2 viral copies this assay can detect is 138 copies/mL. A negative result does not preclude SARS-Cov-2 infection and should not be used as the sole basis for treatment or other patient management decisions. A negative result may occur with  improper specimen collection/handling, submission of specimen other than nasopharyngeal swab, presence of viral mutation(s) within the areas targeted by this assay, and inadequate number of viral copies(<138 copies/mL). A negative result must be combined with clinical observations, patient history, and epidemiological information. The expected  result is Negative.  Fact Sheet for Patients:  BloggerCourse.com  Fact Sheet for Healthcare Providers:  SeriousBroker.it  This test is no t yet approved or cleared by the [place] FDA and  has been authorized for detection and/or diagnosis of SARS-CoV-2 by FDA under an Emergency Use Authorization (EUA). This EUA will remain  in effect (meaning this test can be used) for the duration of the COVID-19 declaration under Section 564(b)(1) of the Act, 21 U.S.C.section 360bbb-3(b)(1), unless the authorization is terminated  or revoked sooner.       Influenza A by PCR NEGATIVE NEGATIVE   Influenza B by PCR NEGATIVE NEGATIVE    Comment: (NOTE) The Xpert Xpress SARS-CoV-2/FLU/RSV plus assay is intended as an aid in the diagnosis of influenza from Nasopharyngeal swab specimens and should not be used as a sole basis for treatment. Nasal washings and aspirates are unacceptable for Xpert Xpress SARS-CoV-2/FLU/RSV testing.  Fact Sheet for Patients: BloggerCourse.com  Fact Sheet for Healthcare Providers: SeriousBroker.it  This test is not yet approved or cleared by the [place] FDA and has  been authorized for detection and/or diagnosis of SARS-CoV-2 by FDA under an Emergency Use Authorization (EUA). This EUA will remain in effect (meaning this test can be used) for the duration of the COVID-19 declaration under Section 564(b)(1) of the Act, 21 U.S.C. section 360bbb-3(b)(1), unless the authorization is terminated or revoked.  Performed at [facility], [address] [place], [place] [address]   Comprehensive metabolic panel     Status: Abnormal   Collection Time: 12/08/20  8:45 AM  Result Value Ref Range   Sodium 140 135 - 145 mmol/L   Potassium 2.9 (L) 3.5 - 5.1 mmol/L   Chloride 103 98 - 111 mmol/L   CO2 25 22 - 32 mmol/L   Glucose, Bld 106 (H) 70 - 99 mg/dL    Comment: Glucose reference range applies only to samples taken after fasting for at least 8 hours.   BUN 12 8 - 23 mg/dL   Creatinine, Ser 0.53 0.44 - 1.00 mg/dL   Calcium 8.9 8.9 - 97.6 mg/dL   Total Protein 6.6 6.5 - 8.1 g/dL   Albumin 3.6 3.5 - 5.0 g/dL   AST 16 15 - 41 U/L   ALT 11 0 - 44 U/L   Alkaline Phosphatase 70 38 - 126 U/L   Total Bilirubin 1.0 0.3 - 1.2 mg/dL   GFR, Estimated 58 (L) >60 mL/min    Comment: (NOTE) Calculated using the CKD-EPI Creatinine Equation (2021)    Anion gap 12 5 - 15    Comment: Performed at [facility], [address] [place], [place] [address]  Urinalysis, Complete w Microscopic Urine, Clean Catch     Status: Abnormal   Collection Time: 12/08/20  9:09 AM  Result Value Ref Range   Color, Urine STRAW (A) YELLOW   APPearance CLEAR (A) CLEAR   Specific Gravity, Urine 1.004 (L) 1.005 - 1.030   pH 7.0 5.0 - 8.0   Glucose, UA NEGATIVE NEGATIVE mg/dL   Hgb urine dipstick NEGATIVE NEGATIVE   Bilirubin Urine NEGATIVE NEGATIVE   Ketones, ur NEGATIVE NEGATIVE mg/dL   Protein, ur NEGATIVE NEGATIVE mg/dL   Nitrite NEGATIVE NEGATIVE   Leukocytes,Ua NEGATIVE NEGATIVE   RBC / HPF 0-5 0 - 5 RBC/hpf   WBC, UA NONE SEEN 0 - 5 WBC/hpf   Bacteria, UA RARE  (A) NONE SEEN   Squamous Epithelial / LPF 0-5 0 - 5  Comment: Performed at [facility], [address] [place], [place] [address]  CBC with Differential/Platelet     Status: Abnormal   Collection Time: 12/08/20  9:53 AM  Result Value Ref Range   WBC 9.0 4.0 - 10.5 K/uL   RBC 4.05 3.87 - 5.11 MIL/uL   Hemoglobin 11.1 (L) 12.0 - 15.0 g/dL   HCT 60.433.4 (L) 54.036.0 - 98.146.0 %   MCV 82.5 80.0 - 100.0 fL   MCH 27.4 26.0 - 34.0 pg   MCHC 33.2 30.0 - 36.0 g/dL   RDW 19.115.3 47.811.5 - 29.515.5 %   Platelets 191 150 - 400 K/uL   nRBC 0.0 0.0 - 0.2 %   Neutrophils Relative % 69 %   Neutro Abs 6.2 1.7 - 7.7 K/uL   Lymphocytes Relative 17 %   Lymphs Abs 1.5 0.7 - 4.0 K/uL   Monocytes Relative 12 %   Monocytes Absolute 1.1 (H) 0.1 - 1.0 K/uL   Eosinophils Relative 2 %   Eosinophils Absolute 0.1 0.0 - 0.5 K/uL   Basophils Relative 0 %   Basophils Absolute 0.0 0.0 - 0.1 K/uL   Immature Granulocytes 0 %   Abs Immature Granulocytes 0.04 0.00 - 0.07 K/uL    Comment: Performed at [facility], [address] [place], [place] [address]    Radiology No results found.  Assessment/Plan Hyperlipidemia lipid control important in reducing the progression of atherosclerotic disease. Continue statin therapy     Essential hypertension blood pressure control important in reducing the progression of atherosclerotic disease. On appropriate oral medications.  Lymphedema The patient's lymphedema is under fair control.  I gave her a new prescription today for some lower grade compression socks to wear under the Velcro compression system.  This may cause a little less issue around her ankle.  Walk as much as possible and she is currently doing physical therapy.  Elevate her legs as tolerated.  Continue to use a lymphedema pump.  Return in 6 months.    Festus BarrenJason Quay Simkin, MD  01/22/2021 1:20 PM    This note was created with Dragon medical transcription system.  Any errors from dictation are purely  unintentional

## 2021-04-07 ENCOUNTER — Emergency Department
Admission: EM | Admit: 2021-04-07 | Discharge: 2021-04-07 | Disposition: A | Discharge status: Home / Self Care | Payer: Medicare Other | Attending: Emergency Medicine | Admitting: Emergency Medicine

## 2021-04-07 ENCOUNTER — Other Ambulatory Visit: Payer: Self-pay

## 2021-04-07 DIAGNOSIS — I110 Hypertensive heart disease with heart failure: Secondary | ICD-10-CM | POA: Insufficient documentation

## 2021-04-07 DIAGNOSIS — Z823 Family history of stroke: Secondary | ICD-10-CM | POA: Insufficient documentation

## 2021-04-07 DIAGNOSIS — Z79899 Other long term (current) drug therapy: Secondary | ICD-10-CM | POA: Insufficient documentation

## 2021-04-07 DIAGNOSIS — Z7982 Long term (current) use of aspirin: Secondary | ICD-10-CM | POA: Diagnosis not present

## 2021-04-07 DIAGNOSIS — I890 Lymphedema, not elsewhere classified: Secondary | ICD-10-CM | POA: Insufficient documentation

## 2021-04-07 DIAGNOSIS — R42 Dizziness and giddiness: Secondary | ICD-10-CM | POA: Diagnosis present

## 2021-04-07 DIAGNOSIS — R55 Syncope and collapse: Secondary | ICD-10-CM

## 2021-04-07 DIAGNOSIS — I5032 Chronic diastolic (congestive) heart failure: Secondary | ICD-10-CM | POA: Diagnosis not present

## 2021-04-07 LAB — URINALYSIS, COMPLETE (UACMP) WITH MICROSCOPIC
Bilirubin Urine: NEGATIVE
Glucose, UA: NEGATIVE mg/dL
Hgb urine dipstick: NEGATIVE
Ketones, ur: NEGATIVE mg/dL
Leukocytes,Ua: NEGATIVE
Nitrite: NEGATIVE
Protein, ur: NEGATIVE mg/dL
Specific Gravity, Urine: 1.004 — ABNORMAL LOW (ref 1.005–1.030)
pH: 7 (ref 5.0–8.0)

## 2021-04-07 LAB — CBG MONITORING, ED: Glucose-Capillary: 115 mg/dL — ABNORMAL HIGH (ref 70–99)

## 2021-04-07 LAB — BASIC METABOLIC PANEL
Anion gap: 11 (ref 5–15)
BUN: 21 mg/dL (ref 8–23)
CO2: 26 mmol/L (ref 22–32)
Calcium: 9.4 mg/dL (ref 8.9–10.3)
Chloride: 97 mmol/L — ABNORMAL LOW (ref 98–111)
Creatinine, Ser: 0.96 mg/dL (ref 0.44–1.00)
GFR, Estimated: 57 mL/min — ABNORMAL LOW (ref >60)
Glucose, Bld: 115 mg/dL — ABNORMAL HIGH (ref 70–99)
Potassium: 4.1 mmol/L (ref 3.5–5.1)
Sodium: 134 mmol/L — ABNORMAL LOW (ref 135–145)

## 2021-04-07 LAB — CBC
HCT: 36.8 % (ref 36.0–46.0)
Hemoglobin: 11.9 g/dL — ABNORMAL LOW (ref 12.0–15.0)
MCH: 26.8 pg (ref 26.0–34.0)
MCHC: 32.3 g/dL (ref 30.0–36.0)
MCV: 82.9 fL (ref 80.0–100.0)
Platelets: 218 10*3/uL (ref 150–400)
RBC: 4.44 MIL/uL (ref 3.87–5.11)
RDW: 15.5 % (ref 11.5–15.5)
WBC: 6.1 10*3/uL (ref 4.0–10.5)
nRBC: 0 % (ref 0.0–0.2)

## 2021-04-07 LAB — TROPONIN I (HIGH SENSITIVITY): Troponin I (High Sensitivity): 4 ng/L (ref <18)

## 2021-04-07 NOTE — ED Notes (Signed)
ED provider at bedside at this time 

## 2021-04-07 NOTE — Discharge Instructions (Signed)
Your blood pressure was significantly elevated today.  Please follow-up with your primary care provider for recheck and for management of your blood pressure medications.  Continue to take your blood pressure medications as prescribed.  The rest of your blood work and EKG were reassuring.  Please make sure to get up slowly to avoid feeling lightheaded.  Please return to the emergency department if you develop any chest pain worsening shortness of breath or any other symptoms that are concerning to you.

## 2021-04-07 NOTE — ED Triage Notes (Signed)
Pt comes pov with dizziness and feeling like she's going to pass out. States urinating a lot more frequently. Denies burning with urination. Denies blood sugar issues. Was given something for her bladder but isn't helping.

## 2021-04-07 NOTE — ED Provider Notes (Signed)
[facility]  ____________________________________________   Event Date/Time   First MD Initiated Contact with Patient 04/07/21 1232     (approximate)  I have reviewed the triage vital signs and the nursing notes.   HISTORY  Chief Complaint Dizziness    HPI [name] is a 87 y.o. female HTN, chronic lymphedema, thyroid disease who presents after an episode of lightheadedness.  Patient was in her normal state of health when she got up from eating and walked to the sink.  She felt like she was going to pass out.  She then sat down and felt improved.  She denies associated dyspnea, palpitations or chest pain.  She notes that over the past several days she has just felt fatigued but again denies chest pain.  She denies fevers, chills abdominal pain nausea or vomiting.  Has been eating well.  Does note that she has urinary frequency but this is been going on for a while.  She has been told she has a "weak bladder."  She has chronic dyspnea on exertion but this is unchanged.         Past Medical History:  Diagnosis Date   Hypertension    Lymphedema of both lower extremities    Thyroid disease     Patient Active Problem List   Diagnosis Date Noted   Bradycardia 01/16/2021   Chronic diastolic CHF (congestive heart failure), NYHA class 2 (HCC) 04/06/2020   Leg numbness 03/20/2020   Leg swelling 03/20/2020   Lymphedema of both lower extremities 03/20/2020   Anosmia 03/06/2020   Bilateral carotid artery stenosis 03/06/2020   Chronic cough 03/06/2020   Dyspnea on exertion 03/06/2020   GERD (gastroesophageal reflux disease) 03/06/2020   Hyperlipidemia 03/06/2020   Lymphedema 03/06/2020   Migraines 03/06/2020   Osteoarthritis of both knees 03/06/2020   Idiopathic peripheral neuropathy 12/29/2013   Localized, primary osteoarthritis 12/28/2013   Osteoarthritis of knee 12/28/2013   Essential hypertension 10/23/2010   Family history of stroke 10/22/2010     History reviewed. No pertinent surgical history.  Prior to Admission medications   Medication Sig Start Date End Date Taking? Authorizing Provider  amLODipine (NORVASC) 5 MG tablet Take 5 mg by mouth daily. Patient not taking: Reported on 01/22/2021    [provider]  aspirin 81 MG chewable tablet Chew 81 mg by mouth daily.    [provider]  atorvastatin (LIPITOR) 40 MG tablet Take 40 mg by mouth daily.    [provider]  azithromycin (ZITHROMAX Z-PAK) 250 MG tablet Take 2 tablets (500 mg) on  Day 1,  followed by 1 tablet (250 mg) once daily on Days 2 through 5. Patient not taking: Reported on 01/22/2021 12/08/20   Tommi Rumps, PA-C  benzonatate (TESSALON PERLES) 100 MG capsule Take 1 capsule (100 mg total) by mouth 3 (three) times daily as needed for cough. Patient not taking: Reported on 01/22/2021 12/08/20 12/08/21  Tommi Rumps, PA-C  carvedilol (COREG) 6.25 MG tablet Take 1 tablet (6.25 mg total) by mouth 2 (two) times daily. 09/30/20 09/30/21  Sherrie Mustache Roselyn Bering, PA-C  Cholecalciferol 50 MCG (2000 UT) CAPS Take 2,000 Units by mouth daily.    [provider]  furosemide (LASIX) 20 MG tablet Take 1 tablet (20 mg total) by mouth daily for 10 days. 03/02/20 03/12/20  Sharman Cheek, MD  hydrochlorothiazide (HYDRODIURIL) 25 MG tablet Take 25 mg by mouth daily. Patient not taking: Reported on 01/22/2021    [provider]  levothyroxine (SYNTHROID) 25 MCG tablet Take 25 mcg by mouth daily.    [provider]  losartan (COZAAR) 100 MG tablet Take 100 mg by mouth daily. Patient not taking: Reported on 01/22/2021    [provider]  metoprolol tartrate (LOPRESSOR) 25 MG tablet Take 25 mg by mouth daily. Patient not taking: Reported on 01/22/2021    [provider]  multivitamin-iron-minerals-folic acid (CENTRUM) chewable tablet Chew 1 tablet by mouth daily. Patient not taking: Reported on 01/22/2021    [provider]  omeprazole (PRILOSEC) 40 MG capsule Take 40 mg by mouth daily. Patient not taking: Reported on 01/22/2021    [provider]  potassium chloride (KLOR-CON) 10 MEQ tablet Take 1 tablet (10 mEq total) by mouth 2 (two) times daily for 10 days. 12/08/20 12/18/20  Tommi Rumps, PA-C  telmisartan (MICARDIS) 40 MG tablet Take by mouth. Patient not taking: Reported on 01/22/2021 04/06/20 04/06/21  [provider]  telmisartan-hydrochlorothiazide (MICARDIS HCT) 80-25 MG tablet Take 1 tablet by mouth daily. 10/01/20 10/01/21  [provider]    Allergies Patient has no known allergies.  History reviewed. No pertinent family history.  Social History Social History   Tobacco Use   Smoking status: Never   Smokeless tobacco: Never  Substance Use Topics   Alcohol use: Not Currently   Drug use: Not Currently    Review of Systems   Review of Systems  Constitutional:  Negative for chills and fever.  Eyes:  Negative for visual disturbance.  Respiratory:  Negative for shortness of breath.   Cardiovascular:  Positive for leg swelling. Negative for chest pain and palpitations.  Gastrointestinal:  Negative for abdominal pain.  Neurological:  Positive for light-headedness. Negative for weakness, numbness and headaches.  All other systems reviewed and are negative.  Physical Exam Updated Vital Signs BP (!) 228/76 (BP Location: Right Arm)   Pulse 70   Temp 97.6 F (36.4 C) (Oral)   Resp 18   Ht 5\' 7"  (1.702 m)   Wt 117.9 kg   SpO2 100%   BMI 40.72 kg/m   Physical Exam Vitals and nursing note reviewed.  Constitutional:      General: She is not in acute distress.    Appearance: Normal appearance.  HENT:     Head: Normocephalic and atraumatic.     Nose: Nose normal.  Eyes:     General: No scleral icterus.    Conjunctiva/sclera: Conjunctivae normal.  Cardiovascular:     Rate and Rhythm: Normal rate and regular rhythm.     Heart sounds: No murmur  heard. Pulmonary:     Effort: Pulmonary effort is normal. No respiratory distress.     Breath sounds: No stridor.  Abdominal:     General: Abdomen is flat. There is no distension.     Palpations: Abdomen is soft.     Tenderness: There is no abdominal tenderness. There is no guarding.  Musculoskeletal:        General: No deformity or signs of injury.     Cervical back: Normal range of motion.     Right lower leg: Edema present.     Left lower leg: Edema present.     Comments: Bilateral lower extremities are wrapped, edema notes  Skin:    General: Skin is dry.     Coloration: Skin is not jaundiced or pale.  Neurological:     General: No focal deficit present.     Mental Status: She is alert and  oriented to person, place, and time. Mental status is at baseline.  Psychiatric:        Mood and Affect: Mood normal.        Behavior: Behavior normal.     LABS (all labs ordered are listed, but only abnormal results are displayed)  Labs Reviewed  BASIC METABOLIC PANEL - Abnormal; Notable for the following components:      Result Value   Sodium 134 (*)    Chloride 97 (*)    Glucose, Bld 115 (*)    GFR, Estimated 57 (*)    All other components within normal limits  CBC - Abnormal; Notable for the following components:   Hemoglobin 11.9 (*)    All other components within normal limits  CBG MONITORING, ED - Abnormal; Notable for the following components:   Glucose-Capillary 115 (*)    All other components within normal limits  URINALYSIS, COMPLETE (UACMP) WITH MICROSCOPIC  TROPONIN I (HIGH SENSITIVITY)   ____________________________________________  EKG  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  ED MD interpretation:  n/a    ____________________________________________   PROCEDURES  Procedure(s) performed (including Critical Care):  Procedures   ____________________________________________   INITIAL IMPRESSION / ASSESSMENT AND PLAN / ED COURSE     Patient is an 87 year old female presenting with an episode of lightheadedness.  This occurred after standing and was not associated with any other concerning symptoms of chest pain dyspnea or palpitations.  Resolved upon sitting down.  She endorses some generalized fatigue but otherwise is asymptomatic.  Vital signs are notable for significant hypertension-she tells me that her blood pressure normally runs high.  On review of recent ED visits looks like she has been in the 190s to 200s consistently so I am not concerned about this necessarily.  Her EKG shows a bifascicular block without ischemic changes which is unchanged from prior.  Her troponin is negative and the rest of her labs are reassuring.  The episode today sounds most consistent with orthostatic hypotension.  Given she is currently asymptomatic with a reassuring work-up I think she is suitable for discharge.  I advised that she follow-up with her primary care physician to further manage her elevated blood pressure.  Clinical Course as of 04/07/21 1401  Sun Apr 07, 2021  1247 Hemoglobin(!): 11.9 baseline [KM]  1342 Troponin I (High Sensitivity): 4 [KM]  1345 Troponin I (High Sensitivity): 4 [KM]  1400 MCV: 82.9 [KM]    Clinical Course User Index [KM] Georga Hacking, MD     ____________________________________________   FINAL CLINICAL IMPRESSION(S) / ED DIAGNOSES  Final diagnoses:  Postural dizziness with presyncope     ED Discharge Orders     None        Note:  This document was prepared using Dragon voice recognition software and may include unintentional dictation errors.    Georga Hacking,  MD 04/07/21 [phone]

## 2021-04-07 NOTE — ED Provider Notes (Signed)
Emergency Medicine Provider Triage Evaluation Note  Latoya Sloan , a 87 y.o. female  was evaluated in triage.  Pt complains of dizziness, intermittent shortness of breath and urinary frequency.  She reports this started a few days ago but worsened this morning.  She denies headache, vision changes, syncope, chest pain, abdominal pain, nausea, vomiting.  History of lymphedema.  Review of Systems  Positive: Dizziness, shortness of breath and urinary frequency Negative: Headache, vision changes, syncope, chest pain, abdominal pain, nausea, vomiting  Physical Exam  BP (!) 235/67   Pulse 64   Temp 98.2 F (36.8 C) (Oral)   Resp 18   Ht 5\' 7"  (1.702 m)   Wt 117.9 kg   BMI 40.72 kg/m  Gen:   Awake, appears uncomfortable Resp:  Normal effort CV:  Lymphedema wraps to BLE.     Medical Decision Making  Medically screening exam initiated at 11:55 AM.  Appropriate orders placed.  Senaida Chilcote was informed that the remainder of the evaluation will be completed by another provider, this initial triage assessment does not replace that evaluation, and the importance of remaining in the ED until their evaluation is complete.     Dorita Sciara, NP 04/07/21 1205    06/07/21, MD 04/07/21 [phone]

## 2021-04-16 ENCOUNTER — Other Ambulatory Visit: Payer: Self-pay

## 2021-04-16 ENCOUNTER — Ambulatory Visit (INDEPENDENT_AMBULATORY_CARE_PROVIDER_SITE_OTHER): Payer: Medicare Other | Admitting: Nurse Practitioner

## 2021-04-16 VITALS — BP 161/68 | HR 59 | Ht 67.0 in | Wt 258.0 lb

## 2021-04-16 DIAGNOSIS — I890 Lymphedema, not elsewhere classified: Secondary | ICD-10-CM

## 2021-04-16 DIAGNOSIS — E785 Hyperlipidemia, unspecified: Secondary | ICD-10-CM

## 2021-04-16 DIAGNOSIS — I10 Essential (primary) hypertension: Secondary | ICD-10-CM

## 2021-04-24 ENCOUNTER — Other Ambulatory Visit: Payer: Self-pay

## 2021-04-24 ENCOUNTER — Ambulatory Visit (INDEPENDENT_AMBULATORY_CARE_PROVIDER_SITE_OTHER): Payer: Medicare Other | Admitting: Nurse Practitioner

## 2021-04-24 VITALS — BP 147/66 | HR 60 | Ht 67.0 in | Wt 263.0 lb

## 2021-04-24 DIAGNOSIS — I890 Lymphedema, not elsewhere classified: Secondary | ICD-10-CM

## 2021-04-24 NOTE — Progress Notes (Signed)
History of Present Illness  There is no documented history at this time  Assessments & Plan   There are no diagnoses linked to this encounter.    Additional instructions  Subjective:  Patient presents with venous ulcer of the Bilateral lower extremity.    Procedure:  3 layer unna wrap was placed Bilateral lower extremity.   Plan:   Follow up in one week.  

## 2021-04-25 ENCOUNTER — Encounter (INDEPENDENT_AMBULATORY_CARE_PROVIDER_SITE_OTHER): Payer: Self-pay | Admitting: Nurse Practitioner

## 2021-04-25 NOTE — Progress Notes (Signed)
Subjective:    Patient ID: Latoya Sloan, female    DOB: [birth year], 87 y.o.   MRN: [id number] Chief Complaint  Patient presents with   Follow-up    Want to talk about leg/compression    Latoya Sloan is an 87 year old female that presents today due to concerns for utilizing her compression socks.  The patient has purchased compression socks but due to her body habitus makes it difficult for them to be donned.  She notes that they are painful for her after she has gone for short amount of time.  Since she has not been able to wear them her leg swelling is worsening.  There are no wounds or ulcerations.   Review of Systems  Cardiovascular:  Positive for leg swelling.  All other systems reviewed and are negative.     Objective:   Physical Exam Vitals reviewed.  HENT:     Head: Normocephalic.  Cardiovascular:     Rate and Rhythm: Normal rate.     Pulses: Normal pulses.  Pulmonary:     Effort: Pulmonary effort is normal.  Musculoskeletal:     Right lower leg: 2+ Edema present.     Left lower leg: 2+ Edema present.  Skin:    General: Skin is warm and dry.  Neurological:     Mental Status: She is alert and oriented to person, place, and time.  Psychiatric:        Mood and Affect: Mood normal.        Behavior: Behavior normal.        Thought Content: Thought content normal.        Judgment: Judgment normal.    BP (!) 161/68   Pulse (!) 59   Ht 5\' 7"  (1.702 m)   Wt 258 lb (117 kg)   BMI 40.41 kg/m   Past Medical History:  Diagnosis Date   Hypertension    Lymphedema of both lower extremities    Thyroid disease     Social History   Socioeconomic History   Marital status: Widowed    Spouse name: Not on file   Number of children: Not on file   Years of education: Not on file   Highest education level: Not on file  Occupational History   Not on file  Tobacco Use   Smoking status: Never   Smokeless tobacco: Never  Substance and Sexual Activity   Alcohol use: Not  Currently   Drug use: Not Currently   Sexual activity: Not on file  Other Topics Concern   Not on file  Social History Narrative   Not on file   Social Determinants of Health   Financial Resource Strain: Not on file  Food Insecurity: Not on file  Transportation Needs: Not on file  Physical Activity: Not on file  Stress: Not on file  Social Connections: Not on file  Intimate Partner Violence: Not on file    History reviewed. No pertinent surgical history.  History reviewed. No pertinent family history.  No Known Allergies  CBC Latest Ref Rng & Units 04/07/2021 12/08/2020 09/30/2020  WBC 4.0 - 10.5 K/uL 6.1 9.0 6.0  Hemoglobin 12.0 - 15.0 g/dL 11.9(L) 11.1(L) 12.9  Hematocrit 36.0 - 46.0 % 36.8 33.4(L) 40.3  Platelets 150 - 400 K/uL 218 191 217      CMP     Component Value Date/Time   NA 134 (L) 04/07/2021 1153   K 4.1 04/07/2021 1153   CL 97 (L) 04/07/2021 1153  CO2 26 04/07/2021 1153   GLUCOSE 115 (H) 04/07/2021 1153   BUN 21 04/07/2021 1153   CREATININE 0.96 04/07/2021 1153   CALCIUM 9.4 04/07/2021 1153   PROT 6.6 12/08/2020 0845   ALBUMIN 3.6 12/08/2020 0845   AST 16 12/08/2020 0845   ALT 11 12/08/2020 0845   ALKPHOS 70 12/08/2020 0845   BILITOT 1.0 12/08/2020 0845   GFRNONAA 57 (L) 04/07/2021 1153   GFRAA >60 03/01/2020 1618     No results found.     Assessment & Plan:   1. Lymphedema of both lower extremities We will place the patient in wraps to see if this assists with the patient's swelling and use of compression socks.  She will present to the office on a weekly basis to have the wraps placed.  We will reevaluate her progress in 4 weeks.  2. Hyperlipidemia, unspecified hyperlipidemia type Continue statin as ordered and reviewed, no changes at this time   3. Essential hypertension Continue antihypertensive medications as already ordered, these medications have been reviewed and there are no changes at this time.    Current Outpatient  Medications on File Prior to Visit  Medication Sig Dispense Refill   amLODipine (NORVASC) 5 MG tablet Take 5 mg by mouth daily.     aspirin 81 MG chewable tablet Chew 81 mg by mouth daily.     atorvastatin (LIPITOR) 40 MG tablet Take 40 mg by mouth daily.     azithromycin (ZITHROMAX Z-PAK) 250 MG tablet Take 2 tablets (500 mg) on  Day 1,  followed by 1 tablet (250 mg) once daily on Days 2 through 5. (Patient taking differently: Take 2 tablets (500 mg) on  Day 1,  followed by 1 tablet (250 mg) once daily on Days 2 through 5.) 6 each 0   benzonatate (TESSALON PERLES) 100 MG capsule Take 1 capsule (100 mg total) by mouth 3 (three) times daily as needed for cough. 30 capsule 0   carvedilol (COREG) 6.25 MG tablet Take 1 tablet (6.25 mg total) by mouth 2 (two) times daily. 60 tablet 11   Cholecalciferol 50 MCG (2000 UT) CAPS Take 2,000 Units by mouth daily.     hydrochlorothiazide (HYDRODIURIL) 25 MG tablet Take 25 mg by mouth daily.     levothyroxine (SYNTHROID) 25 MCG tablet Take 25 mcg by mouth daily.     losartan (COZAAR) 100 MG tablet Take 100 mg by mouth daily.     metoprolol tartrate (LOPRESSOR) 25 MG tablet Take 25 mg by mouth daily.     multivitamin-iron-minerals-folic acid (CENTRUM) chewable tablet Chew 1 tablet by mouth daily.     omeprazole (PRILOSEC) 40 MG capsule Take 40 mg by mouth daily.     omeprazole (PRILOSEC) 40 MG capsule Take 1 capsule by mouth daily.     oxybutynin (DITROPAN-XL) 10 MG 24 hr tablet Take 1 tablet by mouth at bedtime.     telmisartan-hydrochlorothiazide (MICARDIS HCT) 80-25 MG tablet Take 1 tablet by mouth daily.     furosemide (LASIX) 20 MG tablet Take 1 tablet (20 mg total) by mouth daily for 10 days. 10 tablet 0   levofloxacin (LEVAQUIN) 500 MG tablet levofloxacin 500 mg tablet  TAKE 1 TABLET BY MOUTH ONCE DAILY FOR 7 DAYS     potassium chloride (KLOR-CON) 10 MEQ tablet Take 1 tablet (10 mEq total) by mouth 2 (two) times daily for 10 days. 20 tablet 0    telmisartan (MICARDIS) 40 MG tablet Take by mouth. (Patient not taking:  Reported on 01/22/2021)     No current facility-administered medications on file prior to visit.    There are no Patient Instructions on file for this visit. No follow-ups on file.   Georgiana Spinner, NP

## 2021-04-28 ENCOUNTER — Encounter (INDEPENDENT_AMBULATORY_CARE_PROVIDER_SITE_OTHER): Payer: Self-pay | Admitting: Nurse Practitioner

## 2021-04-30 ENCOUNTER — Other Ambulatory Visit: Payer: Self-pay

## 2021-04-30 ENCOUNTER — Ambulatory Visit (INDEPENDENT_AMBULATORY_CARE_PROVIDER_SITE_OTHER): Payer: Medicare Other | Admitting: Nurse Practitioner

## 2021-04-30 ENCOUNTER — Encounter (INDEPENDENT_AMBULATORY_CARE_PROVIDER_SITE_OTHER): Payer: Self-pay

## 2021-04-30 VITALS — BP 150/71 | HR 65 | Resp 16 | Wt 265.0 lb

## 2021-04-30 DIAGNOSIS — I890 Lymphedema, not elsewhere classified: Secondary | ICD-10-CM | POA: Diagnosis not present

## 2021-04-30 NOTE — Progress Notes (Signed)
History of Present Illness  There is no documented history at this time  Assessments & Plan   There are no diagnoses linked to this encounter.    Additional instructions  Subjective:  Patient presents with venous ulcer of the Bilateral lower extremity.    Procedure:  3 layer unna wrap was placed Bilateral lower extremity.   Plan:   Follow up in one week.  

## 2021-05-01 ENCOUNTER — Encounter (INDEPENDENT_AMBULATORY_CARE_PROVIDER_SITE_OTHER): Payer: Medicare Other

## 2021-05-07 ENCOUNTER — Other Ambulatory Visit: Payer: Self-pay

## 2021-05-07 ENCOUNTER — Ambulatory Visit (INDEPENDENT_AMBULATORY_CARE_PROVIDER_SITE_OTHER): Payer: Medicare Other | Admitting: Nurse Practitioner

## 2021-05-07 ENCOUNTER — Encounter (INDEPENDENT_AMBULATORY_CARE_PROVIDER_SITE_OTHER): Payer: Self-pay | Admitting: Nurse Practitioner

## 2021-05-07 VITALS — BP 158/81 | HR 74 | Resp 16

## 2021-05-07 DIAGNOSIS — I890 Lymphedema, not elsewhere classified: Secondary | ICD-10-CM | POA: Diagnosis not present

## 2021-05-07 NOTE — Progress Notes (Signed)
History of Present Illness  There is no documented history at this time  Assessments & Plan   There are no diagnoses linked to this encounter.    Additional instructions  Subjective:  Patient presents with venous ulcer of the Bilateral lower extremity.    Procedure:  3 layer unna wrap was placed Bilateral lower extremity.   Plan:   Follow up in one week.  

## 2021-05-08 ENCOUNTER — Encounter (INDEPENDENT_AMBULATORY_CARE_PROVIDER_SITE_OTHER): Payer: Medicare Other

## 2021-05-13 ENCOUNTER — Encounter (INDEPENDENT_AMBULATORY_CARE_PROVIDER_SITE_OTHER): Payer: Self-pay | Admitting: Nurse Practitioner

## 2021-05-15 ENCOUNTER — Ambulatory Visit (INDEPENDENT_AMBULATORY_CARE_PROVIDER_SITE_OTHER): Payer: Medicare Other | Admitting: Nurse Practitioner

## 2021-05-17 ENCOUNTER — Ambulatory Visit (INDEPENDENT_AMBULATORY_CARE_PROVIDER_SITE_OTHER): Payer: Medicare Other | Admitting: Nurse Practitioner

## 2021-05-20 ENCOUNTER — Encounter (INDEPENDENT_AMBULATORY_CARE_PROVIDER_SITE_OTHER): Payer: Self-pay | Admitting: Nurse Practitioner

## 2021-05-20 ENCOUNTER — Ambulatory Visit (INDEPENDENT_AMBULATORY_CARE_PROVIDER_SITE_OTHER): Payer: Medicare Other | Admitting: Nurse Practitioner

## 2021-05-20 ENCOUNTER — Other Ambulatory Visit: Payer: Self-pay

## 2021-05-20 VITALS — BP 182/84 | HR 60 | Resp 16 | Wt 259.4 lb

## 2021-05-20 DIAGNOSIS — I890 Lymphedema, not elsewhere classified: Secondary | ICD-10-CM | POA: Diagnosis not present

## 2021-05-20 DIAGNOSIS — E785 Hyperlipidemia, unspecified: Secondary | ICD-10-CM

## 2021-05-20 DIAGNOSIS — I10 Essential (primary) hypertension: Secondary | ICD-10-CM | POA: Diagnosis not present

## 2021-05-26 ENCOUNTER — Encounter (INDEPENDENT_AMBULATORY_CARE_PROVIDER_SITE_OTHER): Payer: Self-pay | Admitting: Nurse Practitioner

## 2021-05-26 NOTE — Progress Notes (Signed)
Subjective:    Patient ID: Latoya Sloan, female    DOB: [birth year], 87 y.o.   MRN: [id number] Chief Complaint  Patient presents with   Follow-up    4wk bil unna boot check    Latoya Sloan is an 87 year old female returns to the office for followup evaluation regarding leg swelling.  The swelling has persisted and the pain associated with swelling continues. There have not been any interval development of a ulcerations or wounds.  Since the previous visit the patient has been wearing graduated compression stockings and has noted little if any improvement in the lymphedema. The patient has been in [employer]  The patient also states elevation during the day and exercise is being done too.      Review of Systems  Cardiovascular:  Positive for leg swelling.  All other systems reviewed and are negative.     Objective:   Physical Exam Vitals reviewed.  HENT:     Head: Normocephalic.  Cardiovascular:     Rate and Rhythm: Normal rate.     Pulses: Normal pulses.  Pulmonary:     Effort: Pulmonary effort is normal.  Musculoskeletal:     Right lower leg: Edema present.     Left lower leg: Edema present.  Neurological:     Mental Status: She is alert and oriented to person, place, and time.  Psychiatric:        Mood and Affect: Mood normal.        Behavior: Behavior normal.        Thought Content: Thought content normal.        Judgment: Judgment normal.    BP (!) 182/84 (BP Location: Left Arm)   Pulse 60   Resp 16   Wt 259 lb 6.4 oz (117.7 kg)   BMI 40.63 kg/m   Past Medical History:  Diagnosis Date   Hypertension    Lymphedema of both lower extremities    Thyroid disease     Social History   Socioeconomic History   Marital status: Widowed    Spouse name: Not on file   Number of children: Not on file   Years of education: Not on file   Highest education level: Not on file  Occupational History   Not on file  Tobacco Use   Smoking status: Never   Smokeless  tobacco: Never  Substance and Sexual Activity   Alcohol use: Not Currently   Drug use: Not Currently   Sexual activity: Not on file  Other Topics Concern   Not on file  Social History Narrative   Not on file   Social Determinants of Health   Financial Resource Strain: Not on file  Food Insecurity: Not on file  Transportation Needs: Not on file  Physical Activity: Not on file  Stress: Not on file  Social Connections: Not on file  Intimate Partner Violence: Not on file    History reviewed. No pertinent surgical history.  History reviewed. No pertinent family history.  No Known Allergies  CBC Latest Ref Rng & Units 04/07/2021 12/08/2020 09/30/2020  WBC 4.0 - 10.5 K/uL 6.1 9.0 6.0  Hemoglobin 12.0 - 15.0 g/dL 11.9(L) 11.1(L) 12.9  Hematocrit 36.0 - 46.0 % 36.8 33.4(L) 40.3  Platelets 150 - 400 K/uL 218 191 217      CMP     Component Value Date/Time   NA 134 (L) 04/07/2021 1153   K 4.1 04/07/2021 1153   CL 97 (L) 04/07/2021 1153  CO2 26 04/07/2021 1153   GLUCOSE 115 (H) 04/07/2021 1153   BUN 21 04/07/2021 1153   CREATININE 0.96 04/07/2021 1153   CALCIUM 9.4 04/07/2021 1153   PROT 6.6 12/08/2020 0845   ALBUMIN 3.6 12/08/2020 0845   AST 16 12/08/2020 0845   ALT 11 12/08/2020 0845   ALKPHOS 70 12/08/2020 0845   BILITOT 1.0 12/08/2020 0845   GFRNONAA 57 (L) 04/07/2021 1153   GFRAA >60 03/01/2020 1618     No results found.     Assessment & Plan:   1. Lymphedema of both lower extremities I had a discussion with the patient regarding the conservative tactics for lymphedema including use of medical grade compression stockings, elevation of the lower extremities in addition to ambulation.  Patient also has a lymphedema pump which should be utilized at least daily.  The patient will implement these conservative tactics we will have her follow-up in 1 month.  2. Essential hypertension Continue antihypertensive medications as already ordered, these medications have been  reviewed and there are no changes at this time.   3. Hyperlipidemia, unspecified hyperlipidemia type Continue statin as ordered and reviewed, no changes at this time    Current Outpatient Medications on File Prior to Visit  Medication Sig Dispense Refill   amLODipine (NORVASC) 5 MG tablet Take 5 mg by mouth daily.     aspirin 81 MG chewable tablet Chew 81 mg by mouth daily.     atorvastatin (LIPITOR) 40 MG tablet Take 40 mg by mouth daily.     benzonatate (TESSALON PERLES) 100 MG capsule Take 1 capsule (100 mg total) by mouth 3 (three) times daily as needed for cough. 30 capsule 0   carvedilol (COREG) 6.25 MG tablet Take 1 tablet (6.25 mg total) by mouth 2 (two) times daily. 60 tablet 11   Cholecalciferol 50 MCG (2000 UT) CAPS Take 2,000 Units by mouth daily.     hydrochlorothiazide (HYDRODIURIL) 25 MG tablet Take 25 mg by mouth daily.     levothyroxine (SYNTHROID) 25 MCG tablet Take 25 mcg by mouth daily.     losartan (COZAAR) 100 MG tablet Take 100 mg by mouth daily.     metoprolol tartrate (LOPRESSOR) 25 MG tablet Take 25 mg by mouth daily.     multivitamin-iron-minerals-folic acid (CENTRUM) chewable tablet Chew 1 tablet by mouth daily.     omeprazole (PRILOSEC) 40 MG capsule Take 40 mg by mouth daily.     omeprazole (PRILOSEC) 40 MG capsule Take 1 capsule by mouth daily.     oxybutynin (DITROPAN-XL) 10 MG 24 hr tablet Take 1 tablet by mouth at bedtime.     telmisartan-hydrochlorothiazide (MICARDIS HCT) 80-25 MG tablet Take 1 tablet by mouth daily.     azithromycin (ZITHROMAX Z-PAK) 250 MG tablet Take 2 tablets (500 mg) on  Day 1,  followed by 1 tablet (250 mg) once daily on Days 2 through 5. (Patient not taking: Reported on 05/20/2021) 6 each 0   furosemide (LASIX) 20 MG tablet Take 1 tablet (20 mg total) by mouth daily for 10 days. 10 tablet 0   levofloxacin (LEVAQUIN) 500 MG tablet levofloxacin 500 mg tablet  TAKE 1 TABLET BY MOUTH ONCE DAILY FOR 7 DAYS (Patient not taking: Reported  on 05/20/2021)     potassium chloride (KLOR-CON) 10 MEQ tablet Take 1 tablet (10 mEq total) by mouth 2 (two) times daily for 10 days. 20 tablet 0   telmisartan (MICARDIS) 40 MG tablet Take by mouth. (Patient not taking: Reported  on 01/22/2021)     No current facility-administered medications on file prior to visit.    There are no Patient Instructions on file for this visit. No follow-ups on file.   Georgiana Spinner, NP

## 2021-06-18 ENCOUNTER — Other Ambulatory Visit: Payer: Self-pay

## 2021-06-18 ENCOUNTER — Ambulatory Visit (INDEPENDENT_AMBULATORY_CARE_PROVIDER_SITE_OTHER): Payer: Medicare Other | Admitting: Nurse Practitioner

## 2021-06-18 ENCOUNTER — Encounter (INDEPENDENT_AMBULATORY_CARE_PROVIDER_SITE_OTHER): Payer: Self-pay | Admitting: Nurse Practitioner

## 2021-06-18 VITALS — BP 181/68 | HR 60 | Resp 16 | Wt 264.4 lb

## 2021-06-18 DIAGNOSIS — I890 Lymphedema, not elsewhere classified: Secondary | ICD-10-CM | POA: Diagnosis not present

## 2021-06-18 DIAGNOSIS — E785 Hyperlipidemia, unspecified: Secondary | ICD-10-CM | POA: Diagnosis not present

## 2021-06-18 DIAGNOSIS — I10 Essential (primary) hypertension: Secondary | ICD-10-CM | POA: Diagnosis not present

## 2021-06-20 ENCOUNTER — Other Ambulatory Visit: Payer: Self-pay

## 2021-06-20 ENCOUNTER — Emergency Department
Admission: EM | Admit: 2021-06-20 | Discharge: 2021-06-20 | Disposition: A | Discharge status: Home / Self Care | Payer: Medicare Other | Attending: Emergency Medicine | Admitting: Emergency Medicine

## 2021-06-20 DIAGNOSIS — I110 Hypertensive heart disease with heart failure: Secondary | ICD-10-CM | POA: Diagnosis not present

## 2021-06-20 DIAGNOSIS — Z79899 Other long term (current) drug therapy: Secondary | ICD-10-CM | POA: Diagnosis not present

## 2021-06-20 DIAGNOSIS — Z7982 Long term (current) use of aspirin: Secondary | ICD-10-CM | POA: Diagnosis not present

## 2021-06-20 DIAGNOSIS — I10 Essential (primary) hypertension: Secondary | ICD-10-CM

## 2021-06-20 DIAGNOSIS — I5032 Chronic diastolic (congestive) heart failure: Secondary | ICD-10-CM | POA: Insufficient documentation

## 2021-06-20 LAB — BASIC METABOLIC PANEL
Anion gap: 5 (ref 5–15)
BUN: 33 mg/dL — ABNORMAL HIGH (ref 8–23)
CO2: 28 mmol/L (ref 22–32)
Calcium: 8.8 mg/dL — ABNORMAL LOW (ref 8.9–10.3)
Chloride: 108 mmol/L (ref 98–111)
Creatinine, Ser: 1.17 mg/dL — ABNORMAL HIGH (ref 0.44–1.00)
GFR, Estimated: 45 mL/min — ABNORMAL LOW (ref >60)
Glucose, Bld: 114 mg/dL — ABNORMAL HIGH (ref 70–99)
Potassium: 4.4 mmol/L (ref 3.5–5.1)
Sodium: 141 mmol/L (ref 135–145)

## 2021-06-20 MED ORDER — CARVEDILOL 6.25 MG PO TABS
6.2500 mg | ORAL_TABLET | Freq: Two times a day (BID) | ORAL | Status: DC
  Administered 2021-06-20: 6.25 mg via ORAL
  Filled 2021-06-20: qty 1

## 2021-06-20 NOTE — ED Triage Notes (Signed)
"  Was getting therapy for lymphedema in legs and my blood pressure was high, history of same, has been taking medications as prescribed. Asymptomatic for hypertension. She called her PCP and he told her to go to ED. Was 199/71 enroute" per EMS

## 2021-06-20 NOTE — ED Triage Notes (Signed)
"  I was due to take my labetalol at dinner time, but have not taken it yet so it is due" per pt

## 2021-06-20 NOTE — ED Notes (Signed)
[facility] contacted [phone]; no answer

## 2021-06-20 NOTE — ED Provider Notes (Signed)
[facility] Emergency Department Provider Note  ____________________________________________   Event Date/Time   First MD Initiated Contact with Patient 06/20/21 1643     (approximate)  I have reviewed the triage vital signs and the nursing notes.   HISTORY  Chief Complaint Hypertension   HPI Latoya Sloan is a 87 y.o. female with a past medical history of HTN, CHF, coronary disease, GERD, migraine headaches, osteoarthritis, and chronic lymphedema who presents for assessment of some elevated blood pressure.  Seems she was getting treatment for lymphedema in her legs, her blood pressure was noted to be elevated.  She states she has been taking her blood pressure medicines as directed.  She called her PCP who told her to come to the ED for evaluation.  Per EMS her BP was 199/71.  On arrival to the emergency room initial BP is 190/85 and on recheck it is 180/81.   Patient is denying any headache, vision changes, vertigo, chest pain, cough, shortness of breath, abdominal pain, back pain, headache, earache , back pain, focal extremity weakness numbness or tingling or any other acute complaints today.  She notes she has some chronic soreness and weakness in the right knee from arthritis that has been an issue going back several years is not any different today.  States she been feeling a little fatigued but denies any other sick symptoms and states she is compliant with all her medications.  She states she is due for her dose of her evening Coreg.           Past Medical History:  Diagnosis Date   Hypertension    Lymphedema of both lower extremities    Thyroid disease     Patient Active Problem List   Diagnosis Date Noted   Bradycardia 01/16/2021   Chronic diastolic CHF (congestive heart failure), NYHA class 2 (HCC) 04/06/2020   Leg numbness 03/20/2020   Leg swelling 03/20/2020   Lymphedema of both lower extremities 03/20/2020   Anosmia 03/06/2020    Bilateral carotid artery stenosis 03/06/2020   Chronic cough 03/06/2020   Dyspnea on exertion 03/06/2020   GERD (gastroesophageal reflux disease) 03/06/2020   Hyperlipidemia 03/06/2020   Lymphedema 03/06/2020   Migraines 03/06/2020   Osteoarthritis of both knees 03/06/2020   Idiopathic peripheral neuropathy 12/29/2013   Localized, primary osteoarthritis 12/28/2013   Osteoarthritis of knee 12/28/2013   Essential hypertension 10/23/2010   Family history of stroke 10/22/2010    History reviewed. No pertinent surgical history.  Prior to Admission medications   Medication Sig Start Date End Date Taking? Authorizing Provider  aspirin 81 MG chewable tablet Chew 81 mg by mouth daily.    [provider]  atorvastatin (LIPITOR) 40 MG tablet Take 40 mg by mouth daily.    [provider]  benzonatate (TESSALON PERLES) 100 MG capsule Take 1 capsule (100 mg total) by mouth 3 (three) times daily as needed for cough. 12/08/20 12/08/21  Tommi Rumps, PA-C  carvedilol (COREG) 6.25 MG tablet Take 1 tablet (6.25 mg total) by mouth 2 (two) times daily. 09/30/20 09/30/21  Sherrie Mustache Roselyn Bering, PA-C  Cholecalciferol 50 MCG (2000 UT) CAPS Take 2,000 Units by mouth daily.    [provider]  furosemide (LASIX) 20 MG tablet Take 1 tablet (20 mg total) by mouth daily for 10 days. 03/02/20 03/12/20  Sharman Cheek, MD  hydrochlorothiazide (HYDRODIURIL) 25 MG tablet Take 25 mg by mouth daily.    [provider]  levothyroxine (SYNTHROID) 25 MCG  tablet Take 25 mcg by mouth daily.    [provider]  losartan (COZAAR) 100 MG tablet Take 100 mg by mouth daily.    [provider]  metoprolol tartrate (LOPRESSOR) 25 MG tablet Take 25 mg by mouth daily.    [provider]  multivitamin-iron-minerals-folic acid (CENTRUM) chewable tablet Chew 1 tablet by mouth daily.    [provider]  omeprazole (PRILOSEC) 40 MG capsule Take 40 mg by mouth daily.     [provider]  omeprazole (PRILOSEC) 40 MG capsule Take 1 capsule by mouth daily. 03/28/21   [provider]  oxybutynin (DITROPAN-XL) 10 MG 24 hr tablet Take 1 tablet by mouth at bedtime. 03/20/21 03/20/22  [provider]  potassium chloride (KLOR-CON) 10 MEQ tablet Take 1 tablet (10 mEq total) by mouth 2 (two) times daily for 10 days. 12/08/20 12/18/20  Tommi Rumps, PA-C  telmisartan (MICARDIS) 40 MG tablet Take by mouth. Patient not taking: Reported on 01/22/2021 04/06/20 04/06/21  [provider]  telmisartan-hydrochlorothiazide (MICARDIS HCT) 80-25 MG tablet Take 1 tablet by mouth daily. 10/01/20 10/01/21  [provider]    Allergies Patient has no known allergies.  History reviewed. No pertinent family history.  Social History Social History   Tobacco Use   Smoking status: Never   Smokeless tobacco: Never  Substance Use Topics   Alcohol use: Not Currently   Drug use: Not Currently    Review of Systems  Review of Systems  Constitutional:  Positive for malaise/fatigue. Negative for chills and fever.  HENT:  Negative for sore throat.   Eyes:  Negative for pain.  Respiratory:  Negative for cough and stridor.   Cardiovascular:  Negative for chest pain.  Gastrointestinal:  Negative for vomiting.  Genitourinary:  Negative for dysuria.  Skin:  Negative for rash.  Neurological:  Negative for seizures, loss of consciousness and headaches.  Psychiatric/Behavioral:  Negative for suicidal ideas.   All other systems reviewed and are negative.    ____________________________________________   PHYSICAL EXAM:  VITAL SIGNS: ED Triage Vitals  Enc Vitals Group     BP      Pulse      Resp      Temp      Temp src      SpO2      Weight      Height      Head Circumference      Peak Flow      Pain Score      Pain Loc      Pain Edu?      Excl. in GC?    Vitals:   06/20/21 1658 06/20/21 1841  BP: (!) 190/85 (!) 181/87  Pulse:  68 70  Resp: 18 20  Temp: 98.2 F (36.8 C) 98 F (36.7 C)  SpO2: 100% 100%   Physical Exam Vitals and nursing note reviewed.  Constitutional:      General: She is not in acute distress.    Appearance: She is well-developed. She is obese.  HENT:     Head: Normocephalic and atraumatic.     Right Ear: External ear normal.     Left Ear: External ear normal.     Nose: Nose normal.  Eyes:     Conjunctiva/sclera: Conjunctivae normal.  Cardiovascular:     Rate and Rhythm: Normal rate and regular rhythm.     Heart sounds: No murmur heard. Pulmonary:     Effort: Pulmonary effort is normal.  No respiratory distress.     Breath sounds: Normal breath sounds.  Abdominal:     Palpations: Abdomen is soft.     Tenderness: There is no abdominal tenderness.  Musculoskeletal:        General: No swelling.     Cervical back: Neck supple.     Right lower leg: Edema present.     Left lower leg: Edema present.  Skin:    General: Skin is warm and dry.     Capillary Refill: Capillary refill takes less than 2 seconds.  Neurological:     Mental Status: She is alert and oriented to person, place, and time.  Psychiatric:        Mood and Affect: Mood normal.     ____________________________________________   LABS (all labs ordered are listed, but only abnormal results are displayed)  Labs Reviewed  BASIC METABOLIC PANEL - Abnormal; Notable for the following components:      Result Value   Glucose, Bld 114 (*)    BUN 33 (*)    Creatinine, Ser 1.17 (*)    Calcium 8.8 (*)    GFR, Estimated 45 (*)    All other components within normal limits   ____________________________________________  EKG  Sinus rhythm with a ventricular rate of 67, right bundle branch block with otherwise unremarkable intervals and nonspecific changes in inferior anterior lateral leads a largely similar to prior EKG on 10/3 without significant new changes or other evidence of acute  ischemia. ____________________________________________  RADIOLOGY  ED MD interpretation:    Official radiology report(s): No results found.  ____________________________________________   PROCEDURES  Procedure(s) performed (including Critical Care):  Procedures   ____________________________________________   INITIAL IMPRESSION / ASSESSMENT AND PLAN / ED COURSE      Patient presents with above-stated history exam for assessment of elevated blood pressures noted earlier today patient was getting some treatments for lymphedema.  On arrival patient's BP is 190/5 and on recheck it is 181/87.  Patient is denying any symptoms at this time and has no evidence of focal neurological deficits to suggest a CVA overall I have very low suspicion for [facility], ACS, dissection or other immediate lecture and process for patient's elevated noted today.  Reviewing prior records it seems patient has chronically elevated blood pressures.   On 11/14 her BP was 182/84. On 11/1 it was 158/81. On 10/25 it was 150/71. On on 10/19 it was 147/66. On 10/11 it was 161/68. On 10/2 in the ED it was 220/76 at that time patient had some lightheadedness.  At this time seems to be relatively asymptomatic and complete further diagnostic studies or emergent IV antihypertensives are indicated.  Advised close outpatient PCP follow-up.  Discharged in stable condition.      ____________________________________________   FINAL CLINICAL IMPRESSION(S) / ED DIAGNOSES  Final diagnoses:  Hypertension, unspecified type    Medications  carvedilol (COREG) tablet 6.25 mg (6.25 mg Oral Given 06/20/21 1745)     ED Discharge Orders     None        Note:  This document was prepared using Dragon voice recognition software and may include unintentional dictation errors.    Gilles Chiquito, MD 06/20/21 [phone]

## 2021-06-23 ENCOUNTER — Encounter (INDEPENDENT_AMBULATORY_CARE_PROVIDER_SITE_OTHER): Payer: Self-pay | Admitting: Nurse Practitioner

## 2021-06-23 NOTE — Progress Notes (Signed)
Subjective:    Patient ID: Latoya Sloan, female    DOB: [birth year], 87 y.o.   MRN: [id number] Chief Complaint  Patient presents with   Follow-up    1 month follow up    The patient returns to the office for followup evaluation regarding leg swelling.  The swelling has persisted but with the lymph pump is much, much better controlled. The pain associated with swelling is essentially eliminated. There have not been any interval development of a ulcerations or wounds.  The patient denies problems with the pump, noting it is working well and the leggings are in good condition.  Since the previous visit the patient has been wearing graduated compression stockings and using the lymph pump on a routine basis and  has noted significant improvement in the lymphedema.   Patient stated the lymph pump has been a very positive factor in her care.      Review of Systems  Cardiovascular:  Positive for leg swelling.  All other systems reviewed and are negative.     Objective:   Physical Exam Vitals reviewed.  HENT:     Head: Normocephalic.  Cardiovascular:     Rate and Rhythm: Normal rate.     Pulses: Normal pulses.  Pulmonary:     Effort: Pulmonary effort is normal.  Musculoskeletal:     Right lower leg: 1+ Edema present.     Left lower leg: 1+ Edema present.  Skin:    General: Skin is warm and dry.  Neurological:     Mental Status: She is alert and oriented to person, place, and time.  Psychiatric:        Mood and Affect: Mood normal.        Behavior: Behavior normal.        Thought Content: Thought content normal.        Judgment: Judgment normal.    BP (!) 181/68 (BP Location: Right Arm)    Pulse 60    Resp 16    Wt 264 lb 6.4 oz (119.9 kg)    BMI 41.41 kg/m   Past Medical History:  Diagnosis Date   Hypertension    Lymphedema of both lower extremities    Thyroid disease     Social History   Socioeconomic History   Marital status: Widowed    Spouse name: Not on file    Number of children: Not on file   Years of education: Not on file   Highest education level: Not on file  Occupational History   Not on file  Tobacco Use   Smoking status: Never   Smokeless tobacco: Never  Substance and Sexual Activity   Alcohol use: Not Currently   Drug use: Not Currently   Sexual activity: Not on file  Other Topics Concern   Not on file  Social History Narrative   Not on file   Social Determinants of Health   Financial Resource Strain: Not on file  Food Insecurity: Not on file  Transportation Needs: Not on file  Physical Activity: Not on file  Stress: Not on file  Social Connections: Not on file  Intimate Partner Violence: Not on file    History reviewed. No pertinent surgical history.  History reviewed. No pertinent family history.  No Known Allergies  CBC Latest Ref Rng & Units 04/07/2021 12/08/2020 09/30/2020  WBC 4.0 - 10.5 K/uL 6.1 9.0 6.0  Hemoglobin 12.0 - 15.0 g/dL 11.9(L) 11.1(L) 12.9  Hematocrit 36.0 - 46.0 % 36.8 33.4(L) 40.3  Platelets 150 - 400 K/uL 218 191 217      CMP     Component Value Date/Time   NA 141 06/20/2021 1836   K 4.4 06/20/2021 1836   CL 108 06/20/2021 1836   CO2 28 06/20/2021 1836   GLUCOSE 114 (H) 06/20/2021 1836   BUN 33 (H) 06/20/2021 1836   CREATININE 1.17 (H) 06/20/2021 1836   CALCIUM 8.8 (L) 06/20/2021 1836   PROT 6.6 12/08/2020 0845   ALBUMIN 3.6 12/08/2020 0845   AST 16 12/08/2020 0845   ALT 11 12/08/2020 0845   ALKPHOS 70 12/08/2020 0845   BILITOT 1.0 12/08/2020 0845   GFRNONAA 45 (L) 06/20/2021 1836   GFRAA >60 03/01/2020 1618     No results found.     Assessment & Plan:   1. Lymphedema of both lower extremities  No surgery or intervention at this point in time.    I have reviewed my discussion with the patient regarding lymphedema and why it  causes symptoms.  Patient will continue wearing graduated compression stockings class 1 (20-30 mmHg) on a daily basis a prescription was given. The  patient is reminded to put the stockings on first thing in the morning and removing them in the evening. The patient is instructed specifically not to sleep in the stockings.   In addition, behavioral modification throughout the day will be continued.  This will include frequent elevation (such as in a recliner), use of over the counter pain medications as needed and exercise such as walking.  I have reviewed systemic causes for chronic edema such as liver, kidney and cardiac etiologies and there does not appear to be any significant changes in these organ systems over the past year.  The patient is under the impression that these organ systems are all stable and unchanged.    The patient will continue aggressive use of the  lymph pump.  This will continue to improve the edema control and prevent sequela such as ulcers and infections.   The patient will follow-up with me in 6 months   2. Essential hypertension Continue antihypertensive medications as already ordered, these medications have been reviewed and there are no changes at this time.   3. Hyperlipidemia, unspecified hyperlipidemia type Continue statin as ordered and reviewed, no changes at this time    Current Outpatient Medications on File Prior to Visit  Medication Sig Dispense Refill   aspirin 81 MG chewable tablet Chew 81 mg by mouth daily.     atorvastatin (LIPITOR) 40 MG tablet Take 40 mg by mouth daily.     benzonatate (TESSALON PERLES) 100 MG capsule Take 1 capsule (100 mg total) by mouth 3 (three) times daily as needed for cough. 30 capsule 0   carvedilol (COREG) 6.25 MG tablet Take 1 tablet (6.25 mg total) by mouth 2 (two) times daily. 60 tablet 11   Cholecalciferol 50 MCG (2000 UT) CAPS Take 2,000 Units by mouth daily.     hydrochlorothiazide (HYDRODIURIL) 25 MG tablet Take 25 mg by mouth daily.     levothyroxine (SYNTHROID) 25 MCG tablet Take 25 mcg by mouth daily.     losartan (COZAAR) 100 MG tablet Take 100 mg by mouth  daily.     metoprolol tartrate (LOPRESSOR) 25 MG tablet Take 25 mg by mouth daily.     multivitamin-iron-minerals-folic acid (CENTRUM) chewable tablet Chew 1 tablet by mouth daily.     omeprazole (PRILOSEC) 40 MG capsule Take 40 mg by mouth daily.  omeprazole (PRILOSEC) 40 MG capsule Take 1 capsule by mouth daily.     oxybutynin (DITROPAN-XL) 10 MG 24 hr tablet Take 1 tablet by mouth at bedtime.     telmisartan-hydrochlorothiazide (MICARDIS HCT) 80-25 MG tablet Take 1 tablet by mouth daily.     furosemide (LASIX) 20 MG tablet Take 1 tablet (20 mg total) by mouth daily for 10 days. 10 tablet 0   potassium chloride (KLOR-CON) 10 MEQ tablet Take 1 tablet (10 mEq total) by mouth 2 (two) times daily for 10 days. 20 tablet 0   telmisartan (MICARDIS) 40 MG tablet Take by mouth. (Patient not taking: Reported on 01/22/2021)     No current facility-administered medications on file prior to visit.    There are no Patient Instructions on file for this visit. No follow-ups on file.   Kris Hartmann, NP

## 2021-07-30 ENCOUNTER — Ambulatory Visit (INDEPENDENT_AMBULATORY_CARE_PROVIDER_SITE_OTHER): Payer: Medicare Other | Admitting: Nurse Practitioner

## 2021-07-30 ENCOUNTER — Other Ambulatory Visit: Payer: Self-pay

## 2021-07-30 ENCOUNTER — Encounter (INDEPENDENT_AMBULATORY_CARE_PROVIDER_SITE_OTHER): Payer: Self-pay | Admitting: Nurse Practitioner

## 2021-07-30 VITALS — BP 180/64 | HR 75 | Resp 16

## 2021-07-30 DIAGNOSIS — I890 Lymphedema, not elsewhere classified: Secondary | ICD-10-CM | POA: Diagnosis not present

## 2021-07-30 NOTE — Progress Notes (Signed)
History of Present Illness  There is no documented history at this time  Assessments & Plan   There are no diagnoses linked to this encounter.    Additional instructions  Subjective:  Patient presents with venous ulcer of the Bilateral lower extremity.    Procedure:  3 layer unna wrap was placed Bilateral lower extremity.   Plan:   Follow up in one week.  

## 2021-08-06 ENCOUNTER — Other Ambulatory Visit: Payer: Self-pay

## 2021-08-06 ENCOUNTER — Ambulatory Visit (INDEPENDENT_AMBULATORY_CARE_PROVIDER_SITE_OTHER): Payer: Medicare Other | Admitting: Nurse Practitioner

## 2021-08-06 ENCOUNTER — Encounter (INDEPENDENT_AMBULATORY_CARE_PROVIDER_SITE_OTHER): Payer: Self-pay

## 2021-08-06 VITALS — BP 183/76 | HR 60 | Resp 16 | Wt 259.0 lb

## 2021-08-06 DIAGNOSIS — I890 Lymphedema, not elsewhere classified: Secondary | ICD-10-CM

## 2021-08-06 NOTE — Progress Notes (Signed)
History of Present Illness  There is no documented history at this time  Assessments & Plan   There are no diagnoses linked to this encounter.    Additional instructions  Subjective:  Patient presents with venous ulcer of the Bilateral lower extremity.    Procedure:  3 layer unna wrap was placed Bilateral lower extremity.   Plan:   Follow up in one week.  

## 2021-08-11 ENCOUNTER — Encounter (INDEPENDENT_AMBULATORY_CARE_PROVIDER_SITE_OTHER): Payer: Self-pay | Admitting: Nurse Practitioner

## 2021-08-13 ENCOUNTER — Encounter (INDEPENDENT_AMBULATORY_CARE_PROVIDER_SITE_OTHER): Payer: Self-pay

## 2021-08-13 ENCOUNTER — Ambulatory Visit (INDEPENDENT_AMBULATORY_CARE_PROVIDER_SITE_OTHER): Payer: Medicare Other | Admitting: Nurse Practitioner

## 2021-08-13 ENCOUNTER — Other Ambulatory Visit: Payer: Self-pay

## 2021-08-13 VITALS — BP 182/74 | HR 67 | Resp 16 | Wt 258.6 lb

## 2021-08-13 DIAGNOSIS — I890 Lymphedema, not elsewhere classified: Secondary | ICD-10-CM

## 2021-08-13 NOTE — Progress Notes (Signed)
History of Present Illness  There is no documented history at this time  Assessments & Plan   There are no diagnoses linked to this encounter.    Additional instructions  Subjective:  Patient presents with venous ulcer of the Bilateral lower extremity.    Procedure:  3 layer unna wrap was placed Bilateral lower extremity.   Plan:   Follow up in one week.  

## 2021-08-17 ENCOUNTER — Encounter (INDEPENDENT_AMBULATORY_CARE_PROVIDER_SITE_OTHER): Payer: Self-pay | Admitting: Nurse Practitioner

## 2021-08-20 ENCOUNTER — Other Ambulatory Visit: Payer: Self-pay

## 2021-08-20 ENCOUNTER — Encounter (INDEPENDENT_AMBULATORY_CARE_PROVIDER_SITE_OTHER): Payer: Self-pay | Admitting: Nurse Practitioner

## 2021-08-20 ENCOUNTER — Ambulatory Visit (INDEPENDENT_AMBULATORY_CARE_PROVIDER_SITE_OTHER): Payer: Medicare Other | Admitting: Nurse Practitioner

## 2021-08-20 VITALS — BP 163/77 | HR 60 | Resp 16 | Wt 261.4 lb

## 2021-08-20 DIAGNOSIS — I890 Lymphedema, not elsewhere classified: Secondary | ICD-10-CM

## 2021-08-20 NOTE — Progress Notes (Signed)
History of Present Illness  There is no documented history at this time  Assessments & Plan   There are no diagnoses linked to this encounter.    Additional instructions  Subjective:  Patient presents with venous ulcer of the Bilateral lower extremity.    Procedure:  3 layer unna wrap was placed Bilateral lower extremity.   Plan:   Follow up in one week.  

## 2021-08-25 ENCOUNTER — Encounter (INDEPENDENT_AMBULATORY_CARE_PROVIDER_SITE_OTHER): Payer: Self-pay | Admitting: Nurse Practitioner

## 2021-08-27 ENCOUNTER — Ambulatory Visit (INDEPENDENT_AMBULATORY_CARE_PROVIDER_SITE_OTHER): Payer: Medicare Other | Admitting: Nurse Practitioner

## 2021-08-27 ENCOUNTER — Other Ambulatory Visit: Payer: Self-pay

## 2021-08-27 VITALS — BP 133/68 | HR 65 | Ht 67.0 in | Wt 262.0 lb

## 2021-08-27 DIAGNOSIS — I10 Essential (primary) hypertension: Secondary | ICD-10-CM | POA: Diagnosis not present

## 2021-08-27 DIAGNOSIS — I890 Lymphedema, not elsewhere classified: Secondary | ICD-10-CM | POA: Diagnosis not present

## 2021-08-27 DIAGNOSIS — E785 Hyperlipidemia, unspecified: Secondary | ICD-10-CM

## 2021-08-31 ENCOUNTER — Encounter (INDEPENDENT_AMBULATORY_CARE_PROVIDER_SITE_OTHER): Payer: Self-pay | Admitting: Nurse Practitioner

## 2021-08-31 NOTE — Progress Notes (Signed)
Subjective:    Patient ID: Latoya Sloan, female    DOB: 05-13-1932, 88 y.o.   MRN: [id number] Chief Complaint  Patient presents with   Follow-up    4 wk unna boot check    Latoya Sloan is an 88 year old female that presents today for follow-up evaluation of lower extremity edema the patient has been in [place] wraps for several weeks and she notes that her legs feel much better after being in these Unna wraps.  She notes that the swelling is down.  The patient also notes that she has not been using her lymphedema pump while in her unna wraps   Review of Systems  Cardiovascular:  Positive for leg swelling.  All other systems reviewed and are negative.     Objective:   Physical Exam Vitals reviewed.  HENT:     Head: Normocephalic.  Cardiovascular:     Rate and Rhythm: Normal rate.  Pulmonary:     Effort: Pulmonary effort is normal.  Musculoskeletal:     Right lower leg: 2+ Edema present.     Left lower leg: 2+ Edema present.  Skin:    General: Skin is warm and dry.  Neurological:     Mental Status: She is alert and oriented to person, place, and time.  Psychiatric:        Mood and Affect: Mood normal.        Behavior: Behavior normal.        Thought Content: Thought content normal.        Judgment: Judgment normal.    BP 133/68    Pulse 65    Ht 5\' 7"  (1.702 m)    Wt 262 lb (118.8 kg)    BMI 41.04 kg/m   Past Medical History:  Diagnosis Date   Hypertension    Lymphedema of both lower extremities    Thyroid disease     Social History   Socioeconomic History   Marital status: Widowed    Spouse name: Not on file   Number of children: Not on file   Years of education: Not on file   Highest education level: Not on file  Occupational History   Not on file  Tobacco Use   Smoking status: Never   Smokeless tobacco: Never  Substance and Sexual Activity   Alcohol use: Not Currently   Drug use: Not Currently   Sexual activity: Not on file  Other Topics Concern    Not on file  Social History Narrative   Not on file   Social Determinants of Health   Financial Resource Strain: Not on file  Food Insecurity: Not on file  Transportation Needs: Not on file  Physical Activity: Not on file  Stress: Not on file  Social Connections: Not on file  Intimate Partner Violence: Not on file    History reviewed. No pertinent surgical history.  History reviewed. No pertinent family history.  No Known Allergies  CBC Latest Ref Rng & Units 04/07/2021 12/08/2020 09/30/2020  WBC 4.0 - 10.5 K/uL 6.1 9.0 6.0  Hemoglobin 12.0 - 15.0 g/dL 11.9(L) 11.1(L) 12.9  Hematocrit 36.0 - 46.0 % 36.8 33.4(L) 40.3  Platelets 150 - 400 K/uL 218 191 217      CMP     Component Value Date/Time   NA 141 06/20/2021 1836   K 4.4 06/20/2021 1836   CL 108 06/20/2021 1836   CO2 28 06/20/2021 1836   GLUCOSE 114 (H) 06/20/2021 1836   BUN 33 (H) 06/20/2021  1836   CREATININE 1.17 (H) 06/20/2021 1836   CALCIUM 8.8 (L) 06/20/2021 1836   PROT 6.6 12/08/2020 0845   ALBUMIN 3.6 12/08/2020 0845   AST 16 12/08/2020 0845   ALT 11 12/08/2020 0845   ALKPHOS 70 12/08/2020 0845   BILITOT 1.0 12/08/2020 0845   GFRNONAA 45 (L) 06/20/2021 1836   GFRAA >60 03/01/2020 1618     No results found.     Assessment & Plan:   1. Lymphedema of both lower extremities Conservative therapy was discussed in detail with the patient once again.  That includes use of either compression socks or compression wraps to be utilized daily.  She should also be elevating her lower extremities whenever she is not active in addition to utilizing her lymphedema pump twice daily.  We will have the patient return in 3 months to evaluate lower extremity edema.  2. Essential hypertension Continue antihypertensive medications as already ordered, these medications have been reviewed and there are no changes at this time.   3. Hyperlipidemia, unspecified hyperlipidemia type Continue statin as ordered and reviewed, no  changes at this time    Current Outpatient Medications on File Prior to Visit  Medication Sig Dispense Refill   aspirin 81 MG chewable tablet Chew 81 mg by mouth daily.     atorvastatin (LIPITOR) 40 MG tablet Take 40 mg by mouth daily.     benzonatate (TESSALON PERLES) 100 MG capsule Take 1 capsule (100 mg total) by mouth 3 (three) times daily as needed for cough. 30 capsule 0   carvedilol (COREG) 6.25 MG tablet Take 1 tablet (6.25 mg total) by mouth 2 (two) times daily. 60 tablet 11   Cholecalciferol 50 MCG (2000 UT) CAPS Take 2,000 Units by mouth daily.     hydrochlorothiazide (HYDRODIURIL) 25 MG tablet Take 25 mg by mouth daily.     levothyroxine (SYNTHROID) 25 MCG tablet Take 25 mcg by mouth daily.     losartan (COZAAR) 100 MG tablet Take 100 mg by mouth daily.     metoprolol tartrate (LOPRESSOR) 25 MG tablet Take 25 mg by mouth daily.     multivitamin-iron-minerals-folic acid (CENTRUM) chewable tablet Chew 1 tablet by mouth daily.     omeprazole (PRILOSEC) 40 MG capsule Take 40 mg by mouth daily.     omeprazole (PRILOSEC) 40 MG capsule Take 1 capsule by mouth daily.     oxybutynin (DITROPAN-XL) 10 MG 24 hr tablet Take 1 tablet by mouth at bedtime.     telmisartan-hydrochlorothiazide (MICARDIS HCT) 80-25 MG tablet Take 1 tablet by mouth daily.     furosemide (LASIX) 20 MG tablet Take 1 tablet (20 mg total) by mouth daily for 10 days. 10 tablet 0   potassium chloride (KLOR-CON) 10 MEQ tablet Take 1 tablet (10 mEq total) by mouth 2 (two) times daily for 10 days. 20 tablet 0   telmisartan (MICARDIS) 40 MG tablet Take by mouth. (Patient not taking: Reported on 01/22/2021)     No current facility-administered medications on file prior to visit.    There are no Patient Instructions on file for this visit. No follow-ups on file.   Kris Hartmann, NP

## 2021-09-12 ENCOUNTER — Encounter: Payer: Self-pay | Admitting: Emergency Medicine

## 2021-09-12 ENCOUNTER — Other Ambulatory Visit: Payer: Self-pay

## 2021-09-12 ENCOUNTER — Emergency Department
Admission: EM | Admit: 2021-09-12 | Discharge: 2021-09-12 | Disposition: A | Discharge status: Home / Self Care | Payer: Medicare Other | Attending: Emergency Medicine | Admitting: Emergency Medicine

## 2021-09-12 DIAGNOSIS — I10 Essential (primary) hypertension: Secondary | ICD-10-CM | POA: Insufficient documentation

## 2021-09-12 DIAGNOSIS — R112 Nausea with vomiting, unspecified: Secondary | ICD-10-CM | POA: Diagnosis not present

## 2021-09-12 DIAGNOSIS — Z20822 Contact with and (suspected) exposure to covid-19: Secondary | ICD-10-CM | POA: Insufficient documentation

## 2021-09-12 LAB — URINALYSIS, ROUTINE W REFLEX MICROSCOPIC
Bilirubin Urine: NEGATIVE
Glucose, UA: NEGATIVE mg/dL
Ketones, ur: NEGATIVE mg/dL
Leukocytes,Ua: NEGATIVE
Nitrite: NEGATIVE
Protein, ur: NEGATIVE mg/dL
Specific Gravity, Urine: 1.006 (ref 1.005–1.030)
WBC, UA: NONE SEEN WBC/hpf (ref 0–5)
pH: 7 (ref 5.0–8.0)

## 2021-09-12 LAB — LIPASE, BLOOD: Lipase: 43 U/L (ref 11–51)

## 2021-09-12 LAB — COMPREHENSIVE METABOLIC PANEL
ALT: 16 U/L (ref 0–44)
AST: 26 U/L (ref 15–41)
Albumin: 3.8 g/dL (ref 3.5–5.0)
Alkaline Phosphatase: 75 U/L (ref 38–126)
Anion gap: 9 (ref 5–15)
BUN: 28 mg/dL — ABNORMAL HIGH (ref 8–23)
CO2: 28 mmol/L (ref 22–32)
Calcium: 9.3 mg/dL (ref 8.9–10.3)
Chloride: 105 mmol/L (ref 98–111)
Creatinine, Ser: 1.03 mg/dL — ABNORMAL HIGH (ref 0.44–1.00)
GFR, Estimated: 53 mL/min — ABNORMAL LOW (ref >60)
Glucose, Bld: 112 mg/dL — ABNORMAL HIGH (ref 70–99)
Potassium: 4.6 mmol/L (ref 3.5–5.1)
Sodium: 142 mmol/L (ref 135–145)
Total Bilirubin: 1.3 mg/dL — ABNORMAL HIGH (ref 0.3–1.2)
Total Protein: 6.8 g/dL (ref 6.5–8.1)

## 2021-09-12 LAB — CBC
HCT: 36.7 % (ref 36.0–46.0)
Hemoglobin: 11.4 g/dL — ABNORMAL LOW (ref 12.0–15.0)
MCH: 26.8 pg (ref 26.0–34.0)
MCHC: 31.1 g/dL (ref 30.0–36.0)
MCV: 86.4 fL (ref 80.0–100.0)
Platelets: 190 10*3/uL (ref 150–400)
RBC: 4.25 MIL/uL (ref 3.87–5.11)
RDW: 17.2 % — ABNORMAL HIGH (ref 11.5–15.5)
WBC: 8.2 10*3/uL (ref 4.0–10.5)
nRBC: 0 % (ref 0.0–0.2)

## 2021-09-12 LAB — RESP PANEL BY RT-PCR (FLU A&B, COVID) ARPGX2
Influenza A by PCR: NEGATIVE
Influenza B by PCR: NEGATIVE
SARS Coronavirus 2 by RT PCR: NEGATIVE

## 2021-09-12 LAB — TROPONIN I (HIGH SENSITIVITY): Troponin I (High Sensitivity): 8 ng/L (ref <18)

## 2021-09-12 MED ORDER — SODIUM CHLORIDE 0.9 % IV BOLUS
1000.0000 mL | Freq: Once | INTRAVENOUS | Status: AC
  Administered 2021-09-12: 09:00:00 1000 mL via INTRAVENOUS

## 2021-09-12 MED ORDER — ONDANSETRON HCL 4 MG/2ML IJ SOLN
4.0000 mg | Freq: Once | INTRAMUSCULAR | Status: AC
  Administered 2021-09-12: 09:00:00 4 mg via INTRAVENOUS
  Filled 2021-09-12: qty 2

## 2021-09-12 MED ORDER — ONDANSETRON 4 MG PO TBDP: 4.0000 mg | ORAL_TABLET | Freq: Three times a day (TID) | ORAL | 0 refills | Status: DC | PRN

## 2021-09-12 NOTE — ED Provider Notes (Signed)
? ?[facility] ?Provider Note ? ? ? Event Date/Time  ? First MD Initiated Contact with Patient 09/12/21 [phone]   ?  (approximate) ? ?History  ? ?Chief Complaint: Nausea and Emesis ? ?HPI ? ?Latoya Sloan is a 88 y.o. female with a past medical history of hypertension, presents to the emergency department for nausea vomiting.  According to the patient she woke up around 530.  Patient states she had 2 normal bowel movements this morning then began feeling nauseated and had several episodes of vomiting.  Patient states she has not vomited in many years which concerned her so she came to the emergency department.  Patient states she is worried that she could have COVID although denies any fever cough or congestion.  Patient denies any diarrhea.  Denies any black or bloody stool or vomit.  Denies any abdominal pain.  Patient states she is feeling better.  No chest pain or shortness of breath at any point. ? ?Physical Exam  ? ?Triage Vital Signs: ?ED Triage Vitals  ?Enc Vitals Group  ?   BP 09/12/21 0835 (!) 164/62  ?   Pulse Rate 09/12/21 0835 (!) 59  ?   Resp 09/12/21 0835 17  ?   Temp 09/12/21 0835 97.9 ?F (36.6 ?C)  ?   Temp Source 09/12/21 0835 Oral  ?   SpO2 09/12/21 0835 98 %  ?   Weight 09/12/21 0834 260 lb 2.3 oz (118 kg)  ?   Height 09/12/21 0834 5\' 7"  (1.702 m)  ?   Head Circumference --   ?   Peak Flow --   ?   Pain Score 09/12/21 0834 0  ?   Pain Loc --   ?   Pain Edu? --   ?   Excl. in [place]? --   ? ? ?Most recent vital signs: ?Vitals:  ? 09/12/21 0835  ?BP: (!) 164/62  ?Pulse: (!) 59  ?Resp: 17  ?Temp: 97.9 ?F (36.6 ?C)  ?SpO2: 98%  ? ? ?General: Awake, no distress.  ?CV:  Good peripheral perfusion.  Regular rate and rhythm  ?Resp:  Normal effort.  Equal breath sounds bilaterally.  ?Abd:  No distention.  Soft, nontender.  No rebound or guarding. ? ? ?ED Results / Procedures / Treatments  ? ?EKG ? ?EKG viewed and interpreted by myself shows normal sinus rhythm at 63 bpm with a widened QRS,  normal axis, largely normal intervals nonspecific ST changes. ? ? ?MEDICATIONS ORDERED IN ED: ?Medications  ?sodium chloride 0.9 % bolus 1,000 mL (has no administration in time range)  ?ondansetron (ZOFRAN) injection 4 mg (has no administration in time range)  ? ? ? ?IMPRESSION / MDM / ASSESSMENT AND PLAN / ED COURSE  ?I reviewed the triage vital signs and the nursing notes. ? ?Patient presents to the emergency department for nausea and vomiting this morning.  Patient woke up around 530 this morning had 2 bowel movements which she states was normal and then felt nauseated and vomited.  Overall the patient appears quite well.  Has a benign abdominal exam, reassuring physical exam and reassuring vitals.  Given the patient's nausea this morning as well as several episodes of vomiting we will check labs including electrolytes.  Denies any chest pain we will obtain an EKG and troponin as a precaution.  Denies any fever cough to the patient is concerned about COVID we will check a COVID/influenza test.  We will IV hydrate and treat with Zofran while awaiting  further results.  Patient states currently she feels well with no complaints of symptoms. ? ?Patient's work-up is overall reassuring.  Chemistry is resulted largely within normal limits.  Normal CBC.  Normal lipase.  Normal appearing urinalysis.  Negative COVID/flu.  Troponin negative.  Patient states she is feeling better and does not had any further nausea or vomiting.  We will discharge the patient home with Zofran to use if needed.  Patient agreeable to plan of care. ? ?FINAL CLINICAL IMPRESSION(S) / ED DIAGNOSES  ? ?Nausea vomiting ? ?Rx / DC Orders  ? ?Zofran ? ?Note:  This document was prepared using Dragon voice recognition software and may include unintentional dictation errors. ?  Minna Antis, MD ?09/12/21 1205 ? ?

## 2021-09-12 NOTE — ED Triage Notes (Signed)
Pt comes into the ED via POV c/o nausea and emesis that started this morning when she woke up.  Pt denies any abd pain.  Pt denies any CP, SHOB, or dizziness.  PT also denies any diarrhea.  PT in NAD at this time with even and unlabored respirations.  ?

## 2021-10-06 IMAGING — DX DG CHEST 1V PORT
1 series · 1 of 1 positions shown · non-contrast
Comparison: None.

CLINICAL DATA: Lower leg swelling for 2 months

EXAM:
PORTABLE CHEST 1 VIEW

[chest ap]
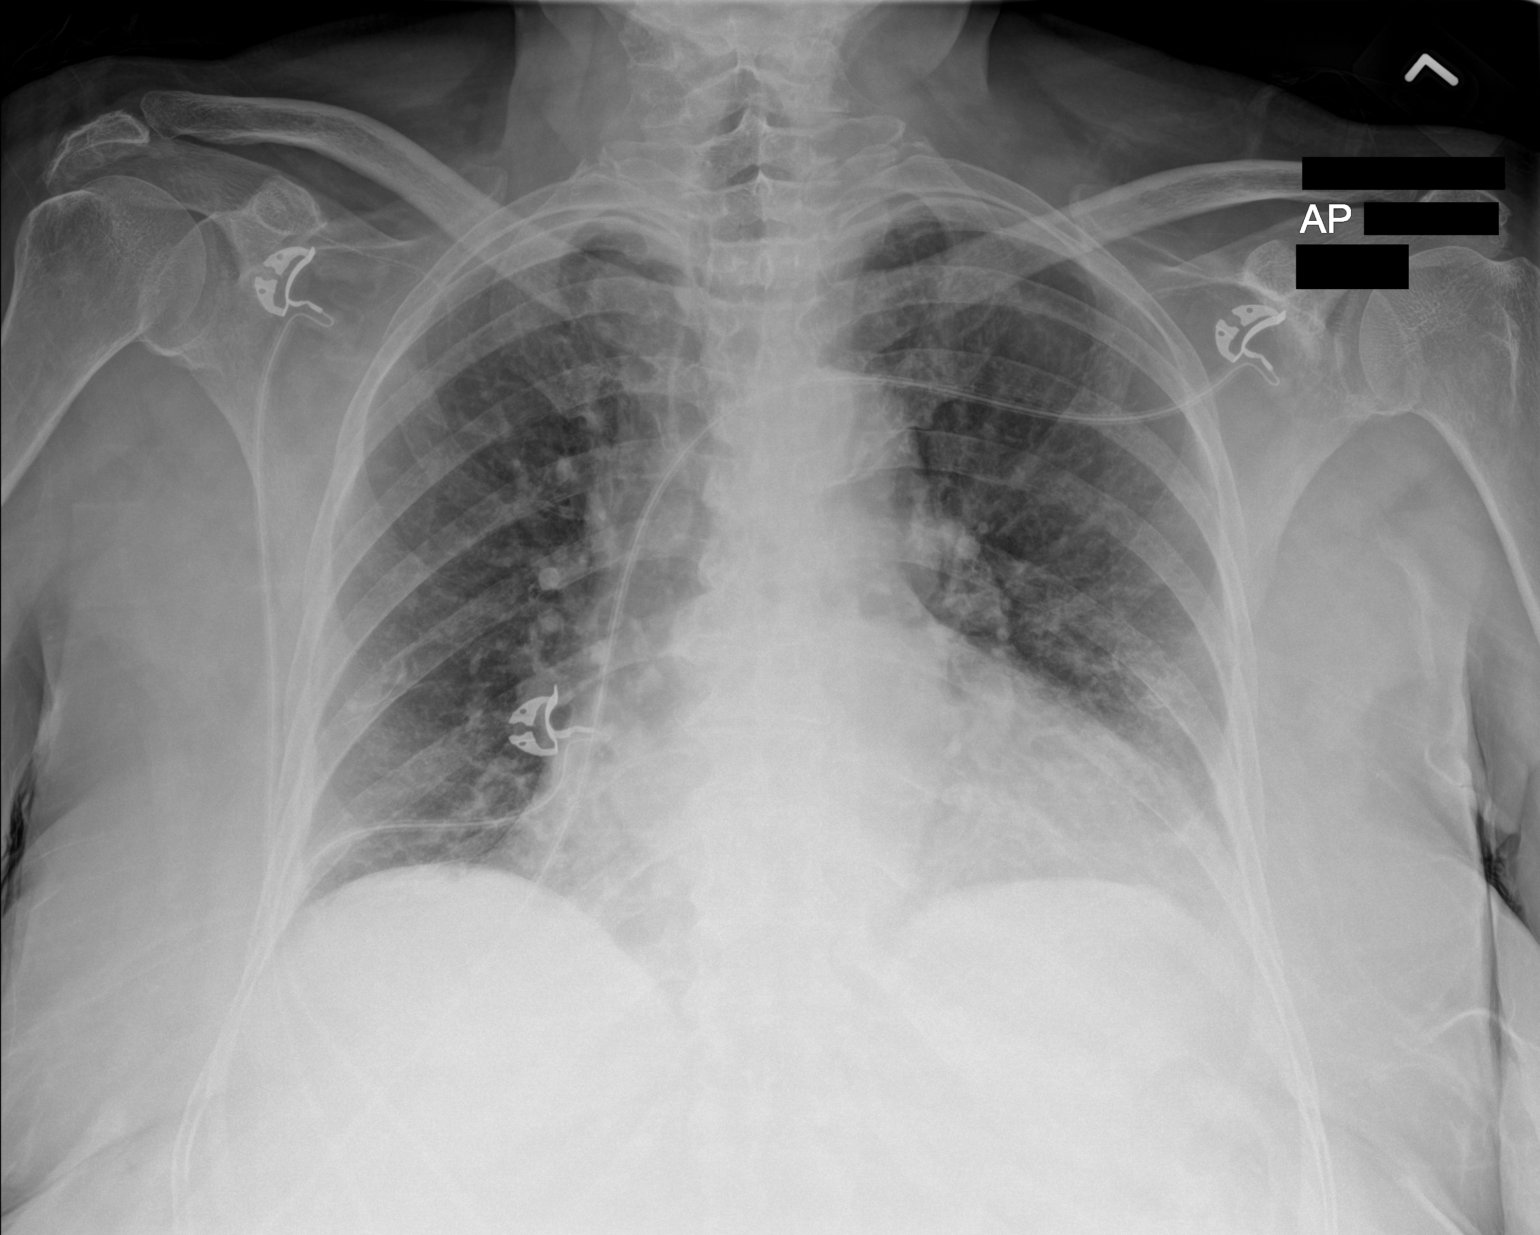

[1 of 1 positions shown; findings below may reference images not displayed]

FINDINGS: Cardiomegaly, vascular congestion. Interstitial prominence could
reflect early interstitial edema. No effusions or acute bony
abnormality.
IMPRESSION: Cardiomegaly.  Suspect early interstitial edema.

## 2021-11-25 ENCOUNTER — Encounter (INDEPENDENT_AMBULATORY_CARE_PROVIDER_SITE_OTHER): Payer: Self-pay | Admitting: Nurse Practitioner

## 2021-11-25 ENCOUNTER — Ambulatory Visit (INDEPENDENT_AMBULATORY_CARE_PROVIDER_SITE_OTHER): Payer: Medicare Other | Admitting: Nurse Practitioner

## 2021-11-25 VITALS — BP 162/72 | HR 59 | Resp 17 | Ht 67.0 in | Wt 254.0 lb

## 2021-11-25 DIAGNOSIS — I890 Lymphedema, not elsewhere classified: Secondary | ICD-10-CM | POA: Diagnosis not present

## 2021-11-25 DIAGNOSIS — I10 Essential (primary) hypertension: Secondary | ICD-10-CM

## 2021-12-02 ENCOUNTER — Encounter (INDEPENDENT_AMBULATORY_CARE_PROVIDER_SITE_OTHER): Payer: Self-pay | Admitting: Nurse Practitioner

## 2021-12-02 NOTE — Progress Notes (Signed)
Subjective:    Patient ID: Latoya Sloan, female    DOB: January 31, 1932, 88 y.o.   MRN: [id number] No chief complaint on file.   The patient returns to the office for followup evaluation regarding leg swelling.  The swelling has improved quite a bit and the pain associated with swelling has decreased substantially. There have not been any interval development of a ulcerations or wounds.  Since the previous visit the patient has been wearing graduated compression stockings and has noted some improvement in the lymphedema. The patient has been using compression routinely morning until night.  The patient also states elevation during the day and exercise (such as walking) is being done too.       Review of Systems  Cardiovascular:  Positive for leg swelling.  All other systems reviewed and are negative.     Objective:   Physical Exam Vitals reviewed.  HENT:     Head: Normocephalic.  Cardiovascular:     Rate and Rhythm: Normal rate.  Pulmonary:     Effort: Pulmonary effort is normal.  Musculoskeletal:     Right lower leg: 2+ Edema present.     Left lower leg: 2+ Edema present.  Skin:    General: Skin is warm and dry.  Neurological:     Mental Status: She is alert and oriented to person, place, and time.  Psychiatric:        Mood and Affect: Mood normal.        Behavior: Behavior normal.        Thought Content: Thought content normal.        Judgment: Judgment normal.    BP (!) 162/72 (BP Location: Left Arm)   Pulse (!) 59   Resp 17   Ht 5\' 7"  (1.702 m)   Wt 254 lb (115.2 kg)   BMI 39.78 kg/m   Past Medical History:  Diagnosis Date   Hypertension    Lymphedema of both lower extremities    Thyroid disease     Social History   Socioeconomic History   Marital status: Widowed    Spouse name: Not on file   Number of children: Not on file   Years of education: Not on file   Highest education level: Not on file  Occupational History   Not on file  Tobacco Use    Smoking status: Never   Smokeless tobacco: Never  Substance and Sexual Activity   Alcohol use: Not Currently   Drug use: Not Currently   Sexual activity: Not on file  Other Topics Concern   Not on file  Social History Narrative   Not on file   Social Determinants of Health   Financial Resource Strain: Not on file  Food Insecurity: Not on file  Transportation Needs: Not on file  Physical Activity: Not on file  Stress: Not on file  Social Connections: Not on file  Intimate Partner Violence: Not on file    History reviewed. No pertinent surgical history.  History reviewed. No pertinent family history.  No Known Allergies     Latest Ref Rng & Units 09/12/2021    8:51 AM 04/07/2021   11:53 AM 12/08/2020    9:53 AM  CBC  WBC 4.0 - 10.5 K/uL 8.2   6.1   9.0    Hemoglobin 12.0 - 15.0 g/dL 02/07/2021   87.5   64.3    Hematocrit 36.0 - 46.0 % 36.7   36.8   33.4    Platelets 150 -  400 K/uL 190   218   191        CMP     Component Value Date/Time   NA 142 09/12/2021 0851   K 4.6 09/12/2021 0851   CL 105 09/12/2021 0851   CO2 28 09/12/2021 0851   GLUCOSE 112 (H) 09/12/2021 0851   BUN 28 (H) 09/12/2021 0851   CREATININE 1.03 (H) 09/12/2021 0851   CALCIUM 9.3 09/12/2021 0851   PROT 6.8 09/12/2021 0851   ALBUMIN 3.8 09/12/2021 0851   AST 26 09/12/2021 0851   ALT 16 09/12/2021 0851   ALKPHOS 75 09/12/2021 0851   BILITOT 1.3 (H) 09/12/2021 0851   GFRNONAA 53 (L) 09/12/2021 0851   GFRAA >60 03/01/2020 1618     No results found.     Assessment & Plan:   1. Lymphedema of both lower extremities Recommend:  No surgery or intervention at this point in time.    I have reviewed my discussion with the patient regarding lymphedema and why it  causes symptoms.  Patient will continue wearing graduated compression on a daily basis. The patient should put the compression on first thing in the morning and removing them in the evening. The patient should not sleep in the compression.    In addition, behavioral modification throughout the day will be continued.  This will include frequent elevation (such as in a recliner), use of over the counter pain medications as needed and exercise such as walking.  The systemic causes for chronic edema such as liver, kidney and cardiac etiologies does not appear to have significant changed over the past year.    The patient will continue aggressive use of the  lymph pump.  This will continue to improve the edema control and prevent sequela such as ulcers and infections.   The patient will follow-up in 6 months  2. Essential hypertension Continue antihypertensive medications as already ordered, these medications have been reviewed and there are no changes at this time.    Current Outpatient Medications on File Prior to Visit  Medication Sig Dispense Refill   aspirin 81 MG chewable tablet Chew 81 mg by mouth daily.     atorvastatin (LIPITOR) 40 MG tablet Take 40 mg by mouth daily.     Cholecalciferol 50 MCG (2000 UT) CAPS Take 2,000 Units by mouth daily.     furosemide (LASIX) 20 MG tablet Take by mouth.     levothyroxine (SYNTHROID) 25 MCG tablet Take 25 mcg by mouth daily.     losartan (COZAAR) 100 MG tablet Take 100 mg by mouth daily.     multivitamin-iron-minerals-folic acid (CENTRUM) chewable tablet Chew 1 tablet by mouth daily.     omeprazole (PRILOSEC) 40 MG capsule Take 40 mg by mouth daily.     potassium chloride SA (KLOR-CON M) 20 MEQ tablet Take by mouth.     No current facility-administered medications on file prior to visit.    There are no Patient Instructions on file for this visit. No follow-ups on file.   Georgiana Spinner, NP

## 2021-12-17 ENCOUNTER — Ambulatory Visit (INDEPENDENT_AMBULATORY_CARE_PROVIDER_SITE_OTHER): Payer: Medicare Other | Admitting: Nurse Practitioner

## 2022-04-06 ENCOUNTER — Other Ambulatory Visit: Payer: Self-pay

## 2022-04-06 ENCOUNTER — Emergency Department
Admission: EM | Admit: 2022-04-06 | Discharge: 2022-04-06 | Disposition: A | Discharge status: Home / Self Care | Payer: Medicare Other | Attending: Emergency Medicine | Admitting: Emergency Medicine

## 2022-04-06 DIAGNOSIS — N189 Chronic kidney disease, unspecified: Secondary | ICD-10-CM | POA: Insufficient documentation

## 2022-04-06 DIAGNOSIS — K5900 Constipation, unspecified: Secondary | ICD-10-CM | POA: Diagnosis not present

## 2022-04-06 DIAGNOSIS — I130 Hypertensive heart and chronic kidney disease with heart failure and stage 1 through stage 4 chronic kidney disease, or unspecified chronic kidney disease: Secondary | ICD-10-CM | POA: Diagnosis not present

## 2022-04-06 DIAGNOSIS — K625 Hemorrhage of anus and rectum: Secondary | ICD-10-CM | POA: Diagnosis present

## 2022-04-06 DIAGNOSIS — K649 Unspecified hemorrhoids: Secondary | ICD-10-CM | POA: Insufficient documentation

## 2022-04-06 DIAGNOSIS — I509 Heart failure, unspecified: Secondary | ICD-10-CM | POA: Insufficient documentation

## 2022-04-06 LAB — COMPREHENSIVE METABOLIC PANEL
ALT: 16 U/L (ref 0–44)
AST: 18 U/L (ref 15–41)
Albumin: 3.9 g/dL (ref 3.5–5.0)
Alkaline Phosphatase: 78 U/L (ref 38–126)
Anion gap: 7 (ref 5–15)
BUN: 31 mg/dL — ABNORMAL HIGH (ref 8–23)
CO2: 28 mmol/L (ref 22–32)
Calcium: 9.2 mg/dL (ref 8.9–10.3)
Chloride: 108 mmol/L (ref 98–111)
Creatinine, Ser: 1.26 mg/dL — ABNORMAL HIGH (ref 0.44–1.00)
GFR, Estimated: 41 mL/min — ABNORMAL LOW (ref >60)
Glucose, Bld: 93 mg/dL (ref 70–99)
Potassium: 4.2 mmol/L (ref 3.5–5.1)
Sodium: 143 mmol/L (ref 135–145)
Total Bilirubin: 1.1 mg/dL (ref 0.3–1.2)
Total Protein: 7.1 g/dL (ref 6.5–8.1)

## 2022-04-06 LAB — TYPE AND SCREEN
ABO/RH(D): O POS
Antibody Screen: NEGATIVE

## 2022-04-06 LAB — CBC
HCT: 35 % — ABNORMAL LOW (ref 36.0–46.0)
Hemoglobin: 11 g/dL — ABNORMAL LOW (ref 12.0–15.0)
MCH: 26.6 pg (ref 26.0–34.0)
MCHC: 31.4 g/dL (ref 30.0–36.0)
MCV: 84.5 fL (ref 80.0–100.0)
Platelets: 216 10*3/uL (ref 150–400)
RBC: 4.14 MIL/uL (ref 3.87–5.11)
RDW: 16.2 % — ABNORMAL HIGH (ref 11.5–15.5)
WBC: 5.6 10*3/uL (ref 4.0–10.5)
nRBC: 0 % (ref 0.0–0.2)

## 2022-04-06 MED ORDER — BISACODYL 10 MG RE SUPP: 10.0000 mg | RECTAL | 0 refills | Status: AC | PRN

## 2022-04-06 MED ORDER — SENNOSIDES-DOCUSATE SODIUM 8.6-50 MG PO TABS: 2.0000 | ORAL_TABLET | Freq: Two times a day (BID) | ORAL | 0 refills | Status: AC

## 2022-04-06 MED ORDER — POLYETHYLENE GLYCOL 3350 17 GM/SCOOP PO POWD: ORAL | 0 refills | Status: AC

## 2022-04-06 NOTE — ED Notes (Signed)
Blue top sent with other labs. 

## 2022-04-06 NOTE — ED Provider Notes (Signed)
[facility] Provider Note    Event Date/Time   First MD Initiated Contact with Patient 04/06/22 1704     (approximate)   History   Chief Complaint: Rectal Bleeding   HPI  Latoya Sloan is a 88 y.o. female with a history of hypertension, lymphedema, CHF who comes to the ED complaining of rectal bleeding with a bowel movement today at about 1 PM.  She denies this ever happening before, does not take blood thinners.  She reports constipation for the last several days and has been spending a lot of time on the toilet with a lot of straining trying to have a bowel movement.  No vomiting or abdominal pain.  No dysuria.  Today while straining on the toilet, she noticed a small amount of red blood in the toilet.  Denies vaginal bleeding.     Physical Exam   Triage Vital Signs: ED Triage Vitals  Enc Vitals Group     BP 04/06/22 1535 (!) 197/70     Pulse Rate 04/06/22 1535 (!) 56     Resp 04/06/22 1535 16     Temp 04/06/22 1535 97.9 F (36.6 C)     Temp Source 04/06/22 1535 Oral     SpO2 04/06/22 1535 98 %     Weight 04/06/22 1537 250 lb (113.4 kg)     Height 04/06/22 1537 5\' 7"  (1.702 m)     Head Circumference --      Peak Flow --      Pain Score 04/06/22 1536 1     Pain Loc --      Pain Edu? --      Excl. in [place]? --     Most recent vital signs: Vitals:   04/06/22 1535  BP: (!) 197/70  Pulse: (!) 56  Resp: 16  Temp: 97.9 F (36.6 C)  SpO2: 98%    General: Awake, no distress.  CV:  Good peripheral perfusion.  Resp:  Normal effort.  Abd:  No distention.  Soft nontender Other:  Rectal exam performed with nurse Caryl Pina at bedside.  There is an external hemorrhoid which is somewhat engorged, noninflamed and nontender.  Digital rectal exam reveals firm stool without impaction, no tenderness, no blood in the vault.  Hemoccult negative.   ED Results / Procedures / Treatments   Labs (all labs ordered are listed, but only abnormal results are  displayed) Labs Reviewed  COMPREHENSIVE METABOLIC PANEL - Abnormal; Notable for the following components:      Result Value   BUN 31 (*)    Creatinine, Ser 1.26 (*)    GFR, Estimated 41 (*)    All other components within normal limits  CBC - Abnormal; Notable for the following components:   Hemoglobin 11.0 (*)    HCT 35.0 (*)    RDW 16.2 (*)    All other components within normal limits  POC OCCULT BLOOD, ED  TYPE AND SCREEN     EKG    RADIOLOGY    PROCEDURES:  Procedures   MEDICATIONS ORDERED IN ED: Medications - No data to display   IMPRESSION / MDM / [place] / ED COURSE  I reviewed the triage vital signs and the nursing notes.                              Differential diagnosis includes, but is not limited to, bleeding hemorrhoid, constipation, electrolyte abnormality, anemia,  dehydration/AKI  Patient's presentation is most consistent with acute presentation with potential threat to life or bodily function.  Patient presents with an episode of rectal bleeding which clinically appears to be due to hemorrhoid from straining in the setting of constipation.  Labs are reassuring, she has baseline chronic anemia and CKD.  Electrolytes unremarkable.  Patient advised on Senokot MiraLAX Dulcolax, ensure adequate fluids.  She does not require admission and can be discharged to primary care follow-up.  Recheck of blood pressure was 160/80.       FINAL CLINICAL IMPRESSION(S) / ED DIAGNOSES   Final diagnoses:  Bleeding hemorrhoid  Constipation, unspecified constipation type     Rx / DC Orders   ED Discharge Orders          Ordered    senna-docusate (SENOKOT-S) 8.6-50 MG tablet  2 times daily        04/06/22 1736    polyethylene glycol powder (GLYCOLAX/MIRALAX) 17 GM/SCOOP powder        04/06/22 1736    bisacodyl (DULCOLAX) 10 MG suppository  As needed        04/06/22 1736             Note:  This document was prepared using Dragon voice  recognition software and may include unintentional dictation errors.   Sharman Cheek, MD 04/06/22 1739

## 2022-04-06 NOTE — ED Notes (Addendum)
Rectal exam by Dr. Joni Fears EDP assisted by this RN. POC Hemoccult negative.

## 2022-04-06 NOTE — ED Triage Notes (Signed)
Pt to ED POV with daughter for red blood in stool today. Has not had this in past. Pt unsure if blood was bright or dark red but was straining for BM today at 1pm and then noticed blood in toilet. Hx constipation. Also complains of chronic intermittent lumbar back pain. VSS.

## 2022-05-05 ENCOUNTER — Encounter (INDEPENDENT_AMBULATORY_CARE_PROVIDER_SITE_OTHER): Payer: Self-pay

## 2022-05-28 ENCOUNTER — Ambulatory Visit (INDEPENDENT_AMBULATORY_CARE_PROVIDER_SITE_OTHER): Payer: Medicare Other | Admitting: Nurse Practitioner

## 2022-06-09 ENCOUNTER — Ambulatory Visit (INDEPENDENT_AMBULATORY_CARE_PROVIDER_SITE_OTHER): Payer: Medicare Other | Admitting: Nurse Practitioner

## 2022-06-09 ENCOUNTER — Encounter (INDEPENDENT_AMBULATORY_CARE_PROVIDER_SITE_OTHER): Payer: Self-pay | Admitting: Nurse Practitioner

## 2022-06-09 VITALS — BP 144/64 | HR 52 | Ht 67.0 in | Wt 257.2 lb

## 2022-06-09 DIAGNOSIS — I890 Lymphedema, not elsewhere classified: Secondary | ICD-10-CM

## 2022-06-09 DIAGNOSIS — E785 Hyperlipidemia, unspecified: Secondary | ICD-10-CM | POA: Diagnosis not present

## 2022-06-09 DIAGNOSIS — I10 Essential (primary) hypertension: Secondary | ICD-10-CM | POA: Diagnosis not present

## 2022-06-21 ENCOUNTER — Encounter (INDEPENDENT_AMBULATORY_CARE_PROVIDER_SITE_OTHER): Payer: Self-pay | Admitting: Nurse Practitioner

## 2022-06-21 NOTE — Progress Notes (Signed)
Subjective:    Patient ID: Latoya Sloan, female    DOB: 1932/03/08, 88 y.o.   MRN: [id number] Chief Complaint  Patient presents with   Lymphoma    Lower extremities    The patient returns to the office for followup evaluation regarding leg swelling.  The swelling has improved quite a bit and the pain associated with swelling has decreased substantially. There have not been any interval development of a ulcerations or wounds.   Since the previous visit the patient has been wearing graduated compression stockings and has noted some improvement in the lymphedema. The patient has been using compression routinely morning until night.   The patient also states elevation during the day and exercise (such as walking) is being done too. Despite the improvement in symptoms patient is still unsatisfied with the amount of swelling she continues to have.    Review of Systems  Cardiovascular:  Positive for leg swelling.  All other systems reviewed and are negative.      Objective:   Physical Exam Vitals reviewed.  HENT:     Head: Normocephalic.  Cardiovascular:     Rate and Rhythm: Normal rate.  Pulmonary:     Effort: Pulmonary effort is normal.  Musculoskeletal:     Right lower leg: Edema present.     Left lower leg: Edema present.  Skin:    General: Skin is warm and dry.  Neurological:     Mental Status: She is alert and oriented to person, place, and time.  Psychiatric:        Mood and Affect: Mood normal.        Behavior: Behavior normal.        Thought Content: Thought content normal.        Judgment: Judgment normal.     BP (!) 144/64   Pulse (!) 52   Ht 5\' 7"  (1.702 m)   Wt 257 lb 3.2 oz (116.7 kg)   BMI 40.28 kg/m   Past Medical History:  Diagnosis Date   Hypertension    Lymphedema of both lower extremities    Thyroid disease     Social History   Socioeconomic History   Marital status: Widowed    Spouse name: Not on file   Number of children: Not on file    Years of education: Not on file   Highest education level: Not on file  Occupational History   Not on file  Tobacco Use   Smoking status: Never   Smokeless tobacco: Never  Substance and Sexual Activity   Alcohol use: Not Currently   Drug use: Not Currently   Sexual activity: Not on file  Other Topics Concern   Not on file  Social History Narrative   Not on file   Social Determinants of Health   Financial Resource Strain: Not on file  Food Insecurity: Not on file  Transportation Needs: Not on file  Physical Activity: Not on file  Stress: Not on file  Social Connections: Not on file  Intimate Partner Violence: Not on file    History reviewed. No pertinent surgical history.  History reviewed. No pertinent family history.  No Known Allergies     Latest Ref Rng & Units 04/06/2022    3:41 PM 09/12/2021    8:51 AM 04/07/2021   11:53 AM  CBC  WBC 4.0 - 10.5 K/uL 5.6  8.2  6.1   Hemoglobin 12.0 - 15.0 g/dL 06/07/2021  44.8  18.5   Hematocrit 36.0 -  46.0 % 35.0  36.7  36.8   Platelets 150 - 400 K/uL 216  190  218       CMP     Component Value Date/Time   NA 143 04/06/2022 1541   K 4.2 04/06/2022 1541   CL 108 04/06/2022 1541   CO2 28 04/06/2022 1541   GLUCOSE 93 04/06/2022 1541   BUN 31 (H) 04/06/2022 1541   CREATININE 1.26 (H) 04/06/2022 1541   CALCIUM 9.2 04/06/2022 1541   PROT 7.1 04/06/2022 1541   ALBUMIN 3.9 04/06/2022 1541   AST 18 04/06/2022 1541   ALT 16 04/06/2022 1541   ALKPHOS 78 04/06/2022 1541   BILITOT 1.1 04/06/2022 1541   GFRNONAA 41 (L) 04/06/2022 1541   GFRAA >60 03/01/2020 1618     No results found.     Assessment & Plan:   1. Lymphedema of both lower extremities The patient continues to do well with her conservative therapy including use of compression garments and elevation.  She also utilizes her lymphedema pump.  However she is unhappy with the swelling control that conservative therapy is yielding.  Based on this we will refer the  patient to the lymphedema clinic for additional tactics to see if this may improve her lower extremity edema. - Ambulatory referral to Occupational Therapy  2. Essential hypertension Continue antihypertensive medications as already ordered, these medications have been reviewed and there are no changes at this time.  3. Hyperlipidemia, unspecified hyperlipidemia type Continue statin as ordered and reviewed, no changes at this time   Current Outpatient Medications on File Prior to Visit  Medication Sig Dispense Refill   aspirin 81 MG chewable tablet Chew 81 mg by mouth daily.     atorvastatin (LIPITOR) 40 MG tablet Take 40 mg by mouth daily.     bisacodyl (DULCOLAX) 10 MG suppository Place 1 suppository (10 mg total) rectally as needed for moderate constipation. 12 suppository 0   Cholecalciferol 50 MCG (2000 UT) CAPS Take 2,000 Units by mouth daily.     furosemide (LASIX) 20 MG tablet Take by mouth.     levothyroxine (SYNTHROID) 25 MCG tablet Take 25 mcg by mouth daily.     losartan (COZAAR) 100 MG tablet Take 100 mg by mouth daily.     multivitamin-iron-minerals-folic acid (CENTRUM) chewable tablet Chew 1 tablet by mouth daily.     omeprazole (PRILOSEC) 40 MG capsule Take 40 mg by mouth daily.     polyethylene glycol powder (GLYCOLAX/MIRALAX) 17 GM/SCOOP powder 1 cap full in a full glass of water, two times a day for 3 days. 255 g 0   potassium chloride SA (KLOR-CON M) 20 MEQ tablet Take by mouth.     senna-docusate (SENOKOT-S) 8.6-50 MG tablet Take 2 tablets by mouth 2 (two) times daily. 120 tablet 0   No current facility-administered medications on file prior to visit.    There are no Patient Instructions on file for this visit. No follow-ups on file.   Georgiana Spinner, NP

## 2022-07-15 IMAGING — DX DG CHEST 1V PORT
1 series · 1 of 1 positions shown · non-contrast
Comparison: March 01, 2020

CLINICAL DATA: Persistent cough

EXAM:
PORTABLE CHEST 1 VIEW

[chest ap]
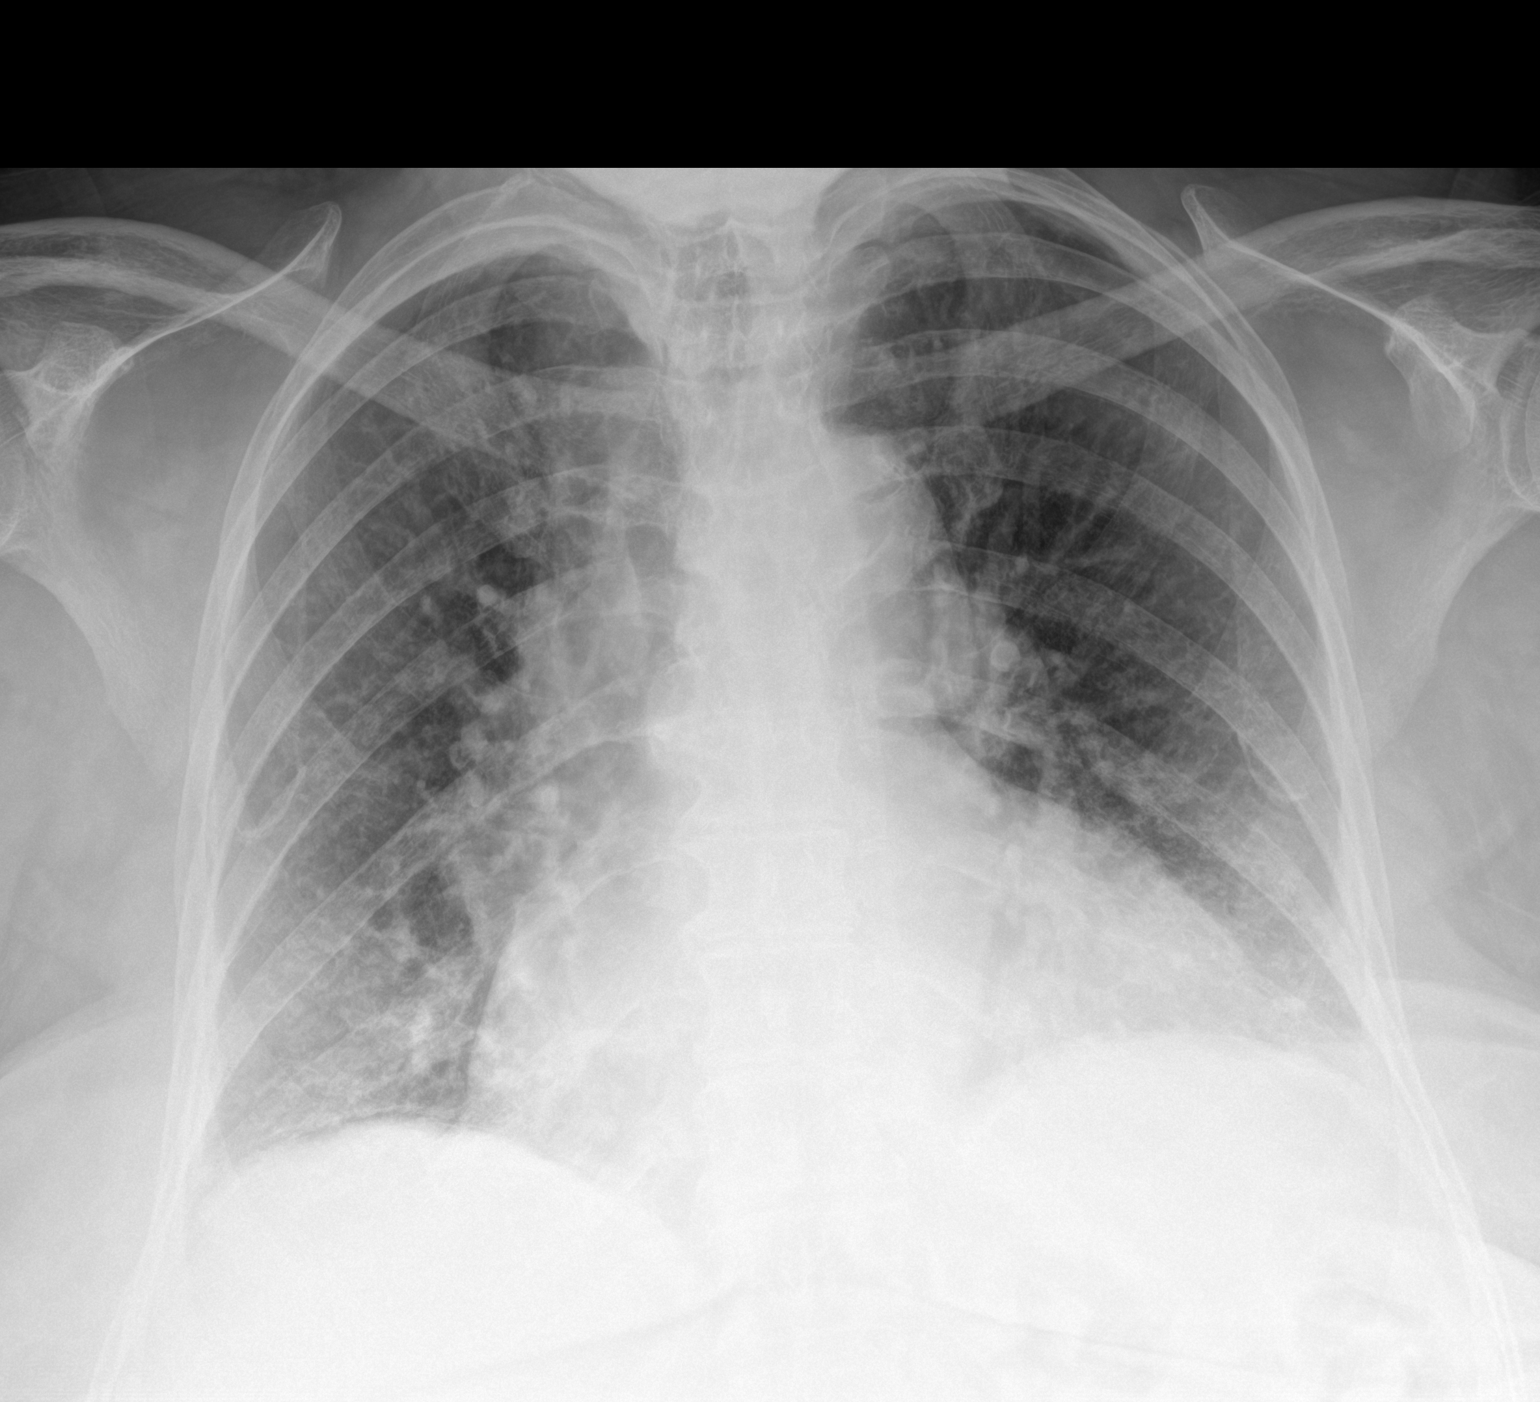

[1 of 1 positions shown; findings below may reference images not displayed]

FINDINGS: There is cardiomegaly with pulmonary vascularity normal. There is
ill-defined airspace opacity in each lung base without
consolidation. No adenopathy. There is aortic atherosclerosis. No
bone lesions.
IMPRESSION: Ill-defined airspace opacity in the lung bases concerning for
bibasilar pneumonia. A degree of interstitial pulmonary edema could
present in this manner. The appearance of the lung bases raises
concern for potential atypical organism pneumonia. Check of [id number]
status advised.

Cardiomegaly with pulmonary vascularity normal. Aortic
Atherosclerosis ([id number]).

## 2022-07-29 ENCOUNTER — Encounter: Payer: Self-pay | Admitting: Occupational Therapy

## 2022-07-29 ENCOUNTER — Ambulatory Visit: Payer: Medicare Other | Attending: Nurse Practitioner | Admitting: Occupational Therapy

## 2022-07-29 DIAGNOSIS — I890 Lymphedema, not elsewhere classified: Secondary | ICD-10-CM | POA: Diagnosis not present

## 2022-07-29 NOTE — Therapy (Signed)
OUTPATIENT OCCUPATIONAL THERAPY LOWER EXTREMITY LYMPHEDEMA EVALUATION  Patient Name: Latoya Sloan MRN: [id number] DOB:30-Dec-1931, 89 y.o., female Today's Date: 08/05/2022  END OF SESSION:  OT End of Session - 08/05/22 1516     Visit Number 1    Number of Visits 36    Date for OT Re-Evaluation 10/27/22    OT Start Time 0813    OT Stop Time 0915    OT Time Calculation (min) 62 min    Activity Tolerance Patient tolerated treatment well;No increased pain    Behavior During Therapy [facility] for tasks assessed/performed              OT End of Session - 08/05/22 1516     Visit Number 1    Number of Visits 36    Date for OT Re-Evaluation 10/27/22    OT Start Time 0813    OT Stop Time 0915    OT Time Calculation (min) 62 min    Activity Tolerance Patient tolerated treatment well;No increased pain    Behavior During Therapy WFL for tasks assessed/performed                 Past Medical History:  Diagnosis Date   Hypertension    Lymphedema of both lower extremities    Thyroid disease    History reviewed. No pertinent surgical history. Patient Active Problem List   Diagnosis Date Noted   Bradycardia 01/16/2021   Chronic diastolic CHF (congestive heart failure), NYHA class 2 (HCC) 04/06/2020   Leg numbness 03/20/2020   Leg swelling 03/20/2020   Lymphedema of both lower extremities 03/20/2020   Anosmia 03/06/2020   Bilateral carotid artery stenosis 03/06/2020   Chronic cough 03/06/2020   Dyspnea on exertion 03/06/2020   GERD (gastroesophageal reflux disease) 03/06/2020   Hyperlipidemia 03/06/2020   Lymphedema 03/06/2020   Migraines 03/06/2020   Osteoarthritis of both knees 03/06/2020   Idiopathic peripheral neuropathy 12/29/2013   Localized, primary osteoarthritis 12/28/2013   Osteoarthritis of knee 12/28/2013   Essential hypertension 10/23/2010   Family history of stroke 10/22/2010    PCP: Enid Baas, MD  REFERRING PROVIDER: Sheppard Plumber, NP  REFERRING  DIAG: I89.0  THERAPY DIAG:  Lymphedema, not elsewhere classified  Rationale for Evaluation and Treatment: Rehabilitation  ONSET DATE: >10 yrs  SUBJECTIVE:                                                                                                                                                                                           SUBJECTIVE STATEMENT: Latoya Sloan is referred to Occupational Therapy by Sheppard Plumber for BLE lymphedema evaluation and treatment. Pt reports  onset of leg swelling > 10 years ago. Family hx of limb swelling is unknown to her. Leg swelling has worsened over time and worsens as the day goes on by report. Despite a long course of conservative treatment at [facility], including weekly Unna boots, compression socks, and a "pump" Pt reports legs continue to stay swollen and hurt. She rates LE-related pain at 5/10 today. Pt wears adjustable compression alternatives (Velcro wrap style leggings) to clinic today .She expresses interest in reducing swelling and pain beyond what she has achieved this far.   PERTINENT HISTORY: CHF, chronic , progressive lymphedema BLE, thyroid fdosease, Bradycardia, B knee OA, Periferal neuropathy, B carotid stenosis  PAIN:  Are you having pain? Yes: NPRS scale: 5/10 Pain location: R knee Pain description: sore, tender, heavy, tight, achy Aggravating factors: weight bearing Relieving factors: sitting, elevation, compression  PRECAUTIONS: Other: THYROID, B CAROTID ARTERY STENOSIS (NO strokes to neck!) , CHF, BRADYCARDIA  WEIGHT BEARING RESTRICTIONS: No  FALLS:  Has patient fallen in last 6 months? No  LIVING ENVIRONMENT: Lives with: [facility] Lives in: Other [employer]stud[employer] apartment Stairs: No;  Has following equipment at home: Environmental consultant - 4 wheeled, Electronics engineer, and Grab bars  OCCUPATION: retired from domestic work, [employer] work  LEISURE: Company secretary, [employer]  HAND DOMINANCE: right   [place]:  Independent with basic ADLs, Independent with household mobility with device, Independent with community mobility with device, and Needs assistance with homemaking  PATIENT GOALS: "Because of the swelling, and I want to walk more. 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  Description Hyper-Keratosis Peau' de Orange Shiny Tight Fibrotic/ Indurated Fatty Doughy Spongy/ boggy       x x  x   Skin dry Flaky WNL Macerated   mildly x     Color Redness Varicosities Blanching Hemosiderin Stain Mottled        x   Odor Malodorous Yeast Fungal infection  WNL      x   Temperature Warm Cool WNL   @ medial R knee x     Pitting Edema   1+ 2+ 3+ 4+ Non-pitting         x   Girth Symmetrical Asymmetrical                   Distribution    R>L Below knees    Stemmer Sign Positive Negative   Trace, feet largely spared    Lymphorrhea History Of:  Present Absent     x    Wounds History Of Present Absent Venous Arterial Pressure Sheer     x        Signs of Infection Absent Warmth Erythema Acute Swelling Drainage Borders   x                 Sensation Light Touch Deep pressure Hypersensitivity   Present Impaired Present Impaired Absent Impaired   x  x  x     Nails WNL   Fungus nail dystrophy   x     Hair Growth Symmetrical Asymmetrical   x    Skin Creases Base of toes  Ankles   Base of Fingers knees  Abdominal pannus Thigh Lobules  Face/neck              COGNITION:  Overall cognitive status: Within functional limits for tasks assessed. Pt is reliable historian  POSTURE: WNL  LE ROM: knee AROM mildly limited by OA pain and skin approximation due to swelling/ girth   LE STRENGTH: WFL for tasks  performed  LYMPHEDEMA ASSESSMENTS:   HX WOUNDS: denies  Hx non-pressure related INFECTIONS: denied  GAIT: Distance walked: 10 ' Assistive device utilized: None Level of assistance: SBA Comments: transport wheelchair to clinic   LYMPHEDEMA LIFE IMPACT SCALE (LLIS): 41.18%  (The extent to which LE-related problems affected your life in the past week.)  FOTO Outcome Measure: 58%   TODAY'S TREATMENT:                                                                                                                                         DATE:   PATIENT EDUCATION:  Education details: Provided basic level education regarding lymphatic structure and function, etiology, onset patterns, stages of progression, and prevention to limit infection risk, worsening condition and further functional decline. Pt edu for aught interaction between blood circulatory system and lymphatic circulation.Discussed  impact of gravity and co-morbidities on lymphatic function. Outlined Complete Decongestive Therapy (CDT)  as standard of care and provided in depth information regarding 4 primary components of Intensive and Self Management Phases, including Manual Lymph Drainage (MLD), compression wrapping and garments, skin care, and therapeutic exercise. Homero Fellers discussion with re need for frequent attendance and high burden of care when caregiver is needed, impact of co morbidities. We discussed  the chronic, progressive nature of lymphedema and Importance of daily, ongoing LE self-care essential for limiting progression and infection risk.   Pt and CG verbalize understanding that she will need daily assistance throughout Intensive Phase Complete Decongestive Therapy (CDT) with compression wraps as she is unable to reach her distal legs and feet to reapply them. Pt and CG understand that without diligent daily compression    wraps changes and skin care prognosis for volume reduction is poor.   Person educated: Patient,  adult daughter Samuella Cota  Education method: Explanation, Demonstration, Handouts, and needs additional edu Education comprehension: verbalized understanding, returned demonstration, and needs further education  LYMPHEDEMA SELF-CARE HOME PROGRAM 1.Therapeutic lymphatic pumping therex- 2 sets of 10 reps, hold 5 seconds; elements in order. 2 x daily PRN 2.  Full time, daily compression: Intensive Phase CDT: multilayer short stretch bandages from toes to popliteal using gradient techniques         Self-management Phase CDT: Attempt to use existing OTS, light duty adjustable leggings. Consider HOS devices  to limit          fibrosis (Jobst Relax ccl 2) 3. Daily skin care to sustain optimal hydration. Wash bites, scratches, blisters, etc with mild soap and water , apply antibacterial first aide cream  and a band aide. 4. 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 Lipedema occurs primarily in females and as it progresses over time it may result in secondary Lipo-lymphedema. Lipedema-related signs/ symptoms in this case include fatty , doughy, symmetrically swollen  legs w/ sensitivity to touch and vague ankle cuff formations with spared feet. Despite undergoing over a year of conservative treatment with weekly Unna boots, leg elevation and daily use of a basic ( retrograde) sequential (squeeze and release) pneumatic compression "pump device", lipo-lymphedema signs and symptoms persists and impact functional performance in all occupational domains, including functional ambulation and transfers, basic and instrumental ADLs, productive activities, leisure pursuits and social participation. Ms. Balint will benefit from skilled Occupational Therapy for complete  decongestive therapy (CDT), the gold standard of lymphedema care,  to reduce leg swelling and associated pain, to lower infection risk and limit progression. Without OT for CDT, Lipo-lymphedema will progress and further functional decline is expected.  OBJECTIVE IMPAIRMENTS: decreased knowledge of condition, decreased knowledge of use of DME, decreased mobility, difficulty walking, decreased ROM, increased edema, obesity, and pain.   ACTIVITY LIMITATIONS: decreased standing and walking tolerance > 30 minutes,  carrying, lifting, bending, extended dependent sitting, squatting, stairs, transfers, lower body bathing, LB grooming, LB dressing, productive, leisure and social participation requiring walking, standing and/ or dependent sitting >30 minutes ( attending social and community activities)  PERSONAL FACTORS: Age, time since onset, and 3+ co morbidities: CHF, OA, HTN are also affecting patient's functional outcome.   REHAB POTENTIAL: Good- with daily CG assistance with compression wraps during Intensive CDT. Poor without assistance with multilayer wraps  EVALUATION COMPLEXITY: moderate   GOALS: Goals reviewed with patient? Yes  SHORT TERM GOALS: Target date: 4th OT  Rx visit   Pt will demonstrate understanding of lymphedema precautions and prevention strategies with modified independence using a printed reference to identify at least 5 precautions and discussing how s/he may implement them into daily life to reduce risk of progression with modified assistance. Baseline:Max A Goal status: INITIAL  2.   With Max caregiver assistance Pt will be able to apply multilayer, knee length, compression wraps using gradient techniques to decrease limb volume, to limit infection risk, and to limit lymphedema progression.  Baseline:Max A Goal status: INITIAL  LONG TERM GOALS: Target date: 10/27/22  Given this patient's Intake score of 58 /100% on the functional outcomes FOTO tool, patient will  experience an increase in function of 3 points to improve basic and instrumental ADLs performance, including lymphedema self-care. (TBA at first OT Rx visit) Baseline: Dependent Goal status: INITIAL  2.  Given this patient's Intake score of 41.18% on the Lymphedema Life Impact Scale (LLIS), patient will experience a reduction of at least 5% in her perceived level of functional impairment resulting from lymphedema to improve functional performance and quality of life (QOL). Baseline: Dependent Goal status: INITIAL  3.  With modified assistance (extra time) Pt will be able to don and doff appropriate compression garments and/or devices using correct calibration  for ~ 20-30 mmHg  to control BLE lymphedema and to limit progression.  Baseline: Mod A Goal status: INITIAL  4.  Pt will achieve at least a 10% volume reductions bilaterally below the knees to return limb to more typical size and shape,  to limit infection risk and LE progression, to decrease pain, to improve function, and to improve body image and QOL. Baseline: Max A Goal status: INITIAL  5.  During Intensive phase CDT Pt will achieve at least 85% compliance with all lymphedema self-care home program components, including  daily skin care, multilayer , gradient compression wraps with daily changes, daily simple self MLD and daily lymphatic pumping therex to achieve optimal clinical outcome and to habituate self care regime for optimal LE self-management over time. Baseline: Max A Goal status: INITIAL   PLAN:  PT FREQUENCY: 2x/week, and PRN  PT DURATION: 12 weeks  PLANNED INTERVENTIONS: Complete Decongestive Therapy (CDT) Therapeutic exercises, Therapeutic activity, Patient/Family education, Self Care, DME instructions, Manual lymph drainage, Compression bandaging, and Manual therapy; fit with appropriate compression garments that control swelling and that Pt can don and doff          with extra time and/ or assistive devices;  consider trial with advanced sequential pneumatic compression device (Flexitouch) trial which mimics proximal to distal MLD for optimal lymphatic return.  PLAN FOR NEXT SESSION: Initial comparative limb volumetrics, commence wrapping RLE below the knee, Pt and family edu for LE self-care  Loel Dubonnet, MS, OTR/L, CLT-LANA 08/05/22 3:17 PM

## 2022-07-31 ENCOUNTER — Ambulatory Visit: Payer: Medicare Other | Admitting: Occupational Therapy

## 2022-07-31 DIAGNOSIS — I890 Lymphedema, not elsewhere classified: Secondary | ICD-10-CM | POA: Diagnosis not present

## 2022-08-05 ENCOUNTER — Encounter: Payer: Self-pay | Admitting: Occupational Therapy

## 2022-08-05 ENCOUNTER — Ambulatory Visit: Payer: Medicare Other | Admitting: Occupational Therapy

## 2022-08-05 DIAGNOSIS — I890 Lymphedema, not elsewhere classified: Secondary | ICD-10-CM | POA: Diagnosis not present

## 2022-08-05 NOTE — Therapy (Signed)
OUTPATIENT OCCUPATIONAL THERAPY LOWER EXTREMITY LYMPHEDEMA EVALUATION  Patient Name: Latoya Sloan MRN: [id number] DOB:[birth year], 89 y.o., female Today's Date: 08/05/2022  END OF SESSION:  OT End of Session - 08/05/22 1533     Visit Number 2    Number of Visits 36    Date for OT Re-Evaluation 10/27/22    OT Start Time 0800    OT Stop Time 0915    OT Time Calculation (min) 75 min    Activity Tolerance Patient tolerated treatment well;No increased pain    Behavior During Therapy WFL for tasks assessed/performed                    Past Medical History:  Diagnosis Date   Hypertension    Lymphedema of both lower extremities    Thyroid disease    History reviewed. No pertinent surgical history. Patient Active Problem List   Diagnosis Date Noted   Bradycardia 01/16/2021   Chronic diastolic CHF (congestive heart failure), NYHA class 2 ([place]) 04/06/2020   Leg numbness 03/20/2020   Leg swelling 03/20/2020   Lymphedema of both lower extremities 03/20/2020   Anosmia 03/06/2020   Bilateral carotid artery stenosis 03/06/2020   Chronic cough 03/06/2020   Dyspnea on exertion 03/06/2020   GERD (gastroesophageal reflux disease) 03/06/2020   Hyperlipidemia 03/06/2020   Lymphedema 03/06/2020   Migraines 03/06/2020   Osteoarthritis of both knees 03/06/2020   Idiopathic peripheral neuropathy 12/29/2013   Localized, primary osteoarthritis 12/28/2013   Osteoarthritis of knee 12/28/2013   Essential hypertension 10/23/2010   Family history of stroke 10/22/2010    PCP: Gladstone Lighter, MD  REFERRING PROVIDER: Eulogio Ditch, NP  REFERRING DIAG: I89.0  THERAPY DIAG:  Lymphedema, not elsewhere classified  ONSET DATE: >10 yrs  SUBJECTIVE:                                                                                                                                                                                           SUBJECTIVE STATEMENT: Latoya Sloan returns to  Occupational Therapy today to commence CDT to RLE. Pt is accompanied by her daughter, Latoya Sloan. Pt denies changes in medical status or pain level since last seen for her initial evaluation.   PERTINENT HISTORY: CHF, chronic , progressive lymphedema BLE, thyroid fdosease, Bradycardia, B knee OA, Periferal neuropathy, B carotid stenosis  PAIN:  Are you having pain? Yes: NPRS scale: 5/10 Pain location: R knee Pain description: sore, tender, heavy, tight, achy Aggravating factors: weight bearing Relieving factors: sitting, elevation, compression  PRECAUTIONS: Other: THYROID, B CAROTID ARTERY STENOSIS (NO strokes to neck!) , CHF, BRADYCARDIA  FALLS:  Has patient fallen  since last visit? No  HAND DOMINANCE: right   PRIOR LEVEL OF FUNCTION: Independent with basic ADLs, Independent with household mobility with device, Independent with community mobility with device, and Needs assistance with homemaking  PATIENT GOALS: "Because of the swelling, and I want to walk more. [name] gotten lazy about my walking.When I go to the vein doctor they wrapped my legs a couple of times, but I don't see them getting better."   OBJECTIVE:  OBSERVATIONS / OTHER ASSESSMENTS:  HX WOUNDS: denies  Hx non-pressure related INFECTIONS: denied  BLE COMPARATIVE LIMB VOLUMETRICS  LANDMARK  INITIAL RIGHT  07/31/22  R LEG (A-D) 7570.2 ml  R THIGH (E-G)   R FULL LIMB (A-G)   Limb Volume differential (LVD)    Volume change since initial %  Volume change overall   (Blank rows = not tested)  LANDMARK INITIAL LEFT  07/31/22  R LEG (A-D) 7224.4 ml  R THIGH (E-G) ml  R FULL LIMB (A-G) ml  Limb Volume differential (LVD)  4.6%, L leg  > R leg  Volume change since initial %  Volume change overall %  (Blank rows = not tested)   LYMPHEDEMA LIFE IMPACT SCALE (LLIS): 41.18%  (The extent to which LE-related problems affected your life in the past week.)  FOTO Outcome Measure: 58%   TODAY'S TREATMENT:                                                                                                                                          DATE:   PATIENT EDUCATION:  Continued Pt/ CG edu for lymphedema self care home program throughout session. Topics include outcome of comparative limb volumetrics- starting limb volume differentials (LVDs), technology and gradient techniques used for short stretch, multilayer compression wrapping, simple self-MLD, therapeutic lymphatic pumping exercises, skin/nail care, LE precautions,. compression garment recommendations and specifications, wear and care schedule and compression garment donning / doffing w assistive devices. Discussed progress towards all OT goals since commencing CDT. All questions answered to the Pt's satisfaction. Good return. Pt and CG verbalize understanding that she will need daily assistance throughout Intensive Phase Complete Decongestive Therapy (CDT) with compression wraps as she is unable to reach her distal legs and feet to reapply them. Pt and CG understand that without diligent daily compression    wraps changes and skin care prognosis for volume reduction is poor.   Person educated: Patient, adult daughter Latoya Sloan  Education method: Explanation, Demonstration, Handouts, and needs additional edu Education comprehension: verbalized understanding, returned demonstration, and needs further education  LYMPHEDEMA SELF-CARE HOME PROGRAM 1.Therapeutic lymphatic pumping therex- 2 sets of 10 reps, hold 5 seconds; elements in order. 2 x daily PRN 2.  Full time, daily compression: Intensive Phase CDT: multilayer short stretch bandages from toes to popliteal using gradient techniques         Self-management Phase CDT: Attempt to use existing  OTS, light duty adjustable leggings. Consider HOS devices  to limit          fibrosis (Jobst Relax ccl 2) 3. Daily skin care to sustain optimal hydration. Wash bites, scratches, blisters, etc with mild soap and water ,  apply antibacterial first aide cream and a band aide. 4. Simple self-Manual lymphatic drainage (MLD) PRN.:   ASSESSMENT:  CLINICAL IMPRESSION: Initial BLE comparative limb volumetrics reveal L leg is greater in volume than the R leg by 4.6% below the knee, which can be considered WNL; however what volumetrics do not capture is fact that both legs are actually equally swollen. Swelling is symmetrical, vs asymmetrical, which again is a common sign in lipedema induced lipo-lymphedema. Visit was dedicated to Pt/family edu for purpose and interpretation of initial volumetric measures and teaching Pt and her daughter intro level compression wrapping using short stretch bandages and gradient techniques. After initial teaching Pt and daughter are Max A to apply wraps. We'll continue to work on this training daily until mastered. Daughter agrees to wraps Pt daily as instructed throughout Intensive Phase CDT. Cont as per POC,Productive session.  OBJECTIVE IMPAIRMENTS: decreased knowledge of condition, decreased knowledge of use of DME, decreased mobility, difficulty walking, decreased ROM, increased edema, obesity, and pain.   ACTIVITY LIMITATIONS: decreased standing and walking tolerance > 30 minutes,  carrying, lifting, bending, extended dependent sitting, squatting, stairs, transfers, lower body bathing, LB grooming, LB dressing, productive, leisure and social participation requiring walking, standing and/ or dependent sitting >30 minutes ( attending social and community activities)  PERSONAL FACTORS: Age, time since onset, and 3+ co morbidities: CHF, OA, HTN are also affecting patient's functional outcome.   REHAB POTENTIAL: Good- with daily CG assistance with compression wraps during Intensive CDT. Poor without assistance with multilayer wraps  EVALUATION COMPLEXITY: moderate   GOALS: Goals reviewed with patient? Yes  SHORT TERM GOALS: Target date: 4th OT  Rx visit   Pt will demonstrate  understanding of lymphedema precautions and prevention strategies with modified independence using a printed reference to identify at least 5 precautions and discussing how s/he may implement them into daily life to reduce risk of progression with modified assistance. Baseline:Max A Goal status: INITIAL  2.   With Max caregiver assistance Pt will be able to apply multilayer, knee length, compression wraps using gradient techniques to decrease limb volume, to limit infection risk, and to limit lymphedema progression.  Baseline:Max A Goal status: INITIAL  LONG TERM GOALS: Target date: 10/27/22  Given this patient's Intake score of 58 /100% on the functional outcomes FOTO tool, patient will experience an increase in function of 3 points to improve basic and instrumental ADLs performance, including lymphedema self-care. (TBA at first OT Rx visit) Baseline: Dependent Goal status: INITIAL  2.  Given this patient's Intake score of 41.18% on the Lymphedema Life Impact Scale (LLIS), patient will experience a reduction of at least 5% in her perceived level of functional impairment resulting from lymphedema to improve functional performance and quality of life (QOL). Baseline: Dependent Goal status: INITIAL  3.  With modified assistance (extra time) Pt will be able to don and doff appropriate compression garments and/or devices using correct calibration  for ~ 20-30 mmHg  to control BLE lymphedema and to limit progression.  Baseline: Mod A Goal status: INITIAL  4.  Pt will achieve at least a 10% volume reductions bilaterally below the knees to return limb to more typical size and shape, to limit infection risk and LE progression,  to decrease pain, to improve function, and to improve body image and QOL. Baseline: Max A Goal status: INITIAL  5.  During Intensive phase CDT Pt will achieve at least 85% compliance with all lymphedema self-care home program components, including  daily skin care, multilayer  , gradient compression wraps with daily changes, daily simple self MLD and daily lymphatic pumping therex to achieve optimal clinical outcome and to habituate self care regime for optimal LE self-management over time. Baseline: Max A Goal status: INITIAL   PLAN:  PT FREQUENCY: 2x/week, and PRN  PT DURATION: 12 weeks  PLANNED INTERVENTIONS: Complete Decongestive Therapy (CDT) Therapeutic exercises, Therapeutic activity, Patient/Family education, Self Care, DME instructions, Manual lymph drainage, Compression bandaging, and Manual therapy; fit with appropriate compression garments that control swelling and that Pt can don and doff          with extra time and/ or assistive devices; consider trial with advanced sequential pneumatic compression device (Flexitouch) trial which mimics proximal to distal MLD for optimal lymphatic return.  Multilayer compression bandaging below the knee to single leg at a time to limit falls risk. Utilizing one each 8, 10 and 12 cm wide x 5 meters long, short stretch   bandages applied in staggered gradient fashion over single layer of .04 cm thick Rosidal foam applied circumferentially  over cotton stockinett from toes to popliteal fossa/.  PLAN FOR NEXT SESSION: Initial comparative limb volumetrics, commence wrapping RLE below the knee, Pt and family edu for LE self-care  Loel Dubonnet, MS, OTR/L, CLT-LANA 08/05/22 3:35 PM

## 2022-08-06 NOTE — Therapy (Signed)
OUTPATIENT OCCUPATIONAL THERAPY TREATMENT NOTE LOWER EXTREMITY LYMPHEDEMA  Patient Name: Aditri Louischarles MRN: [id number] DOB:Jul 20, 1931, 89 y.o., female Today's Date: 08/06/2022  END OF SESSION:  OT End of Session - 08/05/22 1542     Visit Number 3    Number of Visits 36    Date for OT Re-Evaluation 10/27/22    OT Start Time 1008    OT Stop Time 1108    OT Time Calculation (min) 60 min    Activity Tolerance Patient tolerated treatment well;No increased pain    Behavior During Therapy WFL for tasks assessed/performed                    Past Medical History:  Diagnosis Date   Hypertension    Lymphedema of both lower extremities    Thyroid disease    No past surgical history on file. Patient Active Problem List   Diagnosis Date Noted   Bradycardia 01/16/2021   Chronic diastolic CHF (congestive heart failure), NYHA class 2 ([place]) 04/06/2020   Leg numbness 03/20/2020   Leg swelling 03/20/2020   Lymphedema of both lower extremities 03/20/2020   Anosmia 03/06/2020   Bilateral carotid artery stenosis 03/06/2020   Chronic cough 03/06/2020   Dyspnea on exertion 03/06/2020   GERD (gastroesophageal reflux disease) 03/06/2020   Hyperlipidemia 03/06/2020   Lymphedema 03/06/2020   Migraines 03/06/2020   Osteoarthritis of both knees 03/06/2020   Idiopathic peripheral neuropathy 12/29/2013   Localized, primary osteoarthritis 12/28/2013   Osteoarthritis of knee 12/28/2013   Essential hypertension 10/23/2010   Family history of stroke 10/22/2010    PCP: Gladstone Lighter, MD  REFERRING PROVIDER: Eulogio Ditch, NP  REFERRING DIAG: I89.0  THERAPY DIAG:  Lymphedema, not elsewhere classified  ONSET DATE: >10 yrs  SUBJECTIVE:                                                                                                                                                                                           SUBJECTIVE STATEMENT: Clydene Burack returns to Occupational Therapy  today to commence CDT to RLE. Pt is unaccompanied. Pt denies changes in 5/10 LE related pain since last visit. Pt reports she removed compression wraps her daughter applied after going to bed because they were too tight. Pt has no new complaints.   PERTINENT HISTORY: CHF, chronic , progressive lymphedema BLE, thyroid disease, Bradycardia, B knee OA, Periferal neuropathy, B carotid stenosis  PAIN:  Are you having pain? Yes: NPRS scale: 5/10 Pain location: R knee Pain description: sore, tender, heavy, tight, achy Aggravating factors: weight bearing Relieving factors: sitting, elevation, compression  PRECAUTIONS: Other: THYROID, B CAROTID  ARTERY STENOSIS (NO strokes to lateral neck!) , CHF, BRADYCARDIA  FALLS:  Has patient fallen since last visit? No  HAND DOMINANCE: right   PRIOR LEVEL OF FUNCTION: Independent with basic ADLs, Independent with household mobility with device, Independent with community mobility with device, and Needs assistance with homemaking  PATIENT GOALS: "Because of the swelling, and I want to walk more. [name] gotten lazy about my walking.When I go to the vein doctor they wrapped my legs a couple of times, but I don't see them getting better."   OBJECTIVE:  OBSERVATIONS / OTHER ASSESSMENTS:  HX WOUNDS: denies  Hx non-pressure related INFECTIONS: denied  BLE COMPARATIVE LIMB VOLUMETRICS  LANDMARK  INITIAL RIGHT  07/31/22  R LEG (A-D) 7570.2 ml  R THIGH (E-G)   R FULL LIMB (A-G)   Limb Volume differential (LVD)    Volume change since initial %  Volume change overall   (Blank rows = not tested)  LANDMARK INITIAL LEFT  07/31/22  R LEG (A-D) 7224.4 ml  R THIGH (E-G) ml  R FULL LIMB (A-G) ml  Limb Volume differential (LVD)  4.6%, L leg  > R leg  Volume change since initial %  Volume change overall %  (Blank rows = not tested)   LYMPHEDEMA LIFE IMPACT SCALE (LLIS): 41.18%  (The extent to which LE-related problems affected your life in the past  week.)  FOTO Outcome Measure: 58%   TODAY'S TREATMENT:                                                                                                                                         DATE:   PATIENT EDUCATION:  Continued Pt/ CG edu for lymphedema self care home program throughout session. Topics include outcome of comparative limb volumetrics- starting limb volume differentials (LVDs), technology and gradient techniques used for short stretch, multilayer compression wrapping, simple self-MLD, therapeutic lymphatic pumping exercises, skin/nail care, LE precautions,. compression garment recommendations and specifications, wear and care schedule and compression garment donning / doffing w assistive devices. Discussed progress towards all OT goals since commencing CDT. All questions answered to the Pt's satisfaction. Good return. Pt and CG verbalize understanding that she will need daily assistance throughout Intensive Phase Complete Decongestive Therapy (CDT) with compression wraps as she is unable to reach her distal legs and feet to reapply them. Pt and CG understand that without diligent daily compression    wraps changes and skin care prognosis for volume reduction is poor.   Person educated: Patient, adult daughter Samuella Cota  Education method: Explanation, Demonstration, Handouts, and needs additional edu Education comprehension: verbalized understanding, returned demonstration, and needs further education  LYMPHEDEMA SELF-CARE HOME PROGRAM 1.Therapeutic lymphatic pumping therex- 2 sets of 10 reps, hold 5 seconds; elements in order. 2 x daily PRN 2.  Full time, daily compression: Intensive Phase CDT: multilayer short stretch bandages from toes to popliteal using gradient  techniques  Self-management Phase CDT: Attempt to use existing OTS, light duty adjustable leggings. Consider HOS devices  to limit fibrosis (Jobst Relax ccl 2) 3. Daily skin care to sustain optimal hydration. Wash  bites, scratches, blisters, etc with mild soap and water , apply antibacterial first aide cream and a band aide. 4. Simple self-Manual lymphatic drainage (MLD) PRN.:   ASSESSMENT:  CLINICAL IMPRESSION: No change observed in limb volume or tissue density since last visit. Introduced MLD with edu for purpose and structure and function. Commenced MLD to RLE/RLQ utilizing short neck sequence utilizing strokes to clavicles only. (In keeping with LE precautions NO STROKES TO LATERAL NECK BILATERALLY), diaphragmatic breathing to activate deep abdominal lymphatics and thoracic duct, functional inguinal LNs, and J strokes to thigh, knee, leg, ankle and foot from proximal to distal, before completing retrograde sweep to finish w/ short neck sequence.  Excellent tolerance. Reapplied multilayered compression wraps as established using gradient techniques. Cont as per POC.  OBJECTIVE IMPAIRMENTS: decreased knowledge of condition, decreased knowledge of use of DME, decreased mobility, difficulty walking, decreased ROM, increased edema, obesity, and pain.   ACTIVITY LIMITATIONS: decreased standing and walking tolerance > 30 minutes,  carrying, lifting, bending, extended dependent sitting, squatting, stairs, transfers, lower body bathing, LB grooming, LB dressing, productive, leisure and social participation requiring walking, standing and/ or dependent sitting >30 minutes ( attending social and community activities)  PERSONAL FACTORS: Age, time since onset, and 3+ co morbidities: CHF, OA, HTN are also affecting patient's functional outcome.   REHAB POTENTIAL: Good- with daily CG assistance with compression wraps during Intensive CDT. Poor without assistance with multilayer wraps  EVALUATION COMPLEXITY: moderate   GOALS: Goals reviewed with patient? Yes  SHORT TERM GOALS: Target date: 4th OT  Rx visit   Pt will demonstrate understanding of lymphedema precautions and prevention strategies with modified  independence using a printed reference to identify at least 5 precautions and discussing how s/he may implement them into daily life to reduce risk of progression with modified assistance. Baseline:Max A Goal status: INITIAL  2.   With Max caregiver assistance Pt will be able to apply multilayer, knee length, compression wraps using gradient techniques to decrease limb volume, to limit infection risk, and to limit lymphedema progression.  Baseline:Max A Goal status: INITIAL  LONG TERM GOALS: Target date: 10/27/22  Given this patient's Intake score of 58 /100% on the functional outcomes FOTO tool, patient will experience an increase in function of 3 points to improve basic and instrumental ADLs performance, including lymphedema self-care. (TBA at first OT Rx visit) Baseline: Dependent Goal status: INITIAL  2.  Given this patient's Intake score of 41.18% on the Lymphedema Life Impact Scale (LLIS), patient will experience a reduction of at least 5% in her perceived level of functional impairment resulting from lymphedema to improve functional performance and quality of life (QOL). Baseline: Dependent Goal status: INITIAL  3.  With modified assistance (extra time) Pt will be able to don and doff appropriate compression garments and/or devices using correct calibration  for ~ 20-30 mmHg  to control BLE lymphedema and to limit progression.  Baseline: Mod A Goal status: INITIAL  4.  Pt will achieve at least a 10% volume reductions bilaterally below the knees to return limb to more typical size and shape, to limit infection risk and LE progression, to decrease pain, to improve function, and to improve body image and QOL. Baseline: Max A Goal status: INITIAL  5.  During Intensive phase CDT Pt  will achieve at least 85% compliance with all lymphedema self-care home program components, including  daily skin care, multilayer , gradient compression wraps with daily changes, daily simple self MLD and daily  lymphatic pumping therex to achieve optimal clinical outcome and to habituate self care regime for optimal LE self-management over time. Baseline: Max A Goal status: INITIAL   PLAN:  PT FREQUENCY: 2x/week, and PRN  PT DURATION: 12 weeks  PLANNED INTERVENTIONS: Complete Decongestive Therapy (CDT) Therapeutic exercises, Therapeutic activity, Patient/Family education, Self Care, DME instructions, Manual lymph drainage, Compression bandaging, and Manual therapy; fit with appropriate compression garments that control swelling and that Pt can don and doff          with extra time and/ or assistive devices; consider trial with advanced sequential pneumatic compression device (Flexitouch) trial which mimics proximal to distal MLD for optimal lymphatic return.  Multilayer compression bandaging below the knee to single leg at a time to limit falls risk. Utilizing one each 8, 10 and 12 cm wide x 5 meters long, short stretch   bandages applied in staggered gradient fashion over single layer of .04 cm thick Rosidal foam applied circumferentially  over cotton stockinett from toes to popliteal fossa/.  PLAN FOR NEXT SESSION: Initial comparative limb volumetrics, commence wrapping RLE below the knee, Pt and family edu for LE self-care  Andrey Spearman, MS, OTR/L, CLT-LANA 08/06/22 3:43 PM

## 2022-08-07 ENCOUNTER — Ambulatory Visit: Payer: Medicare Other | Attending: Nurse Practitioner | Admitting: Occupational Therapy

## 2022-08-07 ENCOUNTER — Encounter: Payer: Self-pay | Admitting: Occupational Therapy

## 2022-08-07 DIAGNOSIS — I890 Lymphedema, not elsewhere classified: Secondary | ICD-10-CM | POA: Diagnosis present

## 2022-08-07 NOTE — Therapy (Signed)
OUTPATIENT OCCUPATIONAL THERAPY TREATMENT NOTE LOWER EXTREMITY LYMPHEDEMA  Patient Name: Providence Stivers MRN: [id number] DOB:July 19, 1931, 89 y.o., female Today's Date: 08/07/2022  END OF SESSION:  OT End of Session - 08/07/22 0810     Visit Number 4    Number of Visits 36    Date for OT Re-Evaluation 10/27/22    OT Start Time 0802    OT Stop Time 0902    OT Time Calculation (min) 60 min    Activity Tolerance Patient tolerated treatment well;No increased pain    Behavior During Therapy WFL for tasks assessed/performed                    Past Medical History:  Diagnosis Date   Hypertension    Lymphedema of both lower extremities    Thyroid disease    History reviewed. No pertinent surgical history. Patient Active Problem List   Diagnosis Date Noted   Bradycardia 01/16/2021   Chronic diastolic CHF (congestive heart failure), NYHA class 2 (HCC) 04/06/2020   Leg numbness 03/20/2020   Leg swelling 03/20/2020   Lymphedema of both lower extremities 03/20/2020   Anosmia 03/06/2020   Bilateral carotid artery stenosis 03/06/2020   Chronic cough 03/06/2020   Dyspnea on exertion 03/06/2020   GERD (gastroesophageal reflux disease) 03/06/2020   Hyperlipidemia 03/06/2020   Lymphedema 03/06/2020   Migraines 03/06/2020   Osteoarthritis of both knees 03/06/2020   Idiopathic peripheral neuropathy 12/29/2013   Localized, primary osteoarthritis 12/28/2013   Osteoarthritis of knee 12/28/2013   Essential hypertension 10/23/2010   Family history of stroke 10/22/2010    PCP: Enid Baas, MD  REFERRING PROVIDER: Sheppard Plumber, NP  REFERRING DIAG: I89.0  THERAPY DIAG:  Lymphedema, not elsewhere classified  ONSET DATE: >10 yrs  SUBJECTIVE:                                                                                                                                                                                           SUBJECTIVE STATEMENT: Jaycelyn Orrison returns to  Occupational Therapy today to commence CDT to RLE. Pt is unaccompanied. Pt denies LE related pain since last visit. Pt reports she tolerated bandages during visit interval without pain or other difficulty. She states her daughter has applied wraps without tightbness since she was here last. Pt has no new complaints.   PERTINENT HISTORY: CHF, chronic , progressive lymphedema BLE, thyroid disease, Bradycardia, B knee OA, Periferal neuropathy, B carotid stenosis  PAIN:  Are you having pain? Yes: NPRS scale: 0/10 Pain location: R knee Pain description: sore, tender, heavy, tight, achy Aggravating factors: weight bearing Relieving factors: sitting, elevation, compression  PRECAUTIONS: Other: THYROID, B CAROTID ARTERY STENOSIS (NO strokes to lateral neck!) , CHF, BRADYCARDIA  FALLS:  Has patient fallen since last visit? No  HAND DOMINANCE: right   PRIOR LEVEL OF FUNCTION: Independent with basic ADLs, Independent with household mobility with device, Independent with community mobility with device, and Needs assistance with homemaking  PATIENT GOALS: "Because of the swelling, and I want to walk more. [name] gotten lazy about my walking.When I go to the vein doctor they wrapped my legs a couple of times, but I don't see them getting better."   OBJECTIVE:  OBSERVATIONS / OTHER ASSESSMENTS:  HX WOUNDS: denies  Hx non-pressure related INFECTIONS: denied  BLE COMPARATIVE LIMB VOLUMETRICS  LANDMARK  INITIAL RIGHT  07/31/22  R LEG (A-D) 7570.2 ml  R THIGH (E-G)   R FULL LIMB (A-G)   Limb Volume differential (LVD)    Volume change since initial %  Volume change overall   (Blank rows = not tested)  LANDMARK INITIAL LEFT  07/31/22  R LEG (A-D) 7224.4 ml  R THIGH (E-G) ml  R FULL LIMB (A-G) ml  Limb Volume differential (LVD)  4.6%, L leg  > R leg  Volume change since initial %  Volume change overall %  (Blank rows = not tested)   LYMPHEDEMA LIFE IMPACT SCALE (LLIS): 41.18%  (The extent  to which LE-related problems affected your life in the past week.)  FOTO Outcome Measure: 58%   TODAY'S TREATMENT:                                                                                                                                         DATE:   PATIENT EDUCATION:  Continued Pt/ CG edu for lymphedema self care home program throughout session. Topics include outcome of comparative limb volumetrics- starting limb volume differentials (LVDs), technology and gradient techniques used for short stretch, multilayer compression wrapping, simple self-MLD, therapeutic lymphatic pumping exercises, skin/nail care, LE precautions,. compression garment recommendations and specifications, wear and care schedule and compression garment donning / doffing w assistive devices. Discussed progress towards all OT goals since commencing CDT. All questions answered to the Pt's satisfaction. Good return. Pt and CG verbalize understanding that she will need daily assistance throughout Intensive Phase Complete Decongestive Therapy (CDT) with compression wraps as she is unable to reach her distal legs and feet to reapply them. Pt and CG understand that without diligent daily compression    wraps changes and skin care prognosis for volume reduction is poor.   Person educated: Patient, adult daughter [name]  Education method: Explanation, Demonstration, Handouts, and needs additional edu Education comprehension: verbalized understanding, returned demonstration, and needs further education  LYMPHEDEMA SELF-CARE HOME PROGRAM 1.Therapeutic lymphatic pumping therex- 2 sets of 10 reps, hold 5 seconds; elements in order. 2 x daily PRN 2.  Full time, daily compression: Intensive Phase CDT: multilayer short stretch bandages from  toes to popliteal using gradient techniques  Self-management Phase CDT: Attempt to use existing OTS, light duty adjustable leggings. Consider HOS devices  to limit fibrosis (Jobst Relax  ccl 2) 3. Daily skin care to sustain optimal hydration. Wash bites, scratches, blisters, etc with mild soap and water , apply antibacterial first aide cream and a band aide. 4. Simple self-Manual lymphatic drainage (MLD) PRN.:   ASSESSMENT:  CLINICAL IMPRESSION: R leg presenting with slight reduction in swelling. No visible or palpable change to tissue density/ integrity. Cont Pt edu for simple self MLD, including Introduced MLD with  purpose, structure and function and J stroke technique.Aneta Mins toleranc for MLD as established. Reapplied multilayered compression wraps as established using gradient techniques. Cont as per POC.  OBJECTIVE IMPAIRMENTS: decreased knowledge of condition, decreased knowledge of use of DME, decreased mobility, difficulty walking, decreased ROM, increased edema, obesity, and pain.   ACTIVITY LIMITATIONS: decreased standing and walking tolerance > 30 minutes,  carrying, lifting, bending, extended dependent sitting, squatting, stairs, transfers, lower body bathing, LB grooming, LB dressing, productive, leisure and social participation requiring walking, standing and/ or dependent sitting >30 minutes ( attending social and community activities)  PERSONAL FACTORS: Age, time since onset, and 3+ co morbidities: CHF, OA, HTN are also affecting patient's functional outcome.   REHAB POTENTIAL: Good- with daily CG assistance with compression wraps during Intensive CDT. Poor without assistance with multilayer wraps  EVALUATION COMPLEXITY: moderate   GOALS: Goals reviewed with patient? Yes  SHORT TERM GOALS: Target date: 4th OT  Rx visit   Pt will demonstrate understanding of lymphedema precautions and prevention strategies with modified independence using a printed reference to identify at least 5 precautions and discussing how s/he may implement them into daily life to reduce risk of progression with modified assistance. Baseline:Max A Goal status: INITIAL  2.   With  Max caregiver assistance Pt will be able to apply multilayer, knee length, compression wraps using gradient techniques to decrease limb volume, to limit infection risk, and to limit lymphedema progression.  Baseline:Max A Goal status: 08/07/22 GOAL MET  LONG TERM GOALS: Target date: 10/27/22  Given this patient's Intake score of 58 /100% on the functional outcomes FOTO tool, patient will experience an increase in function of 3 points to improve basic and instrumental ADLs performance, including lymphedema self-care. (TBA at first OT Rx visit) Baseline: Dependent Goal status: INITIAL  2.  Given this patient's Intake score of 41.18% on the Lymphedema Life Impact Scale (LLIS), patient will experience a reduction of at least 5% in her perceived level of functional impairment resulting from lymphedema to improve functional performance and quality of life (QOL). Baseline: Dependent Goal status: INITIAL  3.  With modified assistance (extra time) Pt will be able to don and doff appropriate compression garments and/or devices using correct calibration  for ~ 20-30 mmHg  to control BLE lymphedema and to limit progression.  Baseline: Mod A Goal status: INITIAL  4.  Pt will achieve at least a 10% volume reductions bilaterally below the knees to return limb to more typical size and shape, to limit infection risk and LE progression, to decrease pain, to improve function, and to improve body image and QOL. Baseline: Max A Goal status: INITIAL  5.  During Intensive phase CDT Pt will achieve at least 85% compliance with all lymphedema self-care home program components, including  daily skin care, multilayer , gradient compression wraps with daily changes, daily simple self MLD and daily lymphatic pumping therex to achieve  optimal clinical outcome and to habituate self care regime for optimal LE self-management over time. Baseline: Max A Goal status: INITIAL   PLAN:  PT FREQUENCY: 2x/week, and PRN  PT  DURATION: 12 weeks  PLANNED INTERVENTIONS: Complete Decongestive Therapy (CDT) Therapeutic exercises, Therapeutic activity, Patient/Family education, Self Care, DME instructions, Manual lymph drainage, Compression bandaging, and Manual therapy; fit with appropriate compression garments that control swelling and that Pt can don and doff          with extra time and/ or assistive devices; consider trial with advanced sequential pneumatic compression device (Flexitouch) trial which mimics proximal to distal MLD for optimal lymphatic return.  Multilayer compression bandaging below the knee to single leg at a time to limit falls risk. Utilizing one each 8, 10 and 12 cm wide x 5 meters long, short stretch   bandages applied in staggered gradient fashion over single layer of .04 cm thick Rosidal foam applied circumferentially  over cotton stockinett from toes to popliteal fossa.  MLD to RLE/RLQ utilizing modified short neck sequence utilizing strokes to clavicles only. ( NO STROKES TO LATERAL NECK BILATERALLY)  diaphragmatic breathing to activate deep abdominal lymphatics and thoracic duct, functional inguinal LNs, and proximal to distal J strokes to thigh, knee, leg, ankle and foot, then retrograde sweep to finish w/ modified short neck sequence.  PLAN FOR NEXT SESSION:  MLD to RLE w simultaneous skin care Knee length multilayer compression wraps Pt/ family edu  Andrey Spearman, MS, OTR/L, CLT-LANA 08/07/22 8:56 AM

## 2022-08-12 ENCOUNTER — Ambulatory Visit: Payer: Medicare Other | Admitting: Occupational Therapy

## 2022-08-12 DIAGNOSIS — I890 Lymphedema, not elsewhere classified: Secondary | ICD-10-CM | POA: Diagnosis not present

## 2022-08-12 NOTE — Therapy (Signed)
OUTPATIENT OCCUPATIONAL THERAPY TREATMENT NOTE LOWER EXTREMITY LYMPHEDEMA  Patient Name: Latoya Sloan MRN: [id number] DOB:[birth year], 89 y.o., female Today's Date: 08/12/2022  END OF SESSION:  OT End of Session - 08/12/22 1338     Visit Number 5    Number of Visits 36    Date for OT Re-Evaluation 10/27/22    OT Start Time 1004    OT Stop Time 1104    OT Time Calculation (min) 60 min    Activity Tolerance Patient tolerated treatment well;No increased pain    Behavior During Therapy WFL for tasks assessed/performed                    Past Medical History:  Diagnosis Date   Hypertension    Lymphedema of both lower extremities    Thyroid disease    No past surgical history on file. Patient Active Problem List   Diagnosis Date Noted   Bradycardia 01/16/2021   Chronic diastolic CHF (congestive heart failure), NYHA class 2 ([place]) 04/06/2020   Leg numbness 03/20/2020   Leg swelling 03/20/2020   Lymphedema of both lower extremities 03/20/2020   Anosmia 03/06/2020   Bilateral carotid artery stenosis 03/06/2020   Chronic cough 03/06/2020   Dyspnea on exertion 03/06/2020   GERD (gastroesophageal reflux disease) 03/06/2020   Hyperlipidemia 03/06/2020   Lymphedema 03/06/2020   Migraines 03/06/2020   Osteoarthritis of both knees 03/06/2020   Idiopathic peripheral neuropathy 12/29/2013   Localized, primary osteoarthritis 12/28/2013   Osteoarthritis of knee 12/28/2013   Essential hypertension 10/23/2010   Family history of stroke 10/22/2010    PCP: Gladstone Lighter, MD  REFERRING PROVIDER: Eulogio Ditch, NP  REFERRING DIAG: I89.0  THERAPY DIAG:  Lymphedema, not elsewhere classified  ONSET DATE: >10 yrs  SUBJECTIVE:                                                                                                                                                                                           SUBJECTIVE STATEMENT: Latoya Sloan returns to Occupational Therapy   to address RLE lymphedema. Pt is unaccompanied. Pt reports she tried to wrap herself and ended up removing wraps   because they were so tight. She describes petechia and soreness to light touch after removing wraps that she applied. Pt states     adamantly that she doesn't think they were too tight. She states she has not wrapped since thew weekend.  PERTINENT HISTORY: CHF, chronic , progressive lymphedema BLE, thyroid disease, Bradycardia, B knee OA, Periferal neuropathy, B carotid stenosis  PAIN:  Are you having pain? Yes: NPRS scale: 0/10 Pain location: R knee Pain  description: sore, tender, heavy, tight, achy Aggravating factors: weight bearing Relieving factors: sitting, elevation, compression  PRECAUTIONS: Other: THYROID, B CAROTID ARTERY STENOSIS (NO strokes to lateral neck!) , CHF, BRADYCARDIA  FALLS:  Has patient fallen since last visit? No  HAND DOMINANCE: right   PRIOR LEVEL OF FUNCTION: Independent with basic ADLs, Independent with household mobility with device, Independent with community mobility with device, and Needs assistance with homemaking  PATIENT GOALS: "Because of the swelling, and I want to walk more. Latoya Sloan gotten lazy about my walking.When I go to the vein doctor they wrapped my legs a couple of times, but I don't see them getting better."   OBJECTIVE:  OBSERVATIONS / OTHER ASSESSMENTS:  HX WOUNDS: denies  Hx non-pressure related INFECTIONS: denied  BLE COMPARATIVE LIMB VOLUMETRICS  LANDMARK  INITIAL RIGHT  07/31/22  R LEG (A-D) 7570.2 ml  R THIGH (E-G)   R FULL LIMB (A-G)   Limb Volume differential (LVD)    Volume change since initial %  Volume change overall   (Blank rows = not tested)  LANDMARK INITIAL LEFT  07/31/22  R LEG (A-D) 7224.4 ml  R THIGH (E-G) ml  R FULL LIMB (A-G) ml  Limb Volume differential (LVD)  4.6%, L leg  > R leg  Volume change since initial %  Volume change overall %  (Blank rows = not tested)   LYMPHEDEMA LIFE IMPACT  SCALE (LLIS): 41.18%  (The extent to which LE-related problems affected your life in the past week.)  FOTO Outcome Measure: 58%   TODAY'S TREATMENT:                                                                                                                                         DATE:   PATIENT EDUCATION: Patient educated re signs and symptoms of over compression with Rosidal foam (long stretch) vs   short stretch bandages. EDU re petechia as symptom . Cont edu for multilayer compression wrapping during application  Person educated: Patient, adult daughter Latoya Sloan  Education method: Explanation, Demonstration, Handouts, and needs additional edu Education comprehension: verbalized understanding, returned demonstration, and needs further education  LYMPHEDEMA SELF-CARE HOME PROGRAM 1.Therapeutic lymphatic pumping therex- 2 sets of 10 reps, hold 5 seconds; elements in order. 2 x daily PRN 2.  Full time, daily compression: Intensive Phase CDT: multilayer short stretch bandages from toes to popliteal using gradient techniques  Self-management Phase CDT: Attempt to use existing OTS, light duty adjustable leggings. Consider HOS devices  to limit fibrosis (Jobst Relax ccl 2) 3. Daily skin care to sustain optimal hydration. Wash bites, scratches, blisters, etc with mild soap and water , apply antibacterial first aide cream and a band aide. 4. Simple self-Manual lymphatic drainage (MLD) PRN.:   ASSESSMENT:  CLINICAL IMPRESSION: R leg presenting with slight reduction in swelling noted again today. No visible or palpable change to tissue density/ integrity.  No signs of infection or skin injury or breakdown. No visible petechia.  Expect Pt most likely pulled Rosidal foam,am too tightly;uy when applying wraps, but there is no visible or palpable residual damages. Pt praised for attempting to apply compression wraps herself. Will continue to encourage her so as this is much more convenient  than waiting for a caregiver. Excellent tolerance for MLD as established. Reapplied multilayered compression wraps as established using gradient techniques. Cont as per POC.  OBJECTIVE IMPAIRMENTS: decreased knowledge of condition, decreased knowledge of use of DME, decreased mobility, difficulty walking, decreased ROM, increased edema, obesity, and pain.   ACTIVITY LIMITATIONS: decreased standing and walking tolerance > 30 minutes,  carrying, lifting, bending, extended dependent sitting, squatting, stairs, transfers, lower body bathing, LB grooming, LB dressing, productive, leisure and social participation requiring walking, standing and/ or dependent sitting >30 minutes ( attending social and community activities)  PERSONAL FACTORS: Age, time since onset, and 3+ co morbidities: CHF, OA, HTN are also affecting patient's functional outcome.   REHAB POTENTIAL: Good- with daily CG assistance with compression wraps during Intensive CDT. Poor without assistance with multilayer wraps  EVALUATION COMPLEXITY: moderate   GOALS: Goals reviewed with patient? Yes  SHORT TERM GOALS: Target date: 4th OT  Rx visit   Pt will demonstrate understanding of lymphedema precautions and prevention strategies with modified independence using a printed reference to identify at least 5 precautions and discussing how s/he may implement them into daily life to reduce risk of progression with modified assistance. Baseline:Max A Goal status: INITIAL  2.   With Max caregiver assistance Pt will be able to apply multilayer, knee length, compression wraps using gradient techniques to decrease limb volume, to limit infection risk, and to limit lymphedema progression.  Baseline:Max A Goal status: 08/07/22 GOAL MET  LONG TERM GOALS: Target date: 10/27/22  Given this patient's Intake score of 58 /100% on the functional outcomes FOTO tool, patient will experience an increase in function of 3 points to improve basic and  instrumental ADLs performance, including lymphedema self-care. (TBA at first OT Rx visit) Baseline: Dependent Goal status: INITIAL  2.  Given this patient's Intake score of 41.18% on the Lymphedema Life Impact Scale (LLIS), patient will experience a reduction of at least 5% in her perceived level of functional impairment resulting from lymphedema to improve functional performance and quality of life (QOL). Baseline: Dependent Goal status: INITIAL  3.  With modified assistance (extra time) Pt will be able to don and doff appropriate compression garments and/or devices using correct calibration  for ~ 20-30 mmHg  to control BLE lymphedema and to limit progression.  Baseline: Mod A Goal status: INITIAL  4.  Pt will achieve at least a 10% volume reductions bilaterally below the knees to return limb to more typical size and shape, to limit infection risk and LE progression, to decrease pain, to improve function, and to improve body image and QOL. Baseline: Max A Goal status: INITIAL  5.  During Intensive phase CDT Pt will achieve at least 85% compliance with all lymphedema self-care home program components, including  daily skin care, multilayer , gradient compression wraps with daily changes, daily simple self MLD and daily lymphatic pumping therex to achieve optimal clinical outcome and to habituate self care regime for optimal LE self-management over time. Baseline: Max A Goal status: INITIAL   PLAN:  PT FREQUENCY: 2x/week, and PRN  PT DURATION: 12 weeks  PLANNED INTERVENTIONS: Complete Decongestive Therapy (CDT) Therapeutic exercises, Therapeutic activity, Patient/Family  education, Self Care, DME instructions, Manual lymph drainage, Compression bandaging, and Manual therapy; fit with appropriate compression garments that control swelling and that Pt can don and doff          with extra time and/ or assistive devices; consider trial with advanced sequential pneumatic compression device  (Flexitouch) trial which mimics proximal to distal MLD for optimal lymphatic return.  Multilayer compression bandaging below the knee to single leg at a time to limit falls risk. Utilizing one each 8, 10 and 12 cm wide x 5 meters long, short stretch   bandages applied in staggered gradient fashion over single layer of .04 cm thick Rosidal foam applied circumferentially  over cotton stockinett from toes to popliteal fossa.  MLD to RLE/RLQ utilizing modified short neck sequence utilizing strokes to clavicles only. ( NO STROKES TO LATERAL NECK BILATERALLY)  diaphragmatic breathing to activate deep abdominal lymphatics and thoracic duct, functional inguinal LNs, and proximal to distal J strokes to thigh, knee, leg, ankle and foot, then retrograde sweep to finish w/ modified short neck sequence.  PLAN FOR NEXT SESSION:  MLD to RLE w simultaneous skin care Knee length multilayer compression wraps Pt/ family edu  Loel Dubonnet, MS, OTR/L, CLT-LANA 08/12/22 1:40 PM

## 2022-08-14 ENCOUNTER — Ambulatory Visit: Payer: Medicare Other | Admitting: Occupational Therapy

## 2022-08-14 DIAGNOSIS — I890 Lymphedema, not elsewhere classified: Secondary | ICD-10-CM

## 2022-08-14 NOTE — Therapy (Signed)
OUTPATIENT OCCUPATIONAL THERAPY TREATMENT NOTE LOWER EXTREMITY LYMPHEDEMA  Patient Name: Latoya Sloan MRN: [id number] DOB:1932/03/29, 89 y.o., female Today's Date: 08/15/2022  END OF SESSION:  OT End of Session - 08/14/22 0808     Visit Number 6    Number of Visits 36    Date for OT Re-Evaluation 10/27/22    OT Start Time 0800    OT Stop Time 0900    OT Time Calculation (min) 60 min    Activity Tolerance Patient tolerated treatment well;No increased pain    Behavior During Therapy WFL for tasks assessed/performed                    Past Medical History:  Diagnosis Date   Hypertension    Lymphedema of both lower extremities    Thyroid disease    No past surgical history on file. Patient Active Problem List   Diagnosis Date Noted   Bradycardia 01/16/2021   Chronic diastolic CHF (congestive heart failure), NYHA class 2 ([place]) 04/06/2020   Leg numbness 03/20/2020   Leg swelling 03/20/2020   Lymphedema of both lower extremities 03/20/2020   Anosmia 03/06/2020   Bilateral carotid artery stenosis 03/06/2020   Chronic cough 03/06/2020   Dyspnea on exertion 03/06/2020   GERD (gastroesophageal reflux disease) 03/06/2020   Hyperlipidemia 03/06/2020   Lymphedema 03/06/2020   Migraines 03/06/2020   Osteoarthritis of both knees 03/06/2020   Idiopathic peripheral neuropathy 12/29/2013   Localized, primary osteoarthritis 12/28/2013   Osteoarthritis of knee 12/28/2013   Essential hypertension 10/23/2010   Family history of stroke 10/22/2010    PCP: Gladstone Lighter, MD  REFERRING PROVIDER: Eulogio Ditch, NP  REFERRING DIAG: I89.0  THERAPY DIAG:  Lymphedema, not elsewhere classified  ONSET DATE: >10 yrs  SUBJECTIVE:                                                                                                                                                                                           SUBJECTIVE STATEMENT: Latoya Sloan presents for Occupational  Therapy for BLE lymphedema. Pt is unaccompanied. Pt reports 5/10 OA knee pain in R knee. She denies LE-related pain. Pt reports wrapping went well during visit interval. Pt is wrapping herself now , which exceeds her expectations. She tolerated wraps without pain.    PERTINENT HISTORY: CHF, chronic , progressive lymphedema BLE, thyroid disease, Bradycardia, B knee OA, Periferal neuropathy, B carotid stenosis  PAIN:  Are you having pain? Yes: NPRS scale: 5/10 Pain location: R knee Pain description: sore, tender, heavy, tight, achy Aggravating factors: weight bearing Relieving factors: sitting, elevation, compression  PRECAUTIONS: Other: THYROID, B CAROTID  ARTERY STENOSIS (NO strokes to lateral neck!) , CHF, BRADYCARDIA  FALLS:  Has patient fallen since last visit? No  HAND DOMINANCE: right   PRIOR LEVEL OF FUNCTION: Independent with basic ADLs, Independent with household mobility with device, Independent with community mobility with device, and Needs assistance with homemaking  PATIENT GOALS: "Because of the swelling, and I want to walk more. [name] gotten lazy about my walking.When I go to the vein doctor they wrapped my legs a couple of times, but I don't see them getting better."   OBJECTIVE:  OBSERVATIONS / OTHER ASSESSMENTS:  HX WOUNDS: denies  Hx non-pressure related INFECTIONS: denied  BLE COMPARATIVE LIMB VOLUMETRICS  LANDMARK  INITIAL RIGHT  07/31/22  R LEG (A-D) 7570.2 ml  R THIGH (E-G)   R FULL LIMB (A-G)   Limb Volume differential (LVD)    Volume change since initial %  Volume change overall   (Blank rows = not tested)  LANDMARK INITIAL LEFT  07/31/22  R LEG (A-D) 7224.4 ml  R THIGH (E-G) ml  R FULL LIMB (A-G) ml  Limb Volume differential (LVD)  4.6%, L leg  > R leg  Volume change since initial %  Volume change overall %  (Blank rows = not tested)   LYMPHEDEMA LIFE IMPACT SCALE (LLIS): 41.18%  (The extent to which LE-related problems affected your life in the  past week.)  FOTO Outcome Measure: 58%   TODAY'S TREATMENT:                                                                                                                                         DATE:   PATIENT EDUCATION: Continued Pt/ CG edu for lymphedema self care home program throughout session. Topics include outcome of comparative limb volumetrics- starting limb volume differentials (LVDs), technology and gradient techniques used for short stretch, multilayer compression wrapping, simple self-MLD, therapeutic lymphatic pumping exercises, skin/nail care, LE precautions,. compression garment recommendations and specifications, wear and care schedule and compression garment donning / doffing w assistive devices. Discussed progress towards all OT goals since commencing CDT. All questions answered to the Pt's satisfaction. Good return. Person educated: Patient, adult daughter Latoya Sloan  Education method: Explanation, Demonstration, Handouts, and needs additional edu Education comprehension: verbalized understanding, returned demonstration, and needs further education  LYMPHEDEMA SELF-CARE HOME PROGRAM 1.Therapeutic lymphatic pumping therex- 2 sets of 10 reps, hold 5 seconds; elements in order. 2 x daily PRN 2.  Full time, daily compression: Intensive Phase CDT: multilayer short stretch bandages from toes to popliteal using gradient techniques  Self-management Phase CDT: Attempt to use existing OTS, light duty adjustable leggings. Consider HOS devices  to limit fibrosis (Jobst Relax ccl 2) 3. Daily skin care to sustain optimal hydration. Wash bites, scratches, blisters, etc with mild soap and water , apply antibacterial first aide cream and a band aide. 4. Simple self-Manual lymphatic drainage (MLD) PRN.:  ASSESSMENT:  CLINICAL IMPRESSION: Pt now able to apply multilayer compression wraps independently with a little extra time, which meets and exceeds goal. Excellent tolerance for MLD  as established today. No pain with J strokes and fibrosis techniques. . Reapplied multilayered compression wraps as established using gradient techniques. Cont as per POC.  OBJECTIVE IMPAIRMENTS: decreased knowledge of condition, decreased knowledge of use of DME, decreased mobility, difficulty walking, decreased ROM, increased edema, obesity, and pain.   ACTIVITY LIMITATIONS: decreased standing and walking tolerance > 30 minutes,  carrying, lifting, bending, extended dependent sitting, squatting, stairs, transfers, lower body bathing, LB grooming, LB dressing, productive, leisure and social participation requiring walking, standing and/ or dependent sitting >30 minutes ( attending social and community activities)  PERSONAL FACTORS: Age, time since onset, and 3+ co morbidities: CHF, OA, HTN are also affecting patient's functional outcome.   REHAB POTENTIAL: Good- with daily CG assistance with compression wraps during Intensive CDT. Poor without assistance with multilayer wraps  EVALUATION COMPLEXITY: moderate   GOALS: Goals reviewed with patient? Yes  SHORT TERM GOALS: Target date: 4th OT  Rx visit   Pt will demonstrate understanding of lymphedema precautions and prevention strategies with modified independence using a printed reference to identify at least 5 precautions and discussing how s/he may implement them into daily life to reduce risk of progression with modified assistance. Baseline:Max A Goal status: INITIAL  2.   With Max caregiver assistance Pt will be able to apply multilayer, knee length, compression wraps using gradient techniques to decrease limb volume, to limit infection risk, and to limit lymphedema progression.  Baseline:Max A Goal status: 08/07/22 GOAL MET w caregiver. 08/13/22 Goal exceeded with Pt modified independent with wraps to knee.  LONG TERM GOALS: Target date: 10/27/22  Given this patient's Intake score of 58 /100% on the functional outcomes FOTO tool, patient  will experience an increase in function of 3 points to improve basic and instrumental ADLs performance, including lymphedema self-care. (TBA at first OT Rx visit) Baseline: Dependent Goal status: INITIAL  2.  Given this patient's Intake score of 41.18% on the Lymphedema Life Impact Scale (LLIS), patient will experience a reduction of at least 5% in her perceived level of functional impairment resulting from lymphedema to improve functional performance and quality of life (QOL). Baseline: Dependent Goal status: INITIAL  3.  With modified assistance (extra time) Pt will be able to don and doff appropriate compression garments and/or devices using correct calibration  for ~ 20-30 mmHg  to control BLE lymphedema and to limit progression.  Baseline: Mod A Goal status: INITIAL  4.  Pt will achieve at least a 10% volume reductions bilaterally below the knees to return limb to more typical size and shape, to limit infection risk and LE progression, to decrease pain, to improve function, and to improve body image and QOL. Baseline: Max A Goal status: INITIAL  5.  During Intensive phase CDT Pt will achieve at least 85% compliance with all lymphedema self-care home program components, including  daily skin care, multilayer , gradient compression wraps with daily changes, daily simple self MLD and daily lymphatic pumping therex to achieve optimal clinical outcome and to habituate self care regime for optimal LE self-management over time. Baseline: Max A Goal status: INITIAL   PLAN:  PT FREQUENCY: 2x/week, and PRN  PT DURATION: 12 weeks  PLANNED INTERVENTIONS: Complete Decongestive Therapy (CDT) Therapeutic exercises, Therapeutic activity, Patient/Family education, Self Care, DME instructions, Manual lymph drainage, Compression bandaging, and Manual therapy; fit  with appropriate compression garments that control swelling and that Pt can don and doff          with extra time and/ or assistive devices;  consider trial with advanced sequential pneumatic compression device (Flexitouch) trial which mimics proximal to distal MLD for optimal lymphatic return.  Multilayer compression bandaging below the knee to single leg at a time to limit falls risk. Utilizing one each 8, 10 and 12 cm wide x 5 meters long, short stretch   bandages applied in staggered gradient fashion over single layer of .04 cm thick Rosidal foam applied circumferentially  over cotton stockinett from toes to popliteal fossa.  MLD to RLE/RLQ utilizing modified short neck sequence utilizing strokes to clavicles only. ( NO STROKES TO LATERAL NECK BILATERALLY)  diaphragmatic breathing to activate deep abdominal lymphatics and thoracic duct, functional inguinal LNs, and proximal to distal J strokes to thigh, knee, leg, ankle and foot, then retrograde sweep to finish w/ modified short neck sequence.  PLAN FOR NEXT SESSION:  MLD to RLE w simultaneous skin care Knee length multilayer compression wraps Pt/ family edu  Andrey Spearman, MS, OTR/L, CLT-LANA 08/15/22 8:55 AM

## 2022-08-19 ENCOUNTER — Ambulatory Visit: Payer: Medicare Other | Admitting: Occupational Therapy

## 2022-08-19 ENCOUNTER — Encounter: Payer: Self-pay | Admitting: Occupational Therapy

## 2022-08-19 DIAGNOSIS — I890 Lymphedema, not elsewhere classified: Secondary | ICD-10-CM | POA: Diagnosis not present

## 2022-08-19 NOTE — Therapy (Signed)
OUTPATIENT OCCUPATIONAL THERAPY TREATMENT NOTE LOWER EXTREMITY LYMPHEDEMA  Patient Name: Latoya Sloan MRN: [id number] DOB:1931/07/09, 89 y.o., female Today's Date: 08/19/2022  END OF SESSION:  OT End of Session - 08/19/22 0814     Visit Number 7    Number of Visits 36    Date for OT Re-Evaluation 10/27/22    OT Start Time 0803    OT Stop Time 0905    OT Time Calculation (min) 62 min    Activity Tolerance Patient tolerated treatment well;No increased pain    Behavior During Therapy WFL for tasks assessed/performed                    Past Medical History:  Diagnosis Date   Hypertension    Lymphedema of both lower extremities    Thyroid disease    History reviewed. No pertinent surgical history. Patient Active Problem List   Diagnosis Date Noted   Bradycardia 01/16/2021   Chronic diastolic CHF (congestive heart failure), NYHA class 2 ([place]) 04/06/2020   Leg numbness 03/20/2020   Leg swelling 03/20/2020   Lymphedema of both lower extremities 03/20/2020   Anosmia 03/06/2020   Bilateral carotid artery stenosis 03/06/2020   Chronic cough 03/06/2020   Dyspnea on exertion 03/06/2020   GERD (gastroesophageal reflux disease) 03/06/2020   Hyperlipidemia 03/06/2020   Lymphedema 03/06/2020   Migraines 03/06/2020   Osteoarthritis of both knees 03/06/2020   Idiopathic peripheral neuropathy 12/29/2013   Localized, primary osteoarthritis 12/28/2013   Osteoarthritis of knee 12/28/2013   Essential hypertension 10/23/2010   Family history of stroke 10/22/2010    PCP: Gladstone Lighter, MD  REFERRING PROVIDER: Eulogio Ditch, NP  REFERRING DIAG: I89.0  THERAPY DIAG:  Lymphedema, not elsewhere classified  ONSET DATE: >10 yrs  SUBJECTIVE:                                                                                                                                                                                           SUBJECTIVE STATEMENT: Latoya Sloan presents for  Occupational Therapy for BLE lymphedema. Pt is unaccompanied. Pt reports 5/10 OA knee pain in R knee. She denies LE-related pain. Pt reports wrapping went well during visit interval. Pt is wrapping herself now , which exceeds her expectations. She tolerated wraps without pain.    PERTINENT HISTORY: CHF, chronic , progressive lymphedema BLE, thyroid disease, Bradycardia, B knee OA, Periferal neuropathy, B carotid stenosis  PAIN:  Are you having pain? Yes: NPRS scale: 5/10 Pain location: R knee Pain description: sore, tender, heavy, tight, achy Aggravating factors: weight bearing Relieving factors: sitting, elevation, compression  PRECAUTIONS: Other: THYROID, B CAROTID  ARTERY STENOSIS (NO strokes to lateral neck!) , CHF, BRADYCARDIA  FALLS:  Has patient fallen since last visit? No  HAND DOMINANCE: right   PRIOR LEVEL OF FUNCTION: Independent with basic ADLs, Independent with household mobility with device, Independent with community mobility with device, and Needs assistance with homemaking  PATIENT GOALS: "Because of the swelling, and I want to walk more. [name] gotten lazy about my walking.When I go to the vein doctor they wrapped my legs a couple of times, but I don't see them getting better."   OBJECTIVE:  OBSERVATIONS / OTHER ASSESSMENTS:  HX WOUNDS: denies  Hx non-pressure related INFECTIONS: denied  BLE COMPARATIVE LIMB VOLUMETRICS  LANDMARK  INITIAL RIGHT  07/31/22  R LEG (A-D) 7570.2 ml  R THIGH (E-G)   R FULL LIMB (A-G)   Limb Volume differential (LVD)    Volume change since initial %  Volume change overall   (Blank rows = not tested)  LANDMARK INITIAL LEFT  07/31/22  R LEG (A-D) 7224.4 ml  R THIGH (E-G) ml  R FULL LIMB (A-G) ml  Limb Volume differential (LVD)  4.6%, L leg  > R leg  Volume change since initial %  Volume change overall %  (Blank rows = not tested)   LYMPHEDEMA LIFE IMPACT SCALE (LLIS): 41.18%  (The extent to which LE-related problems affected  your life in the past week.)  FOTO Outcome Measure: 58%   TODAY'S TREATMENT:                                                                                                                                         DATE:   PATIENT EDUCATION: Continued Pt/ CG edu for lymphedema self care home program throughout session. Topics include outcome of comparative limb volumetrics- starting limb volume differentials (LVDs), technology and gradient techniques used for short stretch, multilayer compression wrapping, simple self-MLD, therapeutic lymphatic pumping exercises, skin/nail care, LE precautions,. compression garment recommendations and specifications, wear and care schedule and compression garment donning / doffing w assistive devices. Discussed progress towards all OT goals since commencing CDT. All questions answered to the Pt's satisfaction. Good return. Person educated: Patient, adult daughter Latoya Sloan  Education method: Explanation, Demonstration, Handouts, and needs additional edu Education comprehension: verbalized understanding, returned demonstration, and needs further education  LYMPHEDEMA SELF-CARE HOME PROGRAM 1.Therapeutic lymphatic pumping therex- 2 sets of 10 reps, hold 5 seconds; elements in order. 2 x daily PRN 2.  Full time, daily compression: Intensive Phase CDT: multilayer short stretch bandages from toes to popliteal using gradient techniques  Self-management Phase CDT: Attempt to use existing OTS, light duty adjustable leggings. Consider HOS devices  to limit fibrosis (Jobst Relax ccl 2) 3. Daily skin care to sustain optimal hydration. Wash bites, scratches, blisters, etc with mild soap and water , apply antibacterial first aide cream and a band aide. 4. Simple self-Manual lymphatic drainage (MLD) PRN.: 5  Pt will benefit from daily use of advanced sequential compression device- Flexitouch- during self-management phase of CDT to assist with regular lymphatic decongestion  to limit progression as Pt has difficulty reaching her distal legs and feet to perform simple self-MLD.Pt has used a basic Biotab device daily greater than 1 year with significant physical and sensory symptoms of lymphedema remaining.   ASSESSMENT:  CLINICAL IMPRESSION: Visible volume reduction below the knee in the RLE is obvious today when compression removed. Palpable fatty fibrosis is unchanged. LE-related pain persists as is arthritis pain in knees. Pt tolerated MLD without increased pain. Observed LE precautions avoiding lateral neck. Reapplied multilayered compression wraps as established using gradient techniques. Cont as per POC.  OBJECTIVE IMPAIRMENTS: decreased knowledge of condition, decreased knowledge of use of DME, decreased mobility, difficulty walking, decreased ROM, increased edema, obesity, and pain.   ACTIVITY LIMITATIONS: decreased standing and walking tolerance > 30 minutes,  carrying, lifting, bending, extended dependent sitting, squatting, stairs, transfers, lower body bathing, LB grooming, LB dressing, productive, leisure and social participation requiring walking, standing and/ or dependent sitting >30 minutes ( attending social and community activities)  PERSONAL FACTORS: Age, time since onset, and 3+ co morbidities: CHF, OA, HTN are also affecting patient's functional outcome.   REHAB POTENTIAL: Good- with daily CG assistance with compression wraps during Intensive CDT. Poor without assistance with multilayer wraps  EVALUATION COMPLEXITY: moderate   GOALS: Goals reviewed with patient? Yes  SHORT TERM GOALS: Target date: 4th OT  Rx visit   Pt will demonstrate understanding of lymphedema precautions and prevention strategies with modified independence using a printed reference to identify at least 5 precautions and discussing how s/he may implement them into daily life to reduce risk of progression with modified assistance. Baseline:Max A Goal status: INITIAL  2.    With Max caregiver assistance Pt will be able to apply multilayer, knee length, compression wraps using gradient techniques to decrease limb volume, to limit infection risk, and to limit lymphedema progression.  Baseline:Max A Goal status: 08/07/22 GOAL MET w caregiver. 08/13/22 Goal exceeded with Pt modified independent with wraps to knee.  LONG TERM GOALS: Target date: 10/27/22  Given this patient's Intake score of 58 /100% on the functional outcomes FOTO tool, patient will experience an increase in function of 3 points to improve basic and instrumental ADLs performance, including lymphedema self-care. (TBA at first OT Rx visit) Baseline: Dependent Goal status: INITIAL  2.  Given this patient's Intake score of 41.18% on the Lymphedema Life Impact Scale (LLIS), patient will experience a reduction of at least 5% in her perceived level of functional impairment resulting from lymphedema to improve functional performance and quality of life (QOL). Baseline: Dependent Goal status: INITIAL  3.  With modified assistance (extra time) Pt will be able to don and doff appropriate compression garments and/or devices using correct calibration  for ~ 20-30 mmHg  to control BLE lymphedema and to limit progression.  Baseline: Mod A Goal status: INITIAL  4.  Pt will achieve at least a 10% volume reductions bilaterally below the knees to return limb to more typical size and shape, to limit infection risk and LE progression, to decrease pain, to improve function, and to improve body image and QOL. Baseline: Max A Goal status: INITIAL  5.  During Intensive phase CDT Pt will achieve at least 85% compliance with all lymphedema self-care home program components, including  daily skin care, multilayer , gradient compression wraps with daily changes, daily simple self MLD  and daily lymphatic pumping therex to achieve optimal clinical outcome and to habituate self care regime for optimal LE self-management over  time. Baseline: Max A Goal status: INITIAL   PLAN:  PT FREQUENCY: 2x/week, and PRN  PT DURATION: 12 weeks  PLANNED INTERVENTIONS: Complete Decongestive Therapy (CDT) Therapeutic exercises, Therapeutic activity, Patient/Family education, Self Care, DME instructions, Manual lymph drainage, Compression bandaging, and Manual therapy; fit with appropriate compression garments that control swelling and that Pt can don and doff          with extra time and/ or assistive devices; consider trial with advanced sequential pneumatic compression device (Flexitouch) trial which mimics proximal to distal MLD for optimal lymphatic return.  Multilayer compression bandaging below the knee to single leg at a time to limit falls risk. Utilizing one each 8, 10 and 12 cm wide x 5 meters long, short stretch   bandages applied in staggered gradient fashion over single layer of .04 cm thick Rosidal foam applied circumferentially  over cotton stockinett from toes to popliteal fossa.  MLD to RLE/RLQ utilizing modified short neck sequence utilizing strokes to clavicles only. ( NO STROKES TO LATERAL NECK BILATERALLY)  diaphragmatic breathing to activate deep abdominal lymphatics and thoracic duct, functional inguinal LNs, and proximal to distal J strokes to thigh, knee, leg, ankle and foot, then retrograde sweep to finish w/ modified short neck sequence.  PLAN FOR NEXT SESSION:  MLD to RLE w simultaneous skin care Knee length multilayer compression wraps Pt/ family edu Flexitouch trial ASAP is insurance benefits are accessible.  Andrey Spearman, MS, OTR/L, CLT-LANA 08/19/22 11:22 AM

## 2022-08-21 ENCOUNTER — Encounter: Payer: Self-pay | Admitting: Occupational Therapy

## 2022-08-21 ENCOUNTER — Ambulatory Visit: Payer: Medicare Other | Admitting: Occupational Therapy

## 2022-08-21 DIAGNOSIS — I890 Lymphedema, not elsewhere classified: Secondary | ICD-10-CM

## 2022-08-21 NOTE — Therapy (Signed)
OUTPATIENT OCCUPATIONAL THERAPY TREATMENT NOTE LOWER EXTREMITY LYMPHEDEMA  Patient Name: Danial Flippin MRN: [id number] DOB:May 13, 1932, 89 y.o., female Today's Date: 08/21/2022  END OF SESSION:  OT End of Session - 08/21/22 1413     Visit Number 8    Number of Visits 36    Date for OT Re-Evaluation 10/27/22    OT Start Time 0205    OT Stop Time 0255    OT Time Calculation (min) 50 min    Activity Tolerance Patient tolerated treatment well;No increased pain    Behavior During Therapy WFL for tasks assessed/performed                    Past Medical History:  Diagnosis Date   Hypertension    Lymphedema of both lower extremities    Thyroid disease    History reviewed. No pertinent surgical history. Patient Active Problem List   Diagnosis Date Noted   Bradycardia 01/16/2021   Chronic diastolic CHF (congestive heart failure), NYHA class 2 ([place]) 04/06/2020   Leg numbness 03/20/2020   Leg swelling 03/20/2020   Lymphedema of both lower extremities 03/20/2020   Anosmia 03/06/2020   Bilateral carotid artery stenosis 03/06/2020   Chronic cough 03/06/2020   Dyspnea on exertion 03/06/2020   GERD (gastroesophageal reflux disease) 03/06/2020   Hyperlipidemia 03/06/2020   Lymphedema 03/06/2020   Migraines 03/06/2020   Osteoarthritis of both knees 03/06/2020   Idiopathic peripheral neuropathy 12/29/2013   Localized, primary osteoarthritis 12/28/2013   Osteoarthritis of knee 12/28/2013   Essential hypertension 10/23/2010   Family history of stroke 10/22/2010    PCP: Gladstone Lighter, MD  REFERRING PROVIDER: Eulogio Ditch, NP  REFERRING DIAG: I89.0  THERAPY DIAG:  Lymphedema, not elsewhere classified  ONSET DATE: >10 yrs  SUBJECTIVE:                                                                                                                                                                                           SUBJECTIVE STATEMENT: [name] presents for  Occupational Therapy for BLE lymphedema. Pt is accompanied by her daughter who waits outside during visit. Pt reports 1/10 OA knee pain in R knee w weight bearing. She denies LE-related pain. Pt reports wrapping went well during visit interval. Pt is wrapping herself now; "I just do the best I can."  PERTINENT HISTORY: CHF, chronic , progressive lymphedema BLE, thyroid disease, Bradycardia, B knee OA, Periferal neuropathy, B carotid stenosis  PAIN:  Are you having pain? Yes: NPRS scale: 1/10 Pain location: R knee Pain description: sore, tender, heavy, tight, achy Aggravating factors: weight bearing Relieving factors: sitting, elevation, compression  PRECAUTIONS: Other: THYROID, B CAROTID ARTERY STENOSIS (NO strokes to lateral neck!) , CHF, BRADYCARDIA  FALLS:  Has patient fallen since last visit? No  HAND DOMINANCE: right   PRIOR LEVEL OF FUNCTION: Independent with basic ADLs, Independent with household mobility with device, Independent with community mobility with device, and Needs assistance with homemaking  PATIENT GOALS: "Because of the swelling, and I want to walk more. [name] gotten lazy about my walking.When I go to the vein doctor they wrapped my legs a couple of times, but I don't see them getting better."   OBJECTIVE:  OBSERVATIONS / OTHER ASSESSMENTS:  HX WOUNDS: denies  Hx non-pressure related INFECTIONS: denied  BLE COMPARATIVE LIMB VOLUMETRICS  LANDMARK  INITIAL RIGHT  07/31/22  R LEG (A-D) 7570.2 ml  R THIGH (E-G)   R FULL LIMB (A-G)   Limb Volume differential (LVD)    Volume change since initial %  Volume change overall   (Blank rows = not tested)  LANDMARK INITIAL LEFT  07/31/22  R LEG (A-D) 7224.4 ml  R THIGH (E-G) ml  R FULL LIMB (A-G) ml  Limb Volume differential (LVD)  4.6%, L leg  > R leg  Volume change since initial %  Volume change overall %  (Blank rows = not tested)   LYMPHEDEMA LIFE IMPACT SCALE (LLIS): 41.18%  (The extent to which LE-related  problems affected your life in the past week.)  FOTO Outcome Measure: 58%   TODAY'S TREATMENT:                                                                                                                                         DATE:   PATIENT EDUCATION: Reviewed Lymphedema prevention strategies and precautions. Good return Person educated: Patient, adult daughter [name]  Education method: Explanation, Demonstration, Handouts, and needs additional edu Education comprehension: verbalized understanding, returned demonstration, and needs further education  LYMPHEDEMA SELF-CARE HOME PROGRAM 1.Therapeutic lymphatic pumping therex- 2 sets of 10 reps, hold 5 seconds; elements in order. 2 x daily PRN 2.  Full time, daily compression: Intensive Phase CDT: multilayer short stretch bandages from toes to popliteal using gradient techniques  Self-management Phase CDT: Attempt to use existing OTS, light duty adjustable leggings. Consider HOS devices  to limit fibrosis (Jobst Relax ccl 2) 3. Daily skin care to sustain optimal hydration. Wash bites, scratches, blisters, etc with mild soap and water , apply antibacterial first aide cream and a band aide. 4. Simple self-Manual lymphatic drainage (MLD) PRN.: 5 Pt will benefit from daily use of advanced sequential compression device- Flexitouch- during self-management phase of CDT to assist with regular lymphatic decongestion to limit progression as Pt has difficulty reaching her distal legs and feet to perform simple self-MLD.Pt has used a basic Biotab device daily greater than 1 year with significant physical and sensory symptoms of lymphedema remaining.   ASSESSMENT:  CLINICAL IMPRESSION: After skilled  training Pt able to identify >5 lymphedema precautions and apply them to her life situation with modified independence ( printed reference and extra time). GOAL MET. Provided Pt education throughout MLD. Pt tolerated manual therapy without pain.  Compression wraps applied as established.  Not as per POC.  OBJECTIVE IMPAIRMENTS: decreased knowledge of condition, decreased knowledge of use of DME, decreased mobility, difficulty walking, decreased ROM, increased edema, obesity, and pain.   ACTIVITY LIMITATIONS: decreased standing and walking tolerance > 30 minutes,  carrying, lifting, bending, extended dependent sitting, squatting, stairs, transfers, lower body bathing, LB grooming, LB dressing, productive, leisure and social participation requiring walking, standing and/ or dependent sitting >30 minutes ( attending social and community activities)  PERSONAL FACTORS: Age, time since onset, and 3+ co morbidities: CHF, OA, HTN are also affecting patient's functional outcome.   REHAB POTENTIAL: Good- with daily CG assistance with compression wraps during Intensive CDT. Poor without assistance with multilayer wraps  EVALUATION COMPLEXITY: moderate   GOALS: Goals reviewed with patient? Yes  SHORT TERM GOALS: Target date: 4th OT  Rx visit   Pt will demonstrate understanding of lymphedema precautions and prevention strategies with modified independence using a printed reference to identify at least 5 precautions and discussing how s/he may implement them into daily life to reduce risk of progression with modified assistance. Baseline:Max A Goal status:08/21/22 GOAL MET  2.   With Max caregiver assistance Pt will be able to apply multilayer, knee length, compression wraps using gradient techniques to decrease limb volume, to limit infection risk, and to limit lymphedema progression.  Baseline:Max A Goal status: 08/07/22 GOAL MET w caregiver. 08/13/22 Goal exceeded with Pt modified independent with wraps to knee.  LONG TERM GOALS: Target date: 10/27/22  Given this patient's Intake score of 58 /100% on the functional outcomes FOTO tool, patient will experience an increase in function of 3 points to improve basic and instrumental ADLs performance,  including lymphedema self-care. (TBA at first OT Rx visit) Baseline: Dependent Goal status: INITIAL  2.  Given this patient's Intake score of 41.18% on the Lymphedema Life Impact Scale (LLIS), patient will experience a reduction of at least 5% in her perceived level of functional impairment resulting from lymphedema to improve functional performance and quality of life (QOL). Baseline: Dependent Goal status: INITIAL  3.  With modified assistance (extra time) Pt will be able to don and doff appropriate compression garments and/or devices using correct calibration  for ~ 20-30 mmHg  to control BLE lymphedema and to limit progression.  Baseline: Mod A Goal status: INITIAL  4.  Pt will achieve at least a 10% volume reductions bilaterally below the knees to return limb to more typical size and shape, to limit infection risk and LE progression, to decrease pain, to improve function, and to improve body image and QOL. Baseline: Max A Goal status: INITIAL  5.  During Intensive phase CDT Pt will achieve at least 85% compliance with all lymphedema self-care home program components, including  daily skin care, multilayer , gradient compression wraps with daily changes, daily simple self MLD and daily lymphatic pumping therex to achieve optimal clinical outcome and to habituate self care regime for optimal LE self-management over time. Baseline: Max A Goal status: INITIAL   PLAN:  PT FREQUENCY: 2x/week, and PRN  PT DURATION: 12 weeks  PLANNED INTERVENTIONS: Complete Decongestive Therapy (CDT) Therapeutic exercises, Therapeutic activity, Patient/Family education, Self Care, DME instructions, Manual lymph drainage, Compression bandaging, and Manual therapy; fit with appropriate compression garments  that control swelling and that Pt can don and doff          with extra time and/ or assistive devices; consider trial with advanced sequential pneumatic compression device (Flexitouch) trial which mimics  proximal to distal MLD for optimal lymphatic return.  Multilayer compression bandaging below the knee to single leg at a time to limit falls risk. Utilizing one each 8, 10 and 12 cm wide x 5 meters long, short stretch   bandages applied in staggered gradient fashion over single layer of .04 cm thick Rosidal foam applied circumferentially  over cotton stockinett from toes to popliteal fossa.  MLD to RLE/RLQ utilizing modified short neck sequence utilizing strokes to clavicles only. ( NO STROKES TO LATERAL NECK BILATERALLY)  diaphragmatic breathing to activate deep abdominal lymphatics and thoracic duct, functional inguinal LNs, and proximal to distal J strokes to thigh, knee, leg, ankle and foot, then retrograde sweep to finish w/ modified short neck sequence.  PLAN FOR NEXT SESSION:  MLD to RLE w simultaneous skin care Knee length multilayer compression wraps Pt/ family edu Comparative limb volumetrics RLE  Andrey Spearman, MS, OTR/L, CLT-LANA 08/21/22 3:40 PM

## 2022-08-26 ENCOUNTER — Ambulatory Visit: Payer: Medicare Other | Admitting: Occupational Therapy

## 2022-08-26 DIAGNOSIS — I890 Lymphedema, not elsewhere classified: Secondary | ICD-10-CM

## 2022-08-26 NOTE — Therapy (Signed)
OUTPATIENT OCCUPATIONAL THERAPY TREATMENT NOTE LOWER EXTREMITY LYMPHEDEMA  Patient Name: Latoya Sloan MRN: [id number] DOB:Apr 30, 1932, 89 y.o., female Today's Date: 08/26/2022  END OF SESSION:  OT End of Session - 08/26/22 0813     Visit Number 9    Number of Visits 36    Date for OT Re-Evaluation 10/27/22    OT Start Time 0803    OT Stop Time 0902    OT Time Calculation (min) 59 min    Activity Tolerance Patient tolerated treatment well;No increased pain    Behavior During Therapy WFL for tasks assessed/performed                    Past Medical History:  Diagnosis Date   Hypertension    Lymphedema of both lower extremities    Thyroid disease    No past surgical history on file. Patient Active Problem List   Diagnosis Date Noted   Bradycardia 01/16/2021   Chronic diastolic CHF (congestive heart failure), NYHA class 2 ([place]) 04/06/2020   Leg numbness 03/20/2020   Leg swelling 03/20/2020   Lymphedema of both lower extremities 03/20/2020   Anosmia 03/06/2020   Bilateral carotid artery stenosis 03/06/2020   Chronic cough 03/06/2020   Dyspnea on exertion 03/06/2020   GERD (gastroesophageal reflux disease) 03/06/2020   Hyperlipidemia 03/06/2020   Lymphedema 03/06/2020   Migraines 03/06/2020   Osteoarthritis of both knees 03/06/2020   Idiopathic peripheral neuropathy 12/29/2013   Localized, primary osteoarthritis 12/28/2013   Osteoarthritis of knee 12/28/2013   Essential hypertension 10/23/2010   Family history of stroke 10/22/2010    PCP: Gladstone Lighter, MD  REFERRING PROVIDER: Eulogio Ditch, NP  REFERRING DIAG: I89.0  THERAPY DIAG:  Lymphedema, not elsewhere classified  ONSET DATE: >10 yrs  SUBJECTIVE:                                                                                                                                                                                           SUBJECTIVE STATEMENT: Latoya Sloan presents for Occupational  Therapy for BLE lymphedema. Pt is accompanied by her daughter who waits outside throughout visit. Pt reports 5/10 OA knee pain in R knee w weight bearing. She denies LE-related pain. Pt reports wrapping went well during visit interval. Pt has more trouble completing wbc to Rx bed transfer today than usual.  PERTINENT HISTORY: CHF, chronic , progressive lymphedema BLE, thyroid disease, Bradycardia, B knee OA, Periferal neuropathy, B carotid stenosis  PAIN:  Are you having pain? Yes: NPRS scale: 5/10 Pain location: R knee Pain description: sore, tender, heavy, tight, achy Aggravating factors: weight bearing Relieving factors: sitting, elevation,  compression  PRECAUTIONS: Other: THYROID, B CAROTID ARTERY STENOSIS (NO strokes to lateral neck!) , CHF, BRADYCARDIA  FALLS:  Has patient fallen since last visit? No  HAND DOMINANCE: right   PRIOR LEVEL OF FUNCTION: Independent with basic ADLs, Independent with household mobility with device, Independent with community mobility with device, and Needs assistance with homemaking  PATIENT GOALS: "Because of the swelling, and I want to walk more. [name] gotten lazy about my walking.When I go to the vein doctor they wrapped my legs a couple of times, but I don't see them getting better."   OBJECTIVE:  OBSERVATIONS / OTHER ASSESSMENTS:   BLE COMPARATIVE LIMB VOLUMETRICS   LANDMARK  RIGHT  08/26/22 (9th visit)  R LEG (A-D) 6997.3 ml  R THIGH (E-G)   R FULL LIMB (A-G)   Limb Volume differential (LVD)    Volume change since initial Decreased 7.6%  Volume change overall   (Blank rows = not tested)  LANDMARK  INITIAL RIGHT  07/31/22  R LEG (A-D) 7570.2 ml  R THIGH (E-G)   R FULL LIMB (A-G)   Limb Volume differential (LVD)    Volume change since initial %  Volume change overall   (Blank rows = not tested)  LANDMARK INITIAL LEFT  07/31/22  R LEG (A-D) 7224.4 ml  R THIGH (E-G) ml  R FULL LIMB (A-G) ml  Limb Volume differential (LVD)  4.6%, L leg   > R leg  Volume change since initial %  Volume change overall %  (Blank rows = not tested)   LYMPHEDEMA LIFE IMPACT SCALE (LLIS): 41.18%  (The extent to which LE-related problems affected your life in the past week.)  FOTO Outcome Measure: 58%   TODAY'S TREATMENT:                                                                                                                                         DATE:   PATIENT EDUCATION:  Reviewed outcome of limb volumetrics and encouraged Pt to continue her hard work on LE self care between visits Person educated: Patient Education method: Explanation, Demonstration, Handouts, and needs additional edu Education comprehension: verbalized understanding, returned demonstration, and needs further education  LYMPHEDEMA SELF-CARE HOME PROGRAM 1.Therapeutic lymphatic pumping therex- 2 sets of 10 reps, hold 5 seconds; elements in order. 2 x daily PRN 2.  Full time, daily compression: Intensive Phase CDT: multilayer short stretch bandages from toes to popliteal using gradient techniques  Self-management Phase CDT: Attempt to use existing OTS, light duty adjustable leggings. Consider HOS devices  to limit fibrosis (Jobst Relax ccl 2) 3. Daily skin care to sustain optimal hydration. Wash bites, scratches, blisters, etc with mild soap and water , apply antibacterial first aide cream and a band aide. 4. Simple self-Manual lymphatic drainage (MLD) PRN.: 5 Pt will benefit from daily use of advanced sequential compression device- Flexitouch- during self-management phase of CDT  to assist with regular lymphatic decongestion to limit progression as Pt has difficulty reaching her distal legs and feet to perform simple self-MLD.Pt has used a basic Biotab device daily greater than 1 year with significant physical and sensory symptoms of lymphedema remaining.   ASSESSMENT:  CLINICAL IMPRESSION: Comparative limb volumetrics completed today in prep for progress review  next visit reveals 7.6% limb volume reduction below the knee in the RLE since commencing OT for CDT. This is excellent progress on limb reduction goal in first 9 visits when first 2 visits did not involve manual therapy. Reapplied gradient compression wraps as established. Pt states she is pleased with progress to date.   OBJECTIVE IMPAIRMENTS: decreased knowledge of condition, decreased knowledge of use of DME, decreased mobility, difficulty walking, decreased ROM, increased edema, obesity, and pain.   ACTIVITY LIMITATIONS: decreased standing and walking tolerance > 30 minutes,  carrying, lifting, bending, extended dependent sitting, squatting, stairs, transfers, lower body bathing, LB grooming, LB dressing, productive, leisure and social participation requiring walking, standing and/ or dependent sitting >30 minutes ( attending social and community activities)  PERSONAL FACTORS: Age, time since onset, and 3+ co morbidities: CHF, OA, HTN are also affecting patient's functional outcome.   REHAB POTENTIAL: Good- with daily CG assistance with compression wraps during Intensive CDT. Poor without assistance with multilayer wraps  EVALUATION COMPLEXITY: moderate   GOALS: Goals reviewed with patient? Yes  SHORT TERM GOALS: Target date: 4th OT  Rx visit   Pt will demonstrate understanding of lymphedema precautions and prevention strategies with modified independence using a printed reference to identify at least 5 precautions and discussing how s/he may implement them into daily life to reduce risk of progression with modified assistance. Baseline:Max A Goal status:08/21/22 GOAL MET  2.   With Max caregiver assistance Pt will be able to apply multilayer, knee length, compression wraps using gradient techniques to decrease limb volume, to limit infection risk, and to limit lymphedema progression.  Baseline:Max A Goal status: 08/07/22 GOAL MET w caregiver. 08/13/22 Goal exceeded with Pt modified  independent with wraps to knee.  LONG TERM GOALS: Target date: 10/27/22  Given this patient's Intake score of 58 /100% on the functional outcomes FOTO tool, patient will experience an increase in function of 3 points to improve basic and instrumental ADLs performance, including lymphedema self-care. (TBA at first OT Rx visit) Baseline: Dependent Goal status: INITIAL  2.  Given this patient's Intake score of 41.18% on the Lymphedema Life Impact Scale (LLIS), patient will experience a reduction of at least 5% in her perceived level of functional impairment resulting from lymphedema to improve functional performance and quality of life (QOL). Baseline: Dependent Goal status: INITIAL  3.  With modified assistance (extra time) Pt will be able to don and doff appropriate compression garments and/or devices using correct calibration  for ~ 20-30 mmHg  to control BLE lymphedema and to limit progression.  Baseline: Mod A Goal status: INITIAL  4.  Pt will achieve at least a 10% volume reductions bilaterally below the knees to return limb to more typical size and shape, to limit infection risk and LE progression, to decrease pain, to improve function, and to improve body image and QOL. Baseline: Max A Goal status: 08/26/22: RLE: 7.6% volume reduction achieved in RLE measured on 9th visit.  5.  During Intensive phase CDT Pt will achieve at least 85% compliance with all lymphedema self-care home program components, including  daily skin care, multilayer , gradient compression wraps with  daily changes, daily simple self MLD and daily lymphatic pumping therex to achieve optimal clinical outcome and to habituate self care regime for optimal LE self-management over time. Baseline: Max A Goal status: INITIAL   PLAN:  PT FREQUENCY: 2x/week, and PRN  PT DURATION: 12 weeks  PLANNED INTERVENTIONS: Complete Decongestive Therapy (CDT) Therapeutic exercises, Therapeutic activity, Patient/Family education, Self  Care, DME instructions, Manual lymph drainage, Compression bandaging, and Manual therapy; fit with appropriate compression garments that control swelling and that Pt can don and doff          with extra time and/ or assistive devices; consider trial with advanced sequential pneumatic compression device (Flexitouch) trial which mimics proximal to distal MLD for optimal lymphatic return.  Multilayer compression bandaging below the knee to single leg at a time to limit falls risk. Utilizing one each 8, 10 and 12 cm wide x 5 meters long, short stretch   bandages applied in staggered gradient fashion over single layer of .04 cm thick Rosidal foam applied circumferentially  over cotton stockinett from toes to popliteal fossa.  MLD to RLE/RLQ utilizing modified short neck sequence utilizing strokes to clavicles only. ( NO STROKES TO LATERAL NECK BILATERALLY)  diaphragmatic breathing to activate deep abdominal lymphatics and thoracic duct, functional inguinal LNs, and proximal to distal J strokes to thigh, knee, leg, ankle and foot, then retrograde sweep to finish w/ modified short neck sequence.  PLAN FOR NEXT SESSION:  MLD to RLE w simultaneous skin care Knee length multilayer compression wraps Review Progress to date. Cont Pt edu for LE self  care  Andrey Spearman, MS, OTR/L, CLT-LANA 08/26/22 2:03 PM

## 2022-08-28 ENCOUNTER — Ambulatory Visit: Payer: Medicare Other | Admitting: Occupational Therapy

## 2022-08-29 ENCOUNTER — Ambulatory Visit: Payer: Medicare Other | Admitting: Occupational Therapy

## 2022-08-29 ENCOUNTER — Encounter: Payer: Self-pay | Admitting: Occupational Therapy

## 2022-08-29 DIAGNOSIS — I890 Lymphedema, not elsewhere classified: Secondary | ICD-10-CM | POA: Diagnosis not present

## 2022-08-29 NOTE — Therapy (Signed)
OUTPATIENT OCCUPATIONAL THERAPY TREATMENT NOTE & PROGRESS REPORT LOWER EXTREMITY LYMPHEDEMA  Patient Name: Latoya Sloan MRN: [id number] DOB:1931-09-14, 89 y.o., female Today's Date: 08/29/2022  REPORTING PERIOD:  07/29/32 - 08/29/22  END OF SESSION:  OT End of Session - 08/29/22 0907     Visit Number 10    Number of Visits 36    Date for OT Re-Evaluation 10/27/22    OT Start Time 0900    Activity Tolerance Patient tolerated treatment well;No increased pain    Behavior During Therapy WFL for tasks assessed/performed                    Past Medical History:  Diagnosis Date   Hypertension    Lymphedema of both lower extremities    Thyroid disease    History reviewed. No pertinent surgical history. Patient Active Problem List   Diagnosis Date Noted   Bradycardia 01/16/2021   Chronic diastolic CHF (congestive heart failure), NYHA class 2 ([place]) 04/06/2020   Leg numbness 03/20/2020   Leg swelling 03/20/2020   Lymphedema of both lower extremities 03/20/2020   Anosmia 03/06/2020   Bilateral carotid artery stenosis 03/06/2020   Chronic cough 03/06/2020   Dyspnea on exertion 03/06/2020   GERD (gastroesophageal reflux disease) 03/06/2020   Hyperlipidemia 03/06/2020   Lymphedema 03/06/2020   Migraines 03/06/2020   Osteoarthritis of both knees 03/06/2020   Idiopathic peripheral neuropathy 12/29/2013   Localized, primary osteoarthritis 12/28/2013   Osteoarthritis of knee 12/28/2013   Essential hypertension 10/23/2010   Family history of stroke 10/22/2010    PCP: Gladstone Lighter, MD  REFERRING PROVIDER: Eulogio Ditch, NP  REFERRING DIAG: I89.0  THERAPY DIAG:  Lymphedema, not elsewhere classified  ONSET DATE: >10 yrs  SUBJECTIVE:                                                                                                                                                                                           SUBJECTIVE STATEMENT: Latoya Sloan presents for  Occupational Therapy for BLE lymphedema. Pt is unaccompanied today. Pt reports 5/10 OA knee pain in R knee w weight bearing. She denies LE-related pain. Pt reports wrapping went well during visit interval. Pt able to complete transfer easier this morning morning. She has her walker with her instead of using transport wc. Compression wraps in place.  PERTINENT HISTORY: CHF, chronic , progressive lymphedema BLE, thyroid disease, Bradycardia, B knee OA, Periferal neuropathy, B carotid stenosis  PAIN:  Are you having pain? Yes: NPRS scale: 5/10 Pain location: R knee Pain description: sore, tender, heavy, tight, achy Aggravating factors: weight bearing Relieving factors: sitting, elevation, compression  PRECAUTIONS: Other: THYROID, B CAROTID ARTERY STENOSIS (NO strokes to lateral neck!) , CHF, BRADYCARDIA  FALLS:  Has patient fallen since last visit? No  HAND DOMINANCE: right   PRIOR LEVEL OF FUNCTION: Independent with basic ADLs, Independent with household mobility with device, Independent with community mobility with device, and Needs assistance with homemaking  PATIENT GOALS: "Because of the swelling, and I want to walk more. [name] gotten lazy about my walking.When I go to the vein doctor they wrapped my legs a couple of times, but I don't see them getting better."   OBJECTIVE:  OBSERVATIONS / OTHER ASSESSMENTS:   BLE COMPARATIVE LIMB VOLUMETRICS   LANDMARK  RIGHT  08/26/22 (9th visit)  R LEG (A-D) 6997.3 ml  R THIGH (E-G)   R FULL LIMB (A-G)   Limb Volume differential (LVD)    Volume change since initial Decreased 7.6%  Volume change overall   (Blank rows = not tested)  LANDMARK  INITIAL RIGHT  07/31/22  R LEG (A-D) 7570.2 ml  R THIGH (E-G)   R FULL LIMB (A-G)   Limb Volume differential (LVD)    Volume change since initial %  Volume change overall   (Blank rows = not tested)  LANDMARK INITIAL LEFT  07/31/22  R LEG (A-D) 7224.4 ml  R THIGH (E-G) ml  R FULL LIMB (A-G) ml   Limb Volume differential (LVD)  4.6%, L leg  > R leg  Volume change since initial %  Volume change overall %  (Blank rows = not tested)   LYMPHEDEMA LIFE IMPACT SCALE (LLIS): 41.18%  (The extent to which LE-related problems affected your life in the past week.)  FOTO Outcome Measure: 58%   TODAY'S TREATMENT:                                                                                                                                         DATE:   PATIENT EDUCATION:  Reviewed progress towards all goals to date. Person educated: Patient Education method: Explanation, Demonstration, Handouts, and needs additional edu Education comprehension: verbalized understanding, returned demonstration, and needs further education  LYMPHEDEMA SELF-CARE HOME PROGRAM 1.Therapeutic lymphatic pumping therex- 2 sets of 10 reps, hold 5 seconds; elements in order. 2 x daily PRN 2.  Full time, daily compression: Intensive Phase CDT: multilayer short stretch bandages from toes to popliteal using gradient techniques  Self-management Phase CDT: Attempt to use existing OTS, light duty adjustable leggings. Consider HOS devices  to limit fibrosis (Jobst Relax ccl 2) 3. Daily skin care to sustain optimal hydration. Wash bites, scratches, blisters, etc with mild soap and water , apply antibacterial first aide cream and a band aide. 4. Simple self-Manual lymphatic drainage (MLD) PRN.: 5 Pt will benefit from daily use of advanced sequential compression device- Flexitouch- during self-management phase of CDT to assist with regular lymphatic decongestion to limit progression as Pt has difficulty reaching  her distal legs and feet to perform simple self-MLD.Pt has used a basic Biotab device daily greater than 1 year with significant physical and sensory symptoms of lymphedema remaining.   ASSESSMENT:  CLINICAL IMPRESSION: Pt demonstrates progress towards all goals. Comparative limb volumetrics completed today in prep  for progress review next visit reveals 7.6% limb volume reduction below the knee in the RLE since commencing OT for CDT. This is excellent progress on limb reduction goal in first 9 visits when first 2 visits did not involve manual therapy. Reapplied gradient compression wraps as established. Pt states she is pleased with progress to date.   OBJECTIVE IMPAIRMENTS: decreased knowledge of condition, decreased knowledge of use of DME, decreased mobility, difficulty walking, decreased ROM, increased edema, obesity, and pain.   ACTIVITY LIMITATIONS: decreased standing and walking tolerance > 30 minutes,  carrying, lifting, bending, extended dependent sitting, squatting, stairs, transfers, lower body bathing, LB grooming, LB dressing, productive, leisure and social participation requiring walking, standing and/ or dependent sitting >30 minutes ( attending social and community activities)  PERSONAL FACTORS: Age, time since onset, and 3+ co morbidities: CHF, OA, HTN are also affecting patient's functional outcome.   REHAB POTENTIAL: Good- with daily CG assistance with compression wraps during Intensive CDT. Poor without assistance with multilayer wraps  EVALUATION COMPLEXITY: moderate   GOALS: Goals reviewed with patient? Yes  SHORT TERM GOALS: Target date: 4th OT  Rx visit   Pt will demonstrate understanding of lymphedema precautions and prevention strategies with modified independence using a printed reference to identify at least 5 precautions and discussing how s/he may implement them into daily life to reduce risk of progression with modified assistance. Baseline:Max A Goal status:08/21/22 GOAL MET  2.   With Max caregiver assistance Pt will be able to apply multilayer, knee length, compression wraps using gradient techniques to decrease limb volume, to limit infection risk, and to limit lymphedema progression.  Baseline:Max A Goal status: 08/07/22 GOAL MET w caregiver. 08/13/22 Goal exceeded with  Pt modified independent with wraps to knee.  LONG TERM GOALS: Target date: 10/27/22  Given this patient's Intake score of 58 /100% on the functional outcomes FOTO tool, patient will experience an increase in function of 3 points to improve basic and instrumental ADLs performance, including lymphedema self-care. (TBA at first OT Rx visit) Baseline: Dependent Goal status: 07/29/22: ONGOING  2.  Given this patient's Intake score of 41.18% on the Lymphedema Life Impact Scale (LLIS), patient will experience a reduction of at least 5% in her perceived level of functional impairment resulting from lymphedema to improve functional performance and quality of life (QOL). Baseline: Dependent Goal status: 07/29/22: ONGOING  3.  With modified assistance (extra time) Pt will be able to don and doff appropriate compression garments and/or devices using correct calibration  for ~ 20-30 mmHg  to control BLE lymphedema and to limit progression.  Baseline: Mod A Goal status: 07/29/22: ONGOING  4.  Pt will achieve at least a 10% volume reductions bilaterally below the knees to return limb to more typical size and shape, to limit infection risk and LE progression, to decrease pain, to improve function, and to improve body image and QOL. Baseline: Max A Goal status: 08/26/22: RLE: 7.6% volume reduction achieved in RLE measured on 9th visit.  5.  During Intensive phase CDT Pt will achieve at least 85% compliance with all lymphedema self-care home program components, including  daily skin care, multilayer , gradient compression wraps with daily changes, daily simple self  MLD and daily lymphatic pumping therex to achieve optimal clinical outcome and to habituate self care regime for optimal LE self-management over time. Baseline: Max A Goal status:07/29/22: ONGOING   PLAN:  PT FREQUENCY: 2x/week, and PRN  PT DURATION: 12 weeks  PLANNED INTERVENTIONS: Complete Decongestive Therapy (CDT) Therapeutic exercises,  Therapeutic activity, Patient/Family education, Self Care, DME instructions, Manual lymph drainage, Compression bandaging, and Manual therapy; fit with appropriate compression garments that control swelling and that Pt can don and doff          with extra time and/ or assistive devices; consider trial with advanced sequential pneumatic compression device (Flexitouch) trial which mimics proximal to distal MLD for optimal lymphatic return.  Multilayer compression bandaging below the knee to single leg at a time to limit falls risk. Utilizing one each 8, 10 and 12 cm wide x 5 meters long, short stretch   bandages applied in staggered gradient fashion over single layer of .04 cm thick Rosidal foam applied circumferentially  over cotton stockinett from toes to popliteal fossa.  MLD to RLE/RLQ utilizing modified short neck sequence utilizing strokes to clavicles only. ( NO STROKES TO LATERAL NECK BILATERALLY)  diaphragmatic breathing to activate deep abdominal lymphatics and thoracic duct, functional inguinal LNs, and proximal to distal J strokes to thigh, knee, leg, ankle and foot, then retrograde sweep to finish w/ modified short neck sequence.  PLAN FOR NEXT SESSION:  MLD to RLE w simultaneous skin care Knee length multilayer compression wraps Cont Pt edu for LE self  care  Andrey Spearman, MS, OTR/L, CLT-LANA 08/29/22 9:08 AM

## 2022-09-02 ENCOUNTER — Ambulatory Visit: Payer: Medicare Other | Admitting: Occupational Therapy

## 2022-09-02 DIAGNOSIS — I890 Lymphedema, not elsewhere classified: Secondary | ICD-10-CM | POA: Diagnosis not present

## 2022-09-02 NOTE — Therapy (Signed)
OUTPATIENT OCCUPATIONAL THERAPY TREATMENT NOTE LOWER EXTREMITY LYMPHEDEMA  Patient Name: Isidra Sarpong MRN: [id number] DOB:July 15, 1931, 89 y.o., female Today's Date: 09/02/2022  REPORTING PERIOD:    END OF SESSION:  OT End of Session - 09/02/22 1459     Visit Number 11    Number of Visits 36    Date for OT Re-Evaluation 10/27/22    OT Start Time 1000    OT Stop Time 1100    OT Time Calculation (min) 60 min    Activity Tolerance Patient tolerated treatment well;No increased pain    Behavior During Therapy WFL for tasks assessed/performed                    Past Medical History:  Diagnosis Date   Hypertension    Lymphedema of both lower extremities    Thyroid disease    No past surgical history on file. Patient Active Problem List   Diagnosis Date Noted   Bradycardia 01/16/2021   Chronic diastolic CHF (congestive heart failure), NYHA class 2 ([place]) 04/06/2020   Leg numbness 03/20/2020   Leg swelling 03/20/2020   Lymphedema of both lower extremities 03/20/2020   Anosmia 03/06/2020   Bilateral carotid artery stenosis 03/06/2020   Chronic cough 03/06/2020   Dyspnea on exertion 03/06/2020   GERD (gastroesophageal reflux disease) 03/06/2020   Hyperlipidemia 03/06/2020   Lymphedema 03/06/2020   Migraines 03/06/2020   Osteoarthritis of both knees 03/06/2020   Idiopathic peripheral neuropathy 12/29/2013   Localized, primary osteoarthritis 12/28/2013   Osteoarthritis of knee 12/28/2013   Essential hypertension 10/23/2010   Family history of stroke 10/22/2010    PCP: Gladstone Lighter, MD  REFERRING PROVIDER: Eulogio Ditch, NP  REFERRING DIAG: I89.0  THERAPY DIAG:  Lymphedema, not elsewhere classified  ONSET DATE: >10 yrs  SUBJECTIVE:                                                                                                                                                                                           SUBJECTIVE STATEMENT: [name]  presents for Occupational Therapy for BLE lymphedema. Pt is unaccompanied today. Pt reports 5/10 OA knee pain in R knee w weight bearing. She denies LE-related pain. Pt reports wrapping went well during visit interval. Pt able to complete transfer easier this morning. She has her walker with her instead of using transport wc. Compression wraps in place.  PERTINENT HISTORY: CHF, chronic , progressive lymphedema BLE, thyroid disease, Bradycardia, B knee OA, Periferal neuropathy, B carotid stenosis  PAIN:  Are you having pain? Yes: NPRS scale: 5/10 Pain location: R knee Pain description: sore, tender, heavy, tight, achy  Aggravating factors: weight bearing Relieving factors: sitting, elevation, compression  PRECAUTIONS: Other: THYROID, B CAROTID ARTERY STENOSIS (NO strokes to lateral neck!) , CHF, BRADYCARDIA  FALLS:  Has patient fallen since last visit? No  HAND DOMINANCE: right   PRIOR LEVEL OF FUNCTION: Independent with basic ADLs, Independent with household mobility with device, Independent with community mobility with device, and Needs assistance with homemaking  PATIENT GOALS: "Because of the swelling, and I want to walk more. [name] gotten lazy about my walking.When I go to the vein doctor they wrapped my legs a couple of times, but I don't see them getting better."   OBJECTIVE:  OBSERVATIONS / OTHER ASSESSMENTS:   BLE COMPARATIVE LIMB VOLUMETRICS   LANDMARK  RIGHT  08/26/22 (9th visit)  R LEG (A-D) 6997.3 ml  R THIGH (E-G)   R FULL LIMB (A-G)   Limb Volume differential (LVD)    Volume change since initial Decreased 7.6%  Volume change overall   (Blank rows = not tested)  LANDMARK  INITIAL RIGHT  07/31/22  R LEG (A-D) 7570.2 ml  R THIGH (E-G)   R FULL LIMB (A-G)   Limb Volume differential (LVD)    Volume change since initial %  Volume change overall   (Blank rows = not tested)  LANDMARK INITIAL LEFT  07/31/22  R LEG (A-D) 7224.4 ml  R THIGH (E-G) ml  R FULL LIMB (A-G)  ml  Limb Volume differential (LVD)  4.6%, L leg  > R leg  Volume change since initial %  Volume change overall %  (Blank rows = not tested)   LYMPHEDEMA LIFE IMPACT SCALE (LLIS): 41.18%  (The extent to which LE-related problems affected your life in the past week.)  FOTO Outcome Measure: 58%   TODAY'S TREATMENT:                                                                                                                                         DATE:   MLD to RLE/RLQ utilizing functional inguinal watershed and seep abdominal lymphatics , as established. Multilayer compression wraps to R leg below knee as established PATIENT EDUCATION:  Reviewed progress towards all goals to date. Person educated: Patient Education method: Explanation, Demonstration, Handouts, and needs additional edu Education comprehension: verbalized understanding, returned demonstration, and needs further education  LYMPHEDEMA SELF-CARE HOME PROGRAM 1.Therapeutic lymphatic pumping therex- 2 sets of 10 reps, hold 5 seconds; elements in order. 2 x daily PRN 2.  Full time, daily compression: Intensive Phase CDT: multilayer short stretch bandages from toes to popliteal using gradient techniques  Self-management Phase CDT: Attempt to use existing OTS, light duty adjustable leggings. Consider HOS devices  to limit fibrosis (Jobst Relax ccl 2) 3. Daily skin care to sustain optimal hydration. Wash bites, scratches, blisters, etc with mild soap and water , apply antibacterial first aide cream and a band aide. 4. Simple self-Manual lymphatic drainage (  MLD) PRN.: 5 Pt will benefit from daily use of advanced sequential compression device- Flexitouch- during self-management phase of CDT to assist with regular lymphatic decongestion to limit progression as Pt has difficulty reaching her distal legs and feet to perform simple self-MLD.Pt has used a basic Biotab device daily greater than 1 year with significant physical and sensory  symptoms of lymphedema remaining.   ASSESSMENT:  CLINICAL IMPRESSION: Pt demonstrates progress towards all goals. Pt tolerated MLD without increased pain. Limb volume reduction appears to be slowed, but ongoing.Cont as per POC. Good compliance at home.   OBJECTIVE IMPAIRMENTS: decreased knowledge of condition, decreased knowledge of use of DME, decreased mobility, difficulty walking, decreased ROM, increased edema, obesity, and pain.   ACTIVITY LIMITATIONS: decreased standing and walking tolerance > 30 minutes,  carrying, lifting, bending, extended dependent sitting, squatting, stairs, transfers, lower body bathing, LB grooming, LB dressing, productive, leisure and social participation requiring walking, standing and/ or dependent sitting >30 minutes ( attending social and community activities)  PERSONAL FACTORS: Age, time since onset, and 3+ co morbidities: CHF, OA, HTN are also affecting patient's functional outcome.   REHAB POTENTIAL: Good- with daily CG assistance with compression wraps during Intensive CDT. Poor without assistance with multilayer wraps  EVALUATION COMPLEXITY: moderate   GOALS: Goals reviewed with patient? Yes  SHORT TERM GOALS: Target date: 4th OT  Rx visit   Pt will demonstrate understanding of lymphedema precautions and prevention strategies with modified independence using a printed reference to identify at least 5 precautions and discussing how s/he may implement them into daily life to reduce risk of progression with modified assistance. Baseline:Max A Goal status:08/21/22 GOAL MET  2.   With Max caregiver assistance Pt will be able to apply multilayer, knee length, compression wraps using gradient techniques to decrease limb volume, to limit infection risk, and to limit lymphedema progression.  Baseline:Max A Goal status: 08/07/22 GOAL MET w caregiver. 08/13/22 Goal exceeded with Pt modified independent with wraps to knee.  LONG TERM GOALS: Target date:  10/27/22  Given this patient's Intake score of 58 /100% on the functional outcomes FOTO tool, patient will experience an increase in function of 3 points to improve basic and instrumental ADLs performance, including lymphedema self-care. (TBA at first OT Rx visit) Baseline: Dependent Goal status: 07/29/22: ONGOING  2.  Given this patient's Intake score of 41.18% on the Lymphedema Life Impact Scale (LLIS), patient will experience a reduction of at least 5% in her perceived level of functional impairment resulting from lymphedema to improve functional performance and quality of life (QOL). Baseline: Dependent Goal status: 07/29/22: ONGOING  3.  With modified assistance (extra time) Pt will be able to don and doff appropriate compression garments and/or devices using correct calibration  for ~ 20-30 mmHg  to control BLE lymphedema and to limit progression.  Baseline: Mod A Goal status: 07/29/22: ONGOING  4.  Pt will achieve at least a 10% volume reductions bilaterally below the knees to return limb to more typical size and shape, to limit infection risk and LE progression, to decrease pain, to improve function, and to improve body image and QOL. Baseline: Max A Goal status: 08/26/22: RLE: 7.6% volume reduction achieved in RLE measured on 9th visit.  5.  During Intensive phase CDT Pt will achieve at least 85% compliance with all lymphedema self-care home program components, including  daily skin care, multilayer , gradient compression wraps with daily changes, daily simple self MLD and daily lymphatic pumping therex to achieve  optimal clinical outcome and to habituate self care regime for optimal LE self-management over time. Baseline: Max A Goal status:07/29/22: ONGOING   PLAN:  PT FREQUENCY: 2x/week, and PRN  PT DURATION: 12 weeks  PLANNED INTERVENTIONS: Complete Decongestive Therapy (CDT) Therapeutic exercises, Therapeutic activity, Patient/Family education, Self Care, DME instructions, Manual  lymph drainage, Compression bandaging, and Manual therapy; fit with appropriate compression garments that control swelling and that Pt can don and doff          with extra time and/ or assistive devices; consider trial with advanced sequential pneumatic compression device (Flexitouch) trial which mimics proximal to distal MLD for optimal lymphatic return.  Multilayer compression bandaging below the knee to single leg at a time to limit falls risk. Utilizing one each 8, 10 and 12 cm wide x 5 meters long, short stretch   bandages applied in staggered gradient fashion over single layer of .04 cm thick Rosidal foam applied circumferentially  over cotton stockinett from toes to popliteal fossa.  MLD to RLE/RLQ utilizing modified short neck sequence utilizing strokes to clavicles only. ( NO STROKES TO LATERAL NECK BILATERALLY)  diaphragmatic breathing to activate deep abdominal lymphatics and thoracic duct, functional inguinal LNs, and proximal to distal J strokes to thigh, knee, leg, ankle and foot, then retrograde sweep to finish w/ modified short neck sequence.  PLAN FOR NEXT SESSION:  MLD to RLE w simultaneous skin care Knee length multilayer compression wraps Cont Pt edu for LE self  care  Andrey Spearman, MS, OTR/L, CLT-LANA 09/02/22 3:00 PM [place]

## 2022-09-04 ENCOUNTER — Ambulatory Visit: Payer: Medicare Other | Admitting: Occupational Therapy

## 2022-09-04 DIAGNOSIS — I890 Lymphedema, not elsewhere classified: Secondary | ICD-10-CM | POA: Diagnosis not present

## 2022-09-04 NOTE — Therapy (Signed)
OUTPATIENT OCCUPATIONAL THERAPY TREATMENT NOTE LOWER EXTREMITY LYMPHEDEMA  Patient Name: Maraiya Bernath MRN: [id number] DOB:1932-06-13, 89 y.o., female Today's Date: 09/04/2022  REPORTING PERIOD:    END OF SESSION:  OT End of Session - 09/04/22 0914     Visit Number 12    Number of Visits 36    Date for OT Re-Evaluation 10/27/22    OT Start Time 0905    OT Stop Time 1005    OT Time Calculation (min) 60 min    Activity Tolerance Patient tolerated treatment well;No increased pain    Behavior During Therapy WFL for tasks assessed/performed                    Past Medical History:  Diagnosis Date   Hypertension    Lymphedema of both lower extremities    Thyroid disease    No past surgical history on file. Patient Active Problem List   Diagnosis Date Noted   Bradycardia 01/16/2021   Chronic diastolic CHF (congestive heart failure), NYHA class 2 ([place]) 04/06/2020   Leg numbness 03/20/2020   Leg swelling 03/20/2020   Lymphedema of both lower extremities 03/20/2020   Anosmia 03/06/2020   Bilateral carotid artery stenosis 03/06/2020   Chronic cough 03/06/2020   Dyspnea on exertion 03/06/2020   GERD (gastroesophageal reflux disease) 03/06/2020   Hyperlipidemia 03/06/2020   Lymphedema 03/06/2020   Migraines 03/06/2020   Osteoarthritis of both knees 03/06/2020   Idiopathic peripheral neuropathy 12/29/2013   Localized, primary osteoarthritis 12/28/2013   Osteoarthritis of knee 12/28/2013   Essential hypertension 10/23/2010   Family history of stroke 10/22/2010    PCP: Gladstone Lighter, MD  REFERRING PROVIDER: Eulogio Ditch, NP  REFERRING DIAG: I89.0  THERAPY DIAG:  Lymphedema, not elsewhere classified  ONSET DATE: >10 yrs  SUBJECTIVE:                                                                                                                                                                                           SUBJECTIVE STATEMENT: [name]  presents for Occupational Therapy for BLE lymphedema. Pt is unaccompanied today. Pt reports 2/10 OA knee pain in R knee w weight bearing. She denies LE-related pain. Pt reports 8 am appointments are difficult for her.   PERTINENT HISTORY: CHF, chronic , progressive lymphedema BLE, thyroid disease, Bradycardia, B knee OA, Periferal neuropathy, B carotid stenosis  PAIN:  Are you having pain? Yes: NPRS scale: 5/10 Pain location: R knee Pain description: sore, tender, heavy, tight, achy Aggravating factors: weight bearing Relieving factors: sitting, elevation, compression  PRECAUTIONS: Other: THYROID, B CAROTID ARTERY STENOSIS (NO strokes to lateral  neck!) , CHF, BRADYCARDIA  FALLS:  Has patient fallen since last visit? No  HAND DOMINANCE: right   PRIOR LEVEL OF FUNCTION: Independent with basic ADLs, Independent with household mobility with device, Independent with community mobility with device, and Needs assistance with homemaking  PATIENT GOALS: "Because of the swelling, and I want to walk more. [name] gotten lazy about my walking.When I go to the vein doctor they wrapped my legs a couple of times, but I don't see them getting better."   OBJECTIVE:  OBSERVATIONS / OTHER ASSESSMENTS:   BLE COMPARATIVE LIMB VOLUMETRICS   LANDMARK  RIGHT  08/26/22 (9th visit)  R LEG (A-D) 6997.3 ml  R THIGH (E-G)   R FULL LIMB (A-G)   Limb Volume differential (LVD)    Volume change since initial Decreased 7.6%  Volume change overall   (Blank rows = not tested)  LANDMARK  INITIAL RIGHT  07/31/22  R LEG (A-D) 7570.2 ml  R THIGH (E-G)   R FULL LIMB (A-G)   Limb Volume differential (LVD)    Volume change since initial %  Volume change overall   (Blank rows = not tested)  LANDMARK INITIAL LEFT  07/31/22  R LEG (A-D) 7224.4 ml  R THIGH (E-G) ml  R FULL LIMB (A-G) ml  Limb Volume differential (LVD)  4.6%, L leg  > R leg  Volume change since initial %  Volume change overall %  (Blank rows = not  tested)   LYMPHEDEMA LIFE IMPACT SCALE (LLIS): 41.18%  (The extent to which LE-related problems affected your life in the past week.)  FOTO Outcome Measure: 58%   TODAY'S TREATMENT:                                                                                                                                         DATE:   MLD to RLE/RLQ utilizing functional inguinal watershed and seep abdominal lymphatics , as established. Multilayer compression wraps to R leg below knee as established PATIENT EDUCATION:  Continued Pt edu for LE self care home program Person educated: Patient Education method: Explanation, Demonstration, Handouts, and needs additional edu Education comprehension: verbalized understanding, returned demonstration, and needs further education  LYMPHEDEMA SELF-CARE HOME PROGRAM 1.Therapeutic lymphatic pumping therex- 2 sets of 10 reps, hold 5 seconds; elements in order. 2 x daily PRN 2.  Full time, daily compression: Intensive Phase CDT: multilayer short stretch bandages from toes to popliteal using gradient techniques  Self-management Phase CDT: Attempt to use existing OTS, light duty adjustable leggings. Consider HOS devices  to limit fibrosis (Jobst Relax ccl 2) 3. Daily skin care to sustain optimal hydration. Wash bites, scratches, blisters, etc with mild soap and water , apply antibacterial first aide cream and a band aide. 4. Simple self-Manual lymphatic drainage (MLD) PRN.: 5 Pt will benefit from daily use of advanced sequential compression device- Flexitouch- during self-management phase of  CDT to assist with regular lymphatic decongestion to limit progression as Pt has difficulty reaching her distal legs and feet to perform simple self-MLD.Pt has used a basic Biotab device daily greater than 1 year with significant physical and sensory symptoms of lymphedema remaining.   ASSESSMENT:  CLINICAL IMPRESSION: Pt demonstrates progress towards all OT goals for  lymphedema care.goals. Pt tolerated MLD without increased pain. Limb volume in RLE continues to fluctuate a visibly noticeable amount between visits. This appears to be related more to systemic fluid retention than to lymphedema at present . Swelling reduction in RLE below the knee appears to be slowed, but ongoing.Cont as per POC. Suggest Pt monitor weight daily for the next few weeks at next visit.   OBJECTIVE IMPAIRMENTS: decreased knowledge of condition, decreased knowledge of use of DME, decreased mobility, difficulty walking, decreased ROM, increased edema, obesity, and pain.   ACTIVITY LIMITATIONS: decreased standing and walking tolerance > 30 minutes,  carrying, lifting, bending, extended dependent sitting, squatting, stairs, transfers, lower body bathing, LB grooming, LB dressing, productive, leisure and social participation requiring walking, standing and/ or dependent sitting >30 minutes ( attending social and community activities)  PERSONAL FACTORS: Age, time since onset, and 3+ co morbidities: CHF, OA, HTN are also affecting patient's functional outcome.   REHAB POTENTIAL: Good- with daily CG assistance with compression wraps during Intensive CDT. Poor without assistance with multilayer wraps  EVALUATION COMPLEXITY: moderate   GOALS: Goals reviewed with patient? Yes  SHORT TERM GOALS: Target date: 4th OT  Rx visit   Pt will demonstrate understanding of lymphedema precautions and prevention strategies with modified independence using a printed reference to identify at least 5 precautions and discussing how s/he may implement them into daily life to reduce risk of progression with modified assistance. Baseline:Max A Goal status:08/21/22 GOAL MET  2.   With Max caregiver assistance Pt will be able to apply multilayer, knee length, compression wraps using gradient techniques to decrease limb volume, to limit infection risk, and to limit lymphedema progression.  Baseline:Max A Goal  status: 08/07/22 GOAL MET w caregiver. 08/13/22 Goal exceeded with Pt modified independent with wraps to knee.  LONG TERM GOALS: Target date: 10/27/22  Given this patient's Intake score of 58 /100% on the functional outcomes FOTO tool, patient will experience an increase in function of 3 points to improve basic and instrumental ADLs performance, including lymphedema self-care. (TBA at first OT Rx visit) Baseline: Dependent Goal status: 07/29/22: ONGOING  2.  Given this patient's Intake score of 41.18% on the Lymphedema Life Impact Scale (LLIS), patient will experience a reduction of at least 5% in her perceived level of functional impairment resulting from lymphedema to improve functional performance and quality of life (QOL). Baseline: Dependent Goal status: 07/29/22: ONGOING  3.  With modified assistance (extra time) Pt will be able to don and doff appropriate compression garments and/or devices using correct calibration  for ~ 20-30 mmHg  to control BLE lymphedema and to limit progression.  Baseline: Mod A Goal status: 07/29/22: ONGOING  4.  Pt will achieve at least a 10% volume reductions bilaterally below the knees to return limb to more typical size and shape, to limit infection risk and LE progression, to decrease pain, to improve function, and to improve body image and QOL. Baseline: Max A Goal status: 08/26/22: RLE: 7.6% volume reduction achieved in RLE measured on 9th visit.  5.  During Intensive phase CDT Pt will achieve at least 85% compliance with all lymphedema self-care  home program components, including  daily skin care, multilayer , gradient compression wraps with daily changes, daily simple self MLD and daily lymphatic pumping therex to achieve optimal clinical outcome and to habituate self care regime for optimal LE self-management over time. Baseline: Max A Goal status:07/29/22: ONGOING   PLAN:  PT FREQUENCY: 2x/week, and PRN  PT DURATION: 12 weeks  PLANNED INTERVENTIONS:  Complete Decongestive Therapy (CDT) Therapeutic exercises, Therapeutic activity, Patient/Family education, Self Care, DME instructions, Manual lymph drainage, Compression bandaging, and Manual therapy; fit with appropriate compression garments that control swelling and that Pt can don and doff          with extra time and/ or assistive devices; consider trial with advanced sequential pneumatic compression device (Flexitouch) trial which mimics proximal to distal MLD for optimal lymphatic return.  Multilayer compression bandaging below the knee to single leg at a time to limit falls risk. Utilizing one each 8, 10 and 12 cm wide x 5 meters long, short stretch   bandages applied in staggered gradient fashion over single layer of .04 cm thick Rosidal foam applied circumferentially  over cotton stockinett from toes to popliteal fossa.  MLD to RLE/RLQ utilizing modified short neck sequence utilizing strokes to clavicles only. ( NO STROKES TO LATERAL NECK BILATERALLY)  diaphragmatic breathing to activate deep abdominal lymphatics and thoracic duct, functional inguinal LNs, and proximal to distal J strokes to thigh, knee, leg, ankle and foot, then retrograde sweep to finish w/ modified short neck sequence.  PLAN FOR NEXT SESSION:  MLD to RLE w simultaneous skin care Knee length multilayer compression wraps Cont Pt edu for LE self  care  Andrey Spearman, MS, OTR/L, CLT-LANA 09/04/22 12:46 PM [place]

## 2022-09-09 ENCOUNTER — Ambulatory Visit: Payer: Medicare Other | Attending: Nurse Practitioner | Admitting: Occupational Therapy

## 2022-09-09 ENCOUNTER — Encounter: Payer: Self-pay | Admitting: Occupational Therapy

## 2022-09-09 DIAGNOSIS — I890 Lymphedema, not elsewhere classified: Secondary | ICD-10-CM | POA: Diagnosis present

## 2022-09-09 NOTE — Therapy (Signed)
OUTPATIENT OCCUPATIONAL THERAPY TREATMENT NOTE LOWER EXTREMITY LYMPHEDEMA  Patient Name: Latoya Sloan MRN: [id number] DOB:11-28-1931, 89 y.o., female Today's Date: 09/09/2022  REPORTING PERIOD:    END OF SESSION:  OT End of Session - 09/09/22 1011     Visit Number 13    Number of Visits 36    Date for OT Re-Evaluation 10/27/22    OT Start Time 1005    OT Stop Time 1115    OT Time Calculation (min) 70 min    Activity Tolerance Patient tolerated treatment well;No increased pain    Behavior During Therapy WFL for tasks assessed/performed                    Past Medical History:  Diagnosis Date   Hypertension    Lymphedema of both lower extremities    Thyroid disease    History reviewed. No pertinent surgical history. Patient Active Problem List   Diagnosis Date Noted   Bradycardia 01/16/2021   Chronic diastolic CHF (congestive heart failure), NYHA class 2 ([place]) 04/06/2020   Leg numbness 03/20/2020   Leg swelling 03/20/2020   Lymphedema of both lower extremities 03/20/2020   Anosmia 03/06/2020   Bilateral carotid artery stenosis 03/06/2020   Chronic cough 03/06/2020   Dyspnea on exertion 03/06/2020   GERD (gastroesophageal reflux disease) 03/06/2020   Hyperlipidemia 03/06/2020   Lymphedema 03/06/2020   Migraines 03/06/2020   Osteoarthritis of both knees 03/06/2020   Idiopathic peripheral neuropathy 12/29/2013   Localized, primary osteoarthritis 12/28/2013   Osteoarthritis of knee 12/28/2013   Essential hypertension 10/23/2010   Family history of stroke 10/22/2010    PCP: Gladstone Lighter, MD  REFERRING PROVIDER: Eulogio Ditch, NP  REFERRING DIAG: I89.0  THERAPY DIAG:  Lymphedema, not elsewhere classified  ONSET DATE: >10 yrs  SUBJECTIVE:                                                                                                                                                                                           SUBJECTIVE STATEMENT: Latoya Sloan presents for Occupational Therapy for BLE lymphedema. Pt is unaccompanied today. Pt reports 2/10 OA knee pain in R knee w weight bearing. She denies LE-related pain. Pt reports compression bandaging between visits is going well. She states, "I don't feel the greatest today." Pt is sleepy at times during session.  PERTINENT HISTORY: CHF, chronic , progressive lymphedema BLE, thyroid disease, Bradycardia, B knee OA, Periferal neuropathy, B carotid stenosis  PAIN:  Are you having pain? Yes: NPRS scale: 5/10 Pain location: R knee Pain description: sore, tender, heavy, tight, achy Aggravating factors: weight bearing Relieving factors: sitting,  elevation, compression  PRECAUTIONS: Other: THYROID, B CAROTID ARTERY STENOSIS (NO strokes to lateral neck!) , CHF, BRADYCARDIA  FALLS:  Has patient fallen since last visit? No  HAND DOMINANCE: right   PRIOR LEVEL OF FUNCTION: Independent with basic ADLs, Independent with household mobility with device, Independent with community mobility with device, and Needs assistance with homemaking  PATIENT GOALS: "Because of the swelling, and I want to walk more. [name] gotten lazy about my walking.When I go to the vein doctor they wrapped my legs a couple of times, but I don't see them getting better."   OBJECTIVE:  OBSERVATIONS / OTHER ASSESSMENTS:   BLE COMPARATIVE LIMB VOLUMETRICS   LANDMARK  RIGHT  08/26/22 (9th visit)  R LEG (A-D) 6997.3 ml  R THIGH (E-G)   R FULL LIMB (A-G)   Limb Volume differential (LVD)    Volume change since initial Decreased 7.6%  Volume change overall   (Blank rows = not tested)  LANDMARK  INITIAL RIGHT  07/31/22  R LEG (A-D) 7570.2 ml  R THIGH (E-G)   R FULL LIMB (A-G)   Limb Volume differential (LVD)    Volume change since initial %  Volume change overall   (Blank rows = not tested)  LANDMARK INITIAL LEFT  07/31/22  R LEG (A-D) 7224.4 ml  R THIGH (E-G) ml  R FULL LIMB (A-G) ml  Limb Volume differential (LVD)   4.6%, L leg  > R leg  Volume change since initial %  Volume change overall %  (Blank rows = not tested)   LYMPHEDEMA LIFE IMPACT SCALE (LLIS): 41.18%  (The extent to which LE-related problems affected your life in the past week.)  FOTO Outcome Measure: 58%   TODAY'S TREATMENT:                                                                                                                                         DATE:   NO MLD today  in lieu of completing anatomical measurements for custom Circaid Juxtafit lower leggings with Velcro style closures. ( to RLE/RLQ utilizing functional inguinal watershed and seep abdominal lymphatics , as established.) Applied RLE Multilayer compression wraps to R leg below knee as established PATIENT EDUCATION:  Continued Pt edu for LE self care home program, Emphasis today on custom CircAids and ability to use card to calibrate compression, unlike;ike her existing Velcro style, off the shelf wraps. Person educated: Patient Education method: Explanation, Demonstration, Handouts, and needs additional edu Education comprehension: verbalized understanding, returned demonstration, and needs further education  LYMPHEDEMA SELF-CARE HOME PROGRAM 1.Therapeutic lymphatic pumping therex- 2 sets of 10 reps, hold 5 seconds; elements in order. 2 x daily PRN 2.  Full time, daily compression: Intensive Phase CDT: multilayer short stretch bandages from toes to popliteal using gradient techniques  Self-management Phase CDT: Attempt to use existing OTS, light duty adjustable leggings. Consider HOS devices  to  limit fibrosis (Jobst Relax ccl 2) 3. Daily skin care to sustain optimal hydration. Wash bites, scratches, blisters, etc with mild soap and water , apply antibacterial first aide cream and a band aide. 4. Simple self-Manual lymphatic drainage (MLD) PRN.: 5 Pt will benefit from daily use of advanced sequential compression device- Flexitouch- during self-management phase  of CDT to assist with regular lymphatic decongestion to limit progression as Pt has difficulty reaching her distal legs and feet to perform simple self-MLD.Pt has used a basic Biotab device daily greater than 1 year with significant physical and sensory symptoms of lymphedema remaining.   ASSESSMENT:  CLINICAL IMPRESSION: Pt educated re advantages and cons of alternative, wrap style compression leggings for daytime. Demonstrated example in clinic so Pt is able to see how this brand offer's correct fit and ability to calibrate compression, which her existing, flimsy wrap style alternatives do not provide. Existing leggings do not provide adequate compression to contriol edema and they are too short in length. The CircAid Juxtafit custom essentials are medically necessary to fit the size and shape of the patient's limb, to enable Pt to don and doff  daytime garments independently and calibrate correct compression for optimal LE management.Pt verbalized understanding of recommendations. Completed anatomical measurements of R leg during education activity. Applied multilayer knee length, gradient compression wraps as established. Once RLE garment is fitted we will commence CDT to LLE. Cont as per POC.   OBJECTIVE IMPAIRMENTS: decreased knowledge of condition, decreased knowledge of use of DME, decreased mobility, difficulty walking, decreased ROM, increased edema, obesity, and pain.   ACTIVITY LIMITATIONS: decreased standing and walking tolerance > 30 minutes,  carrying, lifting, bending, extended dependent sitting, squatting, stairs, transfers, lower body bathing, LB grooming, LB dressing, productive, leisure and social participation requiring walking, standing and/ or dependent sitting >30 minutes ( attending social and community activities)  PERSONAL FACTORS: Age, time since onset, and 3+ co morbidities: CHF, OA, HTN are also affecting patient's functional outcome.   REHAB POTENTIAL: Good- with daily CG  assistance with compression wraps during Intensive CDT. Poor without assistance with multilayer wraps  EVALUATION COMPLEXITY: moderate   GOALS: Goals reviewed with patient? Yes  SHORT TERM GOALS: Target date: 4th OT  Rx visit   Pt will demonstrate understanding of lymphedema precautions and prevention strategies with modified independence using a printed reference to identify at least 5 precautions and discussing how s/he may implement them into daily life to reduce risk of progression with modified assistance. Baseline:Max A Goal status:08/21/22 GOAL MET  2.   With Max caregiver assistance Pt will be able to apply multilayer, knee length, compression wraps using gradient techniques to decrease limb volume, to limit infection risk, and to limit lymphedema progression.  Baseline:Max A Goal status: 08/07/22 GOAL MET w caregiver. 08/13/22 Goal exceeded with Pt modified independent with wraps to knee.  LONG TERM GOALS: Target date: 10/27/22  Given this patient's Intake score of 58 /100% on the functional outcomes FOTO tool, patient will experience an increase in function of 3 points to improve basic and instrumental ADLs performance, including lymphedema self-care. (TBA at first OT Rx visit) Baseline: Dependent Goal status: 07/29/22: ONGOING  2.  Given this patient's Intake score of 41.18% on the Lymphedema Life Impact Scale (LLIS), patient will experience a reduction of at least 5% in her perceived level of functional impairment resulting from lymphedema to improve functional performance and quality of life (QOL). Baseline: Dependent Goal status: 07/29/22: ONGOING  3.  With modified assistance (  extra time) Pt will be able to don and doff appropriate compression garments and/or devices using correct calibration  for ~ 20-30 mmHg  to control BLE lymphedema and to limit progression.  Baseline: Mod A Goal status: 07/29/22: ONGOING  4.  Pt will achieve at least a 10% volume reductions bilaterally  below the knees to return limb to more typical size and shape, to limit infection risk and LE progression, to decrease pain, to improve function, and to improve body image and QOL. Baseline: Max A Goal status: 08/26/22: RLE: 7.6% volume reduction achieved in RLE measured on 9th visit.  5.  During Intensive phase CDT Pt will achieve at least 85% compliance with all lymphedema self-care home program components, including  daily skin care, multilayer , gradient compression wraps with daily changes, daily simple self MLD and daily lymphatic pumping therex to achieve optimal clinical outcome and to habituate self care regime for optimal LE self-management over time. Baseline: Max A Goal status:07/29/22: ONGOING   PLAN:  PT FREQUENCY: 2x/week, and PRN  PT DURATION: 12 weeks  PLANNED INTERVENTIONS: Complete Decongestive Therapy (CDT) Therapeutic exercises, Therapeutic activity, Patient/Family education, Self Care, DME instructions, Manual lymph drainage, Compression bandaging, and Manual therapy; fit with appropriate compression garments that control swelling and that Pt can don and doff          with extra time and/ or assistive devices; consider trial with advanced sequential pneumatic compression device (Flexitouch) trial which mimics proximal to distal MLD for optimal lymphatic return.  Multilayer compression bandaging below the knee to single leg at a time to limit falls risk. Utilizing one each 8, 10 and 12 cm wide x 5 meters long, short stretch   bandages applied in staggered gradient fashion over single layer of .04 cm thick Rosidal foam applied circumferentially  over cotton stockinett from toes to popliteal fossa.  MLD to RLE/RLQ utilizing modified short neck sequence utilizing strokes to clavicles only. ( NO STROKES TO LATERAL NECK BILATERALLY)  diaphragmatic breathing to activate deep abdominal lymphatics and thoracic duct, functional inguinal LNs, and proximal to distal J strokes to thigh,  knee, leg, ankle and foot, then retrograde sweep to finish w/ modified short neck sequence.  PLAN FOR NEXT SESSION:  MLD to RLE w simultaneous skin care Knee length multilayer compression wraps Cont Pt edu for LE self  care  Andrey Spearman, MS, OTR/L, CLT-LANA 09/09/22 12:41 PM

## 2022-09-11 ENCOUNTER — Ambulatory Visit: Payer: Medicare Other | Admitting: Occupational Therapy

## 2022-09-11 DIAGNOSIS — I890 Lymphedema, not elsewhere classified: Secondary | ICD-10-CM | POA: Diagnosis not present

## 2022-09-11 NOTE — Therapy (Signed)
OUTPATIENT OCCUPATIONAL THERAPY TREATMENT NOTE LOWER EXTREMITY LYMPHEDEMA  Patient Name: Latoya Sloan MRN: [id number] DOB:1931/07/26, 89 y.o., female Today's Date: 09/11/2022  REPORTING PERIOD:    END OF SESSION:  OT End of Session - 09/11/22 1110     Visit Number 14    Number of Visits 36    Date for OT Re-Evaluation 10/27/22    OT Start Time 1104    OT Stop Time [id number]    OT Time Calculation (min) 60 min    Activity Tolerance Patient tolerated treatment well;No increased pain    Behavior During Therapy WFL for tasks assessed/performed                    Past Medical History:  Diagnosis Date   Hypertension    Lymphedema of both lower extremities    Thyroid disease    No past surgical history on file. Patient Active Problem List   Diagnosis Date Noted   Bradycardia 01/16/2021   Chronic diastolic CHF (congestive heart failure), NYHA class 2 ([place]) 04/06/2020   Leg numbness 03/20/2020   Leg swelling 03/20/2020   Lymphedema of both lower extremities 03/20/2020   Anosmia 03/06/2020   Bilateral carotid artery stenosis 03/06/2020   Chronic cough 03/06/2020   Dyspnea on exertion 03/06/2020   GERD (gastroesophageal reflux disease) 03/06/2020   Hyperlipidemia 03/06/2020   Lymphedema 03/06/2020   Migraines 03/06/2020   Osteoarthritis of both knees 03/06/2020   Idiopathic peripheral neuropathy 12/29/2013   Localized, primary osteoarthritis 12/28/2013   Osteoarthritis of knee 12/28/2013   Essential hypertension 10/23/2010   Family history of stroke 10/22/2010    PCP: Gladstone Lighter, MD  REFERRING PROVIDER: Eulogio Ditch, NP  REFERRING DIAG: I89.0  THERAPY DIAG:  Lymphedema, not elsewhere classified  ONSET DATE: >10 yrs  SUBJECTIVE:                                                                                                                                                                                           SUBJECTIVE STATEMENT: Latoya Sloan presents  for Occupational Therapy for BLE lymphedema. Pt is unaccompanied today. Pt reports 2/10 OA knee pain in R knee w weight bearing. She denies LE-related pain. Pt reports compression bandaging between visits is going well. Pt is sleepy throughout session again today.  PERTINENT HISTORY: CHF, chronic , progressive lymphedema BLE, thyroid disease, Bradycardia, B knee OA, Periferal neuropathy, B carotid stenosis  PAIN:  Are you having pain? Yes: NPRS scale: 5/10 Pain location: R knee Pain description: sore, tender, heavy, tight, achy Aggravating factors: weight bearing Relieving factors: sitting, elevation, compression  PRECAUTIONS: Other: THYROID, B CAROTID  ARTERY STENOSIS (NO strokes to lateral neck!) , CHF, BRADYCARDIA  FALLS:  Has patient fallen since last visit? No  HAND DOMINANCE: right   PRIOR LEVEL OF FUNCTION: Independent with basic ADLs, Independent with household mobility with device, Independent with community mobility with device, and Needs assistance with homemaking  PATIENT GOALS: "Because of the swelling, and I want to walk more. [name] gotten lazy about my walking.When I go to the vein doctor they wrapped my legs a couple of times, but I don't see them getting better."   OBJECTIVE:  OBSERVATIONS / OTHER ASSESSMENTS:   BLE COMPARATIVE LIMB VOLUMETRICS   LANDMARK  RIGHT  08/26/22 (9th visit)  R LEG (A-D) 6997.3 ml  R THIGH (E-G)   R FULL LIMB (A-G)   Limb Volume differential (LVD)    Volume change since initial Decreased 7.6%  Volume change overall   (Blank rows = not tested)  LANDMARK  INITIAL RIGHT  07/31/22  R LEG (A-D) 7570.2 ml  R THIGH (E-G)   R FULL LIMB (A-G)   Limb Volume differential (LVD)    Volume change since initial %  Volume change overall   (Blank rows = not tested)  LANDMARK INITIAL LEFT  07/31/22  R LEG (A-D) 7224.4 ml  R THIGH (E-G) ml  R FULL LIMB (A-G) ml  Limb Volume differential (LVD)  4.6%, L leg  > R leg  Volume change since initial %   Volume change overall %  (Blank rows = not tested)   LYMPHEDEMA LIFE IMPACT SCALE (LLIS): 41.18%  (The extent to which LE-related problems affected your life in the past week.)  FOTO Outcome Measure: 58%   TODAY'S TREATMENT:                                                                                                                                         DATE:   NO MLD today  in lieu of completing anatomical measurements for custom Circaid Juxtafit lower leggings with Velcro style closures. ( to RLE/RLQ utilizing functional inguinal watershed and seep abdominal lymphatics , as established.) Applied RLE Multilayer compression wraps to R leg below knee as established PATIENT EDUCATION:  Continued Pt edu for LE self care home program, Emphasis today on custom CircAids and ability to use card to calibrate compression, unlike;ike her existing Velcro style, off the shelf wraps. Person educated: Patient Education method: Explanation, Demonstration, Handouts, and needs additional edu Education comprehension: verbalized understanding, returned demonstration, and needs further education  LYMPHEDEMA SELF-CARE HOME PROGRAM 1.Therapeutic lymphatic pumping therex- 2 sets of 10 reps, hold 5 seconds; elements in order. 2 x daily PRN 2.  Full time, daily compression: Intensive Phase CDT: multilayer short stretch bandages from toes to popliteal using gradient techniques  Self-management Phase CDT: Attempt to use existing OTS, light duty adjustable leggings. Consider HOS devices  to limit fibrosis (Jobst Relax ccl 2) 3. Daily  skin care to sustain optimal hydration. Wash bites, scratches, blisters, etc with mild soap and water , apply antibacterial first aide cream and a band aide. 4. Simple self-Manual lymphatic drainage (MLD) PRN.: 5 Pt will benefit from daily use of advanced sequential compression device- Flexitouch- during self-management phase of CDT to assist with regular lymphatic decongestion to  limit progression as Pt has difficulty reaching her distal legs and feet to perform simple self-MLD.Pt has used a basic Biotab device daily greater than 1 year with significant physical and sensory symptoms of lymphedema remaining.   ASSESSMENT:  CLINICAL IMPRESSION: Cont MLD and gradient compression wraps to RLE today without c/o increased pain. Pt continues to demonstrate slow , but steady progress towards all OT goals for CDT. Custom compression leggings have been ordered. Complete fitting ASAP and commence LLE Rx. Cont as per POC.   OBJECTIVE IMPAIRMENTS: decreased knowledge of condition, decreased knowledge of use of DME, decreased mobility, difficulty walking, decreased ROM, increased edema, obesity, and pain.   ACTIVITY LIMITATIONS: decreased standing and walking tolerance > 30 minutes,  carrying, lifting, bending, extended dependent sitting, squatting, stairs, transfers, lower body bathing, LB grooming, LB dressing, productive, leisure and social participation requiring walking, standing and/ or dependent sitting >30 minutes ( attending social and community activities)  PERSONAL FACTORS: Age, time since onset, and 3+ co morbidities: CHF, OA, HTN are also affecting patient's functional outcome.   REHAB POTENTIAL: Good- with daily CG assistance with compression wraps during Intensive CDT. Poor without assistance with multilayer wraps  EVALUATION COMPLEXITY: moderate   GOALS: Goals reviewed with patient? Yes  SHORT TERM GOALS: Target date: 4th OT  Rx visit   Pt will demonstrate understanding of lymphedema precautions and prevention strategies with modified independence using a printed reference to identify at least 5 precautions and discussing how s/he may implement them into daily life to reduce risk of progression with modified assistance. Baseline:Max A Goal status:08/21/22 GOAL MET  2.   With Max caregiver assistance Pt will be able to apply multilayer, knee length, compression  wraps using gradient techniques to decrease limb volume, to limit infection risk, and to limit lymphedema progression.  Baseline:Max A Goal status: 08/07/22 GOAL MET w caregiver. 08/13/22 Goal exceeded with Pt modified independent with wraps to knee.  LONG TERM GOALS: Target date: 10/27/22  Given this patient's Intake score of 58 /100% on the functional outcomes FOTO tool, patient will experience an increase in function of 3 points to improve basic and instrumental ADLs performance, including lymphedema self-care. (TBA at first OT Rx visit) Baseline: Dependent Goal status: 07/29/22: ONGOING  2.  Given this patient's Intake score of 41.18% on the Lymphedema Life Impact Scale (LLIS), patient will experience a reduction of at least 5% in her perceived level of functional impairment resulting from lymphedema to improve functional performance and quality of life (QOL). Baseline: Dependent Goal status: 07/29/22: ONGOING  3.  With modified assistance (extra time) Pt will be able to don and doff appropriate compression garments and/or devices using correct calibration  for ~ 20-30 mmHg  to control BLE lymphedema and to limit progression.  Baseline: Mod A Goal status: 07/29/22: ONGOING  4.  Pt will achieve at least a 10% volume reductions bilaterally below the knees to return limb to more typical size and shape, to limit infection risk and LE progression, to decrease pain, to improve function, and to improve body image and QOL. Baseline: Max A Goal status: 08/26/22: RLE: 7.6% volume reduction achieved in RLE measured  on 9th visit.  5.  During Intensive phase CDT Pt will achieve at least 85% compliance with all lymphedema self-care home program components, including  daily skin care, multilayer , gradient compression wraps with daily changes, daily simple self MLD and daily lymphatic pumping therex to achieve optimal clinical outcome and to habituate self care regime for optimal LE self-management over  time. Baseline: Max A Goal status:07/29/22: ONGOING   PLAN:  PT FREQUENCY: 2x/week, and PRN  PT DURATION: 12 weeks  PLANNED INTERVENTIONS: Complete Decongestive Therapy (CDT) Therapeutic exercises, Therapeutic activity, Patient/Family education, Self Care, DME instructions, Manual lymph drainage, Compression bandaging, and Manual therapy; fit with appropriate compression garments that control swelling and that Pt can don and doff          with extra time and/ or assistive devices; consider trial with advanced sequential pneumatic compression device (Flexitouch) trial which mimics proximal to distal MLD for optimal lymphatic return.  Multilayer compression bandaging below the knee to single leg at a time to limit falls risk. Utilizing one each 8, 10 and 12 cm wide x 5 meters long, short stretch   bandages applied in staggered gradient fashion over single layer of .04 cm thick Rosidal foam applied circumferentially  over cotton stockinett from toes to popliteal fossa.  MLD to RLE/RLQ utilizing modified short neck sequence utilizing strokes to clavicles only. ( NO STROKES TO LATERAL NECK BILATERALLY)  diaphragmatic breathing to activate deep abdominal lymphatics and thoracic duct, functional inguinal LNs, and proximal to distal J strokes to thigh, knee, leg, ankle and foot, then retrograde sweep to finish w/ modified short neck sequence.  PLAN FOR NEXT SESSION:  MLD to RLE w simultaneous skin care Knee length multilayer compression wraps Cont Pt edu for LE self  care  Andrey Spearman, MS, OTR/L, CLT-LANA 09/11/22 1:04 PM [place]

## 2022-09-16 ENCOUNTER — Ambulatory Visit: Payer: Medicare Other | Admitting: Occupational Therapy

## 2022-09-16 DIAGNOSIS — I890 Lymphedema, not elsewhere classified: Secondary | ICD-10-CM

## 2022-09-17 ENCOUNTER — Encounter: Payer: Self-pay | Admitting: Occupational Therapy

## 2022-09-17 NOTE — Therapy (Signed)
OUTPATIENT OCCUPATIONAL THERAPY TREATMENT NOTE LOWER EXTREMITY LYMPHEDEMA  Patient Name: Latoya Sloan MRN: [id number] DOB:[birth year], 89 y.o., female Today's Date: 09/17/2022  REPORTING PERIOD:    END OF SESSION:  OT End of Session - 09/16/22 1347     Visit Number 15    Number of Visits 36    Date for OT Re-Evaluation 10/27/22    OT Start Time 1005    OT Stop Time 1105    OT Time Calculation (min) 60 min    Activity Tolerance Patient tolerated treatment well;No increased pain    Behavior During Therapy WFL for tasks assessed/performed                    Past Medical History:  Diagnosis Date   Hypertension    Lymphedema of both lower extremities    Thyroid disease    History reviewed. No pertinent surgical history. Patient Active Problem List   Diagnosis Date Noted   Bradycardia 01/16/2021   Chronic diastolic CHF (congestive heart failure), NYHA class 2 ([place]) 04/06/2020   Leg numbness 03/20/2020   Leg swelling 03/20/2020   Lymphedema of both lower extremities 03/20/2020   Anosmia 03/06/2020   Bilateral carotid artery stenosis 03/06/2020   Chronic cough 03/06/2020   Dyspnea on exertion 03/06/2020   GERD (gastroesophageal reflux disease) 03/06/2020   Hyperlipidemia 03/06/2020   Lymphedema 03/06/2020   Migraines 03/06/2020   Osteoarthritis of both knees 03/06/2020   Idiopathic peripheral neuropathy 12/29/2013   Localized, primary osteoarthritis 12/28/2013   Osteoarthritis of knee 12/28/2013   Essential hypertension 10/23/2010   Family history of stroke 10/22/2010    PCP: Gladstone Lighter, MD  REFERRING PROVIDER: Eulogio Ditch, NP  REFERRING DIAG: I89.0  THERAPY DIAG:  Lymphedema, not elsewhere classified  ONSET DATE: >10 yrs  SUBJECTIVE:                                                                                                                                                                                           SUBJECTIVE STATEMENT: Latoya Sloan presents for Occupational Therapy for BLE lymphedema. Pt is unaccompanied today. Pt reports 2/10 OA knee pain in R knee w weight bearing. She denies LE-related pain. Pt reports compression bandaging between visits is going well. Pt has no new complaints. PERTINENT HISTORY: CHF, chronic , progressive lymphedema BLE, thyroid disease, Bradycardia, B knee OA, Periferal neuropathy, B carotid stenosis  PAIN:  Are you having pain? Yes: NPRS scale: 2/10 Pain location: R knee Pain description: sore, tender, heavy, tight, achy Aggravating factors: weight bearing Relieving factors: sitting, elevation, compression  PRECAUTIONS: Other: THYROID, B CAROTID ARTERY STENOSIS (NO  strokes to lateral neck!) , CHF, BRADYCARDIA  FALLS:  Has patient fallen since last visit? No  HAND DOMINANCE: right   PRIOR LEVEL OF FUNCTION: Independent with basic ADLs, Independent with household mobility with device, Independent with community mobility with device, and Needs assistance with homemaking  PATIENT GOALS: "Because of the swelling, and I want to walk more. [name] gotten lazy about my walking.When I go to the vein doctor they wrapped my legs a couple of times, but I don't see them getting better."   OBJECTIVE:  OBSERVATIONS / OTHER ASSESSMENTS:   BLE COMPARATIVE LIMB VOLUMETRICS   LANDMARK  RIGHT  08/26/22 (9th visit)  R LEG (A-D) 6997.3 ml  R THIGH (E-G)   R FULL LIMB (A-G)   Limb Volume differential (LVD)    Volume change since initial Decreased 7.6%  Volume change overall   (Blank rows = not tested)  LANDMARK  INITIAL RIGHT  07/31/22  R LEG (A-D) 7570.2 ml  R THIGH (E-G)   R FULL LIMB (A-G)   Limb Volume differential (LVD)    Volume change since initial %  Volume change overall   (Blank rows = not tested)  LANDMARK INITIAL LEFT  07/31/22  R LEG (A-D) 7224.4 ml  R THIGH (E-G) ml  R FULL LIMB (A-G) ml  Limb Volume differential (LVD)  4.6%, L leg  > R leg  Volume change since initial %   Volume change overall %  (Blank rows = not tested)   LYMPHEDEMA LIFE IMPACT SCALE (LLIS): 41.18%  (The extent to which LE-related problems affected your life in the past week.)  FOTO Outcome Measure: 58%   TODAY'S TREATMENT:                                                                                                                                         DATE:   MLD to RLE/RLQ as established with simultaneous skin care utilizing functional inguinal watershed, deep abdominal lymphatics  and proximal to distal J strokes to entire limb Applied RLE Multilayer compression wraps to R leg below knee as established PATIENT EDUCATION:  Continued Pt edu for LE self care home program, Emphasis today on custom CircAids and ability to use card to calibrate compression, unlike;ike her existing Velcro style, off the shelf wraps. Person educated: Patient Education method: Explanation, Demonstration, Handouts, and needs additional edu Education comprehension: verbalized understanding, returned demonstration, and needs further education  LYMPHEDEMA SELF-CARE HOME PROGRAM 1.Therapeutic lymphatic pumping therex- 2 sets of 10 reps, hold 5 seconds; elements in order. 2 x daily PRN 2.  Full time, daily compression: Intensive Phase CDT: multilayer short stretch bandages from toes to popliteal using gradient techniques  Self-management Phase CDT: Attempt to use existing OTS, light duty adjustable leggings. Consider HOS devices  to limit fibrosis (Jobst Relax ccl 2) 3. Daily skin care to sustain optimal hydration. Wash bites, scratches, blisters, etc  with mild soap and water , apply antibacterial first aide cream and a band aide. 4. Simple self-Manual lymphatic drainage (MLD) PRN.: 5 Pt will benefit from daily use of advanced sequential compression device- Flexitouch- during self-management phase of CDT to assist with regular lymphatic decongestion to limit progression as Pt has difficulty reaching her distal  legs and feet to perform simple self-MLD.Pt has used a basic Biotab device daily greater than 1 year with significant physical and sensory symptoms of lymphedema remaining.  Lymphedema Self- Care Instructions   1. EXERCISE: Perform lymphatic pumping there ex at least 2 x a day while wearing your compression wraps or garments. Perform 10 reps of each exercise bilaterally and be sure to perform them in order. Don't skip around!  OMIT PARTIAL SIT UPs.  2. MLD: Perform simple self-manual lymphatic drainage (MLD) at least once a day as directed. Take your time! Breathe! ;-)  3. If you have a Flexitouch advanced "pump" use it 1 time each day on a single limb only. The Flexitouch moves lymphatic fluid out of your affected body part and back to your heart, so DO NOT use the Flexi on 2 legs at a time, and DO NOT cues it on 2 legs on the same day. If you experience any atypical shortness of breath, sudden onset of pain, or feelings of heart arhythmia, or racing, discontinue use of the Flexitouch and report these symptoms to your doctor right away. Also, discontinue Flexi if you have an infection or a fever. It's OK to resume using the device 72 hours AFTER your first dose of oral antibiotic.   4. 4. WRAPS: Compression wraps are to be worn 23 hrs/ 7 days/wk during Intensive Phase of Complete Decongestive Therapy (CDT).Building tolerance may take time and practice, so don't get discouraged. If bandages begin to feel tight during periods of inactivity and/or during the night, try performing your exercises to loosen them. Do not leave short stretch wraps in place for > 23 hours. It is very important that you remove all wraps daily to inspect skin, bathe and perform skin care before reapplying your wraps.  5. Daytime GARMENTS/ HOS DEVICES: During the Self-Management Phase CDT your compression garments are to be worn during waking hours when active. Do NOT sleep in your garments!!  Don daytime garments first thing in  the morning. Do not wear your HOS devices all day instead of garments. These will not contain your swelling.  6. PUT YOUR FEET UP! Elevate your feet and legs and feet to the level of your heart whenever you are sitting down.   7. SKIN: Carefully monitor skin condition and perform impeccable hygiene daily. Bathe skin with mild soap and water and apply low pH lotion (aka Eucerin ) to improve hydration and limit infection risk.    ASSESSMENT:  CLINICAL IMPRESSION: Cont MLD and gradient compression wraps to RLE today without c/o increased pain. Pt continues to demonstrate slow , but steady progress towards all OT goals for CDT. Custom compression leggings have been ordered. Complete fitting ASAP and commence LLE Rx. Tactile Flexitouch Plus trial scheduled 10/31/22. Cont as per POC.   OBJECTIVE IMPAIRMENTS: decreased knowledge of condition, decreased knowledge of use of DME, decreased mobility, difficulty walking, decreased ROM, increased edema, obesity, and pain.   ACTIVITY LIMITATIONS: decreased standing and walking tolerance > 30 minutes,  carrying, lifting, bending, extended dependent sitting, squatting, stairs, transfers, lower body bathing, LB grooming, LB dressing, productive, leisure and social participation requiring walking, standing and/ or  dependent sitting >30 minutes ( attending social and community activities)  PERSONAL FACTORS: Age, time since onset, and 3+ co morbidities: CHF, OA, HTN are also affecting patient's functional outcome.   REHAB POTENTIAL: Good- with daily CG assistance with compression wraps during Intensive CDT. Poor without assistance with multilayer wraps  EVALUATION COMPLEXITY: moderate   GOALS: Goals reviewed with patient? Yes  SHORT TERM GOALS: Target date: 4th OT  Rx visit   Pt will demonstrate understanding of lymphedema precautions and prevention strategies with modified independence using a printed reference to identify at least 5 precautions and  discussing how s/he may implement them into daily life to reduce risk of progression with modified assistance. Baseline:Max A Goal status:08/21/22 GOAL MET  2.   With Max caregiver assistance Pt will be able to apply multilayer, knee length, compression wraps using gradient techniques to decrease limb volume, to limit infection risk, and to limit lymphedema progression.  Baseline:Max A Goal status: 08/07/22 GOAL MET w caregiver. 08/13/22 Goal exceeded with Pt modified independent with wraps to knee.  LONG TERM GOALS: Target date: 10/27/22  Given this patient's Intake score of 58 /100% on the functional outcomes FOTO tool, patient will experience an increase in function of 3 points to improve basic and instrumental ADLs performance, including lymphedema self-care. (TBA at first OT Rx visit) Baseline: Dependent Goal status: 07/29/22: ONGOING  2.  Given this patient's Intake score of 41.18% on the Lymphedema Life Impact Scale (LLIS), patient will experience a reduction of at least 5% in her perceived level of functional impairment resulting from lymphedema to improve functional performance and quality of life (QOL). Baseline: Dependent Goal status: 07/29/22: ONGOING  3.  With modified assistance (extra time) Pt will be able to don and doff appropriate compression garments and/or devices using correct calibration  for ~ 20-30 mmHg  to control BLE lymphedema and to limit progression.  Baseline: Mod A Goal status: 07/29/22: ONGOING  4.  Pt will achieve at least a 10% volume reductions bilaterally below the knees to return limb to more typical size and shape, to limit infection risk and LE progression, to decrease pain, to improve function, and to improve body image and QOL. Baseline: Max A Goal status: 08/26/22: RLE: 7.6% volume reduction achieved in RLE measured on 9th visit.  5.  During Intensive phase CDT Pt will achieve at least 85% compliance with all lymphedema self-care home program components,  including  daily skin care, multilayer , gradient compression wraps with daily changes, daily simple self MLD and daily lymphatic pumping therex to achieve optimal clinical outcome and to habituate self care regime for optimal LE self-management over time. Baseline: Max A Goal status:07/29/22: ONGOING   PLAN:  PT FREQUENCY: 2x/week, and PRN  PT DURATION: 12 weeks  PLANNED INTERVENTIONS: Complete Decongestive Therapy (CDT) Therapeutic exercises, Therapeutic activity, Patient/Family education, Self Care, DME instructions, Manual lymph drainage, Compression bandaging, and Manual therapy; fit with appropriate compression garments that control swelling and that Pt can don and doff          with extra time and/ or assistive devices; consider trial with advanced sequential pneumatic compression device (Flexitouch) trial which mimics proximal to distal MLD for optimal lymphatic return.  Multilayer compression bandaging below the knee to single leg at a time to limit falls risk. Utilizing one each 8, 10 and 12 cm wide x 5 meters long, short stretch   bandages applied in staggered gradient fashion over single layer of .04 cm thick Rosidal foam applied  circumferentially  over cotton stockinett from toes to popliteal fossa.  MLD to RLE/RLQ utilizing modified short neck sequence utilizing strokes to clavicles only. ( NO STROKES TO LATERAL NECK BILATERALLY)  diaphragmatic breathing to activate deep abdominal lymphatics and thoracic duct, functional inguinal LNs, and proximal to distal J strokes to thigh, knee, leg, ankle and foot, then retrograde sweep to finish w/ modified short neck sequence.  PLAN FOR NEXT SESSION:  MLD to RLE w simultaneous skin care Knee length multilayer compression wraps Cont Pt edu for LE self  care  Andrey Spearman, MS, OTR/L, CLT-LANA 09/17/22 1:48 PM [place]

## 2022-09-18 ENCOUNTER — Ambulatory Visit: Payer: Medicare Other | Admitting: Occupational Therapy

## 2022-09-19 ENCOUNTER — Ambulatory Visit: Payer: Medicare Other | Admitting: Occupational Therapy

## 2022-09-23 ENCOUNTER — Ambulatory Visit: Payer: Medicare Other | Admitting: Occupational Therapy

## 2022-09-25 ENCOUNTER — Ambulatory Visit: Payer: Medicare Other | Admitting: Occupational Therapy

## 2022-09-28 ENCOUNTER — Other Ambulatory Visit: Payer: Self-pay

## 2022-09-28 ENCOUNTER — Emergency Department: Payer: Medicare Other

## 2022-09-28 ENCOUNTER — Emergency Department
Admission: EM | Admit: 2022-09-28 | Discharge: 2022-09-28 | Disposition: A | Discharge status: Home / Self Care | Payer: Medicare Other | Attending: Emergency Medicine | Admitting: Emergency Medicine

## 2022-09-28 DIAGNOSIS — J069 Acute upper respiratory infection, unspecified: Secondary | ICD-10-CM | POA: Diagnosis not present

## 2022-09-28 DIAGNOSIS — U071 COVID-19: Secondary | ICD-10-CM | POA: Diagnosis not present

## 2022-09-28 DIAGNOSIS — J189 Pneumonia, unspecified organism: Secondary | ICD-10-CM

## 2022-09-28 DIAGNOSIS — I110 Hypertensive heart disease with heart failure: Secondary | ICD-10-CM | POA: Insufficient documentation

## 2022-09-28 DIAGNOSIS — I509 Heart failure, unspecified: Secondary | ICD-10-CM | POA: Insufficient documentation

## 2022-09-28 DIAGNOSIS — R059 Cough, unspecified: Secondary | ICD-10-CM | POA: Diagnosis present

## 2022-09-28 LAB — RESP PANEL BY RT-PCR (RSV, FLU A&B, COVID)  RVPGX2
Influenza A by PCR: NEGATIVE
Influenza B by PCR: NEGATIVE
Resp Syncytial Virus by PCR: NEGATIVE
SARS Coronavirus 2 by RT PCR: POSITIVE — AB

## 2022-09-28 MED ORDER — AZITHROMYCIN 500 MG PO TABS
500.0000 mg | ORAL_TABLET | Freq: Once | ORAL | Status: AC
  Administered 2022-09-28: 500 mg via ORAL
  Filled 2022-09-28: qty 1

## 2022-09-28 MED ORDER — BENZONATATE 100 MG PO CAPS: ORAL_CAPSULE | ORAL | 0 refills | Status: AC

## 2022-09-28 MED ORDER — AZITHROMYCIN 250 MG PO TABS: 250.0000 mg | ORAL_TABLET | Freq: Every day | ORAL | 0 refills | Status: AC

## 2022-09-28 NOTE — ED Provider Notes (Signed)
[facility] Emergency Department Provider Note     Event Date/Time   First MD Initiated Contact with Patient 09/28/22 1428     (approximate)   History   Cough   HPI  Latoya Sloan is a 89 y.o. female with a history of HTN, CHF, thyroid disease, and BLE lymphedema presents to the ED for evaluation of intermittent cough and chronic low back pain.  Patient reports a positive home COVID test about a week ago.  She states that home test today was negative, she continues to endorse a nighttime cough.  Patient with a chart problem list noting chronic cough, denies any shortness of breath or fevers.  Physical Exam   Triage Vital Signs: ED Triage Vitals  Enc Vitals Group     BP 09/28/22 1401 (!) 176/70     Pulse Rate 09/28/22 1401 64     Resp 09/28/22 1401 20     Temp 09/28/22 1401 98.1 F (36.7 C)     Temp src --      SpO2 09/28/22 1401 100 %     Weight 09/28/22 1402 250 lb (113.4 kg)     Height 09/28/22 1402 5\' 7"  (1.702 m)     Head Circumference --      Peak Flow --      Pain Score 09/28/22 1402 0     Pain Loc --      Pain Edu? --      Excl. in [place]? --     Most recent vital signs: Vitals:   09/28/22 1401  BP: (!) 176/70  Pulse: 64  Resp: 20  Temp: 98.1 F (36.7 C)  SpO2: 100%    General Awake, no distress. NAD HEENT NCAT. PERRL. EOMI. No rhinorrhea. Mucous membranes are moist.  CV:  Good peripheral perfusion.  RESP:  Normal effort.  ABD:  No distention.   ED Results / Procedures / Treatments   Labs (all labs ordered are listed, but only abnormal results are displayed) Labs Reviewed  RESP PANEL BY RT-PCR (RSV, FLU A&B, COVID)  [id number] - Abnormal; Notable for the following components:      Result Value   SARS Coronavirus 2 by RT PCR POSITIVE (*)    All other components within normal limits    EKG   RADIOLOGY  I personally viewed and evaluated these images as part of my medical decision making, as well as reviewing the written  report by the radiologist.  ED Provider Interpretation: bibasilar inflitrates  CXR  IMPRESSION: Bibasilar and right perihilar densities may represent infiltrate. Follow-up to resolution recommended.   PROCEDURES:  Critical Care performed: No  Procedures   MEDICATIONS ORDERED IN ED: Medications  azithromycin (ZITHROMAX) tablet 500 mg (500 mg Oral Given 09/28/22 1618)     IMPRESSION / MDM / ASSESSMENT AND PLAN / ED COURSE  I reviewed the triage vital signs and the nursing notes.                              Differential diagnosis includes, but is not limited to, COVID, flu, RSV, CAP, bronchitis  Patient's presentation is most consistent with acute complicated illness / injury requiring diagnostic workup.  Patient's diagnosis is consistent with COVID, flu, RSV, CAP, bronchitis. Patient will be discharged home with prescriptions for azithromycin and Tessalon Perles. Patient is to follow up with her primary provider as needed or otherwise directed. Patient is given ED precautions  to return to the ED for any worsening or new symptoms.     FINAL CLINICAL IMPRESSION(S) / ED DIAGNOSES   Final diagnoses:  Viral URI with cough  COVID-19  Community acquired pneumonia, unspecified laterality     Rx / DC Orders   ED Discharge Orders          Ordered    azithromycin (ZITHROMAX Z-PAK) 250 MG tablet  Daily        09/28/22 1603    benzonatate (TESSALON PERLES) 100 MG capsule        09/28/22 1603             Note:  This document was prepared using Dragon voice recognition software and may include unintentional dictation errors.    Melvenia Needles, PA-C 09/30/22 1856    Naaman Plummer, MD 10/02/22 [phone]

## 2022-09-28 NOTE — Discharge Instructions (Addendum)
Take the antibiotic as directed. Take the prescription cough medicine along with OTC Delsym for additional cough relief.

## 2022-09-28 NOTE — ED Triage Notes (Signed)
Pt states coming in with back pain for over 2 months and a cough. Pt states her at home covid test was negative.

## 2022-09-28 NOTE — ED Notes (Signed)
Was discharged by other RN.

## 2022-09-30 ENCOUNTER — Ambulatory Visit: Payer: Medicare Other | Admitting: Occupational Therapy

## 2022-10-02 ENCOUNTER — Telehealth: Payer: Self-pay

## 2022-10-02 ENCOUNTER — Ambulatory Visit: Payer: Medicare Other | Admitting: Occupational Therapy

## 2022-10-02 NOTE — Telephone Encounter (Signed)
    Patient visited [facility] 3/24  Have you been able to follow up with your primary care physician? Yes   The patient was or was not able to obtain any needed medicine or equipment. Yes   Are there diet recommendations that you are having difficulty following? Na   Patient expresses understanding of discharge instructions and education provided has no other needs at this time.  Yes      [place] [phone] [address], [place], [place] [address] Phone: [phone] Email: [email or web address]

## 2022-10-07 ENCOUNTER — Ambulatory Visit: Payer: Medicare Other | Admitting: Occupational Therapy

## 2022-10-09 ENCOUNTER — Ambulatory Visit: Payer: Medicare Other | Admitting: Occupational Therapy

## 2022-10-10 ENCOUNTER — Ambulatory Visit: Payer: Medicare Other | Admitting: Occupational Therapy

## 2022-10-13 ENCOUNTER — Ambulatory Visit: Payer: Medicare Other | Admitting: Occupational Therapy

## 2022-10-14 ENCOUNTER — Ambulatory Visit: Payer: Medicare Other | Admitting: Occupational Therapy

## 2022-10-16 ENCOUNTER — Ambulatory Visit: Payer: Medicare Other | Attending: Nurse Practitioner | Admitting: Occupational Therapy

## 2022-10-16 ENCOUNTER — Encounter: Payer: Self-pay | Admitting: Occupational Therapy

## 2022-10-16 ENCOUNTER — Ambulatory Visit: Payer: Medicare Other | Admitting: Occupational Therapy

## 2022-10-16 DIAGNOSIS — I890 Lymphedema, not elsewhere classified: Secondary | ICD-10-CM

## 2022-10-16 NOTE — Therapy (Signed)
OUTPATIENT OCCUPATIONAL THERAPY TREATMENT NOTE LOWER EXTREMITY LYMPHEDEMA  Patient Name: Dorita SciaraSadie Meeuwsen MRN: [id number] DOB:12-Nov-1931, 89 y.o., female Today's Date: 10/17/2022  REPORTING PERIOD:    END OF SESSION:  OT End of Session - 10/16/22 0858     Visit Number 16    Number of Visits 36    Date for OT Re-Evaluation 10/27/22    OT Start Time 0900    OT Stop Time 1000    OT Time Calculation (min) 60 min    Activity Tolerance Patient tolerated treatment well;No increased pain    Behavior During Therapy WFL for tasks assessed/performed                    Past Medical History:  Diagnosis Date   Hypertension    Lymphedema of both lower extremities    Thyroid disease    History reviewed. No pertinent surgical history. Patient Active Problem List   Diagnosis Date Noted   Bradycardia 01/16/2021   Chronic diastolic CHF (congestive heart failure), NYHA class 2 04/06/2020   Leg numbness 03/20/2020   Leg swelling 03/20/2020   Lymphedema of both lower extremities 03/20/2020   Anosmia 03/06/2020   Bilateral carotid artery stenosis 03/06/2020   Chronic cough 03/06/2020   Dyspnea on exertion 03/06/2020   GERD (gastroesophageal reflux disease) 03/06/2020   Hyperlipidemia 03/06/2020   Lymphedema 03/06/2020   Migraines 03/06/2020   Osteoarthritis of both knees 03/06/2020   Idiopathic peripheral neuropathy 12/29/2013   Localized, primary osteoarthritis 12/28/2013   Osteoarthritis of knee 12/28/2013   Essential hypertension 10/23/2010   Family history of stroke 10/22/2010    PCP: Enid Baasadhika Kalisetti, MD  REFERRING PROVIDER: Sheppard PlumberFallon Brown, NP  REFERRING DIAG: I89.0  THERAPY DIAG:  Lymphedema, not elsewhere classified  ONSET DATE: >10 yrs  SUBJECTIVE:                                                                                                                                                                                           SUBJECTIVE STATEMENT: Sabrinia  Enneking resumes Occupational Therapy for BLE lymphedema. Pt is unaccompanied today. Pt was last seen on 09/16/22 when treatment course was interrupted by respiratory illness. Pt has not used compression wraps since last OT visit. Pt reports her legs feel heavy and tight, but she does not rate LE-related pain.  She .    PERTINENT HISTORY: CHF, chronic , progressive lymphedema BLE, thyroid disease, Bradycardia, B knee OA, Periferal neuropathy, B carotid stenosis  PAIN:  Are you having pain? Yes: NPRS scale: not rated/10 Pain location: B legs Pain description: sore, tender, heavy, tight, achy Aggravating factors: weight bearing  Relieving factors: sitting, elevation, compression  PRECAUTIONS: Other: THYROID, B CAROTID ARTERY STENOSIS (NO strokes to lateral neck!) , CHF, BRADYCARDIA  FALLS:  Has patient fallen since last visit? No  HAND DOMINANCE: right   PRIOR LEVEL OF FUNCTION: Independent with basic ADLs, Independent with household mobility with device, Independent with community mobility with device, and Needs assistance with homemaking  PATIENT GOALS: "Because of the swelling, and I want to walk more. [name] gotten lazy about my walking.When I go to the vein doctor they wrapped my legs a couple of times, but I don't see them getting better."   OBJECTIVE:  OBSERVATIONS / OTHER ASSESSMENTS:   BLE COMPARATIVE LIMB VOLUMETRICS 10/16/22    LANDMARK  RIGHT  10/16/22   R LEG (A-D) 8223.8 ml  R THIGH (E-G)   R FULL LIMB (A-G)   Limb Volume differential (LVD)    Volume change since initial Increased 17.5% since last measured on 08/26/22%  Volume change overall Increased 8.6% since commencing OT on 07/31/22%  (Blank rows = not tested)   LANDMARK  RIGHT  08/26/22 (9th visit)  R LEG (A-D) 6997.3 ml  R THIGH (E-G)   R FULL LIMB (A-G)   Limb Volume differential (LVD)    Volume change since initial Decreased 7.6%  Volume change overall   (Blank rows = not tested)  LANDMARK  INITIAL RIGHT   07/31/22  R LEG (A-D) 7570.2 ml  R THIGH (E-G)   R FULL LIMB (A-G)   Limb Volume differential (LVD)    Volume change since initial %  Volume change overall   (Blank rows = not tested)  LANDMARK INITIAL LEFT  07/31/22  R LEG (A-D) 7224.4 ml  R THIGH (E-G) ml  R FULL LIMB (A-G) ml  Limb Volume differential (LVD)  4.6%, L leg  > R leg  Volume change since initial %  Volume change overall %  (Blank rows = not tested)   LYMPHEDEMA LIFE IMPACT SCALE (LLIS): 41.18%  (The extent to which LE-related problems affected your life in the past week.)  FOTO Outcome Measure: 58%   TODAY'S TREATMENT:                                                                                                                                         DATE:   Fitted and provided Pt edu for donning, doffing , garment wear schedule, calibrating compression and replacement schedule. Discussed precautions. (NO MLD) MLD to RLE/RLQ as established with simultaneous skin care utilizing functional inguinal watershed, deep abdominal lymphatics  and proximal to distal J strokes to entire limb NO WRAPS to RLE today- Pt wore CircAid on RLE after session  PATIENT EDUCATION:   Person educated: Pt edu for donning, doffing , garment wear schedule, calibrating compression and replacement schedule. Discussed precautions. Education method: Explanation, Demonstration, Handouts, and needs additional edu Education comprehension: verbalized understanding, returned demonstration, and  needs further education  LYMPHEDEMA SELF-CARE HOME PROGRAM 1.Therapeutic lymphatic pumping therex- 2 sets of 10 reps, hold 5 seconds; elements in order. 2 x daily PRN 2.  Full time, daily compression: Intensive Phase CDT: multilayer short stretch bandages from toes to popliteal using gradient techniques  Self-management Phase CDT: Attempt to use existing OTS, light duty adjustable leggings. Consider HOS devices  to limit fibrosis (Jobst Relax ccl 2) 3.  Daily skin care to sustain optimal hydration. Wash bites, scratches, blisters, etc with mild soap and water , apply antibacterial first aide cream and a band aide. 4. Simple self-Manual lymphatic drainage (MLD) PRN.: 5 Pt will benefit from daily use of advanced sequential compression device- Flexitouch- during self-management phase of CDT to assist with regular lymphatic decongestion to limit progression as Pt has difficulty reaching her distal legs and feet to perform simple self-MLD.Pt has used a basic Biotab device daily greater than 1 year with significant physical and sensory symptoms of lymphedema remaining. 6. Full   time, daytime, Custom, Mediven, CircAid Juxtafit A-D bilaterally over liners calibrated to 30-40 mmHg.  Lymphedema Self- Care Instructions   1. EXERCISE: Perform lymphatic pumping there ex at least 2 x a day while wearing your compression wraps or garments. Perform 10 reps of each exercise bilaterally and be sure to perform them in order. Don't skip around!  OMIT PARTIAL SIT UPs.  2. MLD: Perform simple self-manual lymphatic drainage (MLD) at least once a day as directed. Take your time! Breathe! ;-)  3. If you have a Flexitouch advanced "pump" use it 1 time each day on a single limb only. The Flexitouch moves lymphatic fluid out of your affected body part and back to your heart, so DO NOT use the Flexi on 2 legs at a time, and DO NOT cues it on 2 legs on the same day. If you experience any atypical shortness of breath, sudden onset of pain, or feelings of heart arhythmia, or racing, discontinue use of the Flexitouch and report these symptoms to your doctor right away. Also, discontinue Flexi if you have an infection or a fever. It's OK to resume using the device 72 hours AFTER your first dose of oral antibiotic.   4. 4. WRAPS: Compression wraps are to be worn 23 hrs/ 7 days/wk during Intensive Phase of Complete Decongestive Therapy (CDT).Building tolerance may take time and  practice, so don't get discouraged. If bandages begin to feel tight during periods of inactivity and/or during the night, try performing your exercises to loosen them. Do not leave short stretch wraps in place for > 23 hours. It is very important that you remove all wraps daily to inspect skin, bathe and perform skin care before reapplying your wraps.  5. Daytime GARMENTS/ HOS DEVICES: During the Self-Management Phase CDT your compression garments are to be worn during waking hours when active. Do NOT sleep in your garments!!  Don daytime garments first thing in the morning. Do not wear your HOS devices all day instead of garments. These will not contain your swelling.  6. PUT YOUR FEET UP! Elevate your feet and legs and feet to the level of your heart whenever you are sitting down.   7. SKIN: Carefully monitor skin condition and perform impeccable hygiene daily. Bathe skin with mild soap and water and apply low pH lotion (aka Eucerin ) to improve hydration and limit infection risk.    ASSESSMENT:  CLINICAL IMPRESSION: BLE volumes are dramatically increased since last seen 1 month ago. RLE comparative  limb volumetrics reveal leg volume (A-D) is increased  by 17.5% since last measured on 08/26/22%. The R leg volume is increased 8.6% since initially measured on 07/31/22. Unfortunately    OBJECTIVE IMPAIRMENTS: decreased knowledge of condition, decreased knowledge of use of DME, decreased mobility, difficulty walking, decreased ROM, increased edema, obesity, and pain. Unfortunately Pt was not able to retain clinical gains achieved during first 10 or so visits du to inconsistent compression. Systemic fluid retention may also be overloading lymphatics at present. Fitted Pt w compression stocking alternatives that enable her to calibrate compression as well as don and doff with less physical effort. After skilled training and opportunities to practice, Pt is able to don the R CircAid and calibrate the  compression   to 30-40 mmHg using the calibration cared provided by the manufacturer. Pt has used Velcro wrap style garment alternatives in the past, but she has not been able to apply them with consistent, medical grade compression. Plan is for Pt to use R CircAid daily between now and next session   to see if these help to reduce limb volume since they offer short stretch technology. If they do not , then we will resume multilayer wrapping to RLE to get  volume down before resuming CircAid to keep lymphatic swelling down.   ACTIVITY LIMITATIONS: decreased standing and walking tolerance > 30 minutes,  carrying, lifting, bending, extended dependent sitting, squatting, stairs, transfers, lower body bathing, LB grooming, LB dressing, productive, leisure and social participation requiring walking, standing and/ or dependent sitting >30 minutes ( attending social and community activities)  PERSONAL FACTORS: Age, time since onset, and 3+ co morbidities: CHF, OA, HTN are also affecting patient's functional outcome.   REHAB POTENTIAL: Good- with daily CG assistance with compression wraps during Intensive CDT. Poor without assistance with multilayer wraps  EVALUATION COMPLEXITY: moderate   GOALS: Goals reviewed with patient? Yes  SHORT TERM GOALS: Target date: 4th OT  Rx visit   Pt will demonstrate understanding of lymphedema precautions and prevention strategies with modified independence using a printed reference to identify at least 5 precautions and discussing how s/he may implement them into daily life to reduce risk of progression with modified assistance. Baseline:Max A Goal status:08/21/22 GOAL MET  2.   With Max caregiver assistance Pt will be able to apply multilayer, knee length, compression wraps using gradient techniques to decrease limb volume, to limit infection risk, and to limit lymphedema progression.  Baseline:Max A Goal status: 08/07/22 GOAL MET w caregiver. 08/13/22 Goal exceeded with  Pt modified independent with wraps to knee.  LONG TERM GOALS: Target date: 10/27/22  Given this patient's Intake score of 58 /100% on the functional outcomes FOTO tool, patient will experience an increase in function of 3 points to improve basic and instrumental ADLs performance, including lymphedema self-care. (TBA at first OT Rx visit) Baseline: Dependent Goal status: 07/29/22: ONGOING  2.  Given this patient's Intake score of 41.18% on the Lymphedema Life Impact Scale (LLIS), patient will experience a reduction of at least 5% in her perceived level of functional impairment resulting from lymphedema to improve functional performance and quality of life (QOL). Baseline: Dependent Goal status: 07/29/22: ONGOING  3.  With modified assistance (extra time) Pt will be able to don and doff appropriate compression garments and/or devices using correct calibration  for ~ 20-30 mmHg  to control BLE lymphedema and to limit progression.  Baseline: Mod A Goal status: 07/29/22: ONGOING  4.  Pt will achieve at least  a 10% volume reductions bilaterally below the knees to return limb to more typical size and shape, to limit infection risk and LE progression, to decrease pain, to improve function, and to improve body image and QOL. Baseline: Max A Goal status: 08/26/22: RLE: 7.6% volume reduction achieved in RLE measured on 9th visit.   10/16/22 RLE volume increased 17.5% during illness interval. Overall increase in R leg volume = 8.6%  5.  During Intensive phase CDT Pt will achieve at least 85% compliance with all lymphedema self-care home program components, including  daily skin care, multilayer , gradient compression wraps with daily changes, daily simple self MLD and daily lymphatic pumping therex to achieve optimal clinical outcome and to habituate self care regime for optimal LE self-management over time. Baseline: Max A Goal status:07/29/22: ONGOING   PLAN:  PT FREQUENCY: 2x/week, and PRN  PT  DURATION: 12 weeks  PLANNED INTERVENTIONS: Complete Decongestive Therapy (CDT) Therapeutic exercises, Therapeutic activity, Patient/Family education, Self Care, DME instructions, Manual lymph drainage, Compression bandaging, and Manual therapy; fit with appropriate compression garments that control swelling and that Pt can don and doff          with extra time and/ or assistive devices; consider trial with advanced sequential pneumatic compression device (Flexitouch) trial which mimics proximal to distal MLD for optimal lymphatic return.  Multilayer compression bandaging below the knee to single leg at a time to limit falls risk. Utilizing one each 8, 10 and 12 cm wide x 5 meters long, short stretch   bandages applied in staggered gradient fashion over single layer of .04 cm thick Rosidal foam applied circumferentially  over cotton stockinett from toes to popliteal fossa.  MLD to RLE/RLQ utilizing modified short neck sequence utilizing strokes to clavicles only. ( NO STROKES TO LATERAL NECK BILATERALLY)  diaphragmatic breathing to activate deep abdominal lymphatics and thoracic duct, functional inguinal LNs, and proximal to distal J strokes to thigh, knee, leg, ankle and foot, then retrograde sweep to finish w/ modified short neck sequence.  PLAN FOR NEXT SESSION:  MLD to RLE w simultaneous skin care Knee length multilayer compression wraps vs CircAid?? Cont Pt edu for LE self  care  Loel Dubonnet, MS, OTR/L, CLT-LANA 10/17/22 9:14 AM [place]

## 2022-10-17 ENCOUNTER — Encounter: Payer: Self-pay | Admitting: Occupational Therapy

## 2022-10-17 ENCOUNTER — Ambulatory Visit: Payer: Medicare Other | Admitting: Occupational Therapy

## 2022-10-20 ENCOUNTER — Ambulatory Visit: Payer: Medicare Other | Admitting: Occupational Therapy

## 2022-10-20 ENCOUNTER — Encounter: Payer: Medicare Other | Admitting: Occupational Therapy

## 2022-10-21 ENCOUNTER — Ambulatory Visit: Payer: Medicare Other | Admitting: Occupational Therapy

## 2022-10-22 ENCOUNTER — Ambulatory Visit: Payer: Medicare Other | Admitting: Occupational Therapy

## 2022-10-22 ENCOUNTER — Encounter: Payer: Self-pay | Admitting: Occupational Therapy

## 2022-10-22 DIAGNOSIS — I890 Lymphedema, not elsewhere classified: Secondary | ICD-10-CM | POA: Diagnosis not present

## 2022-10-22 NOTE — Therapy (Signed)
OUTPATIENT OCCUPATIONAL THERAPY TREATMENT NOTE LOWER EXTREMITY LYMPHEDEMA  Patient Name: Latoya Sloan MRN: [id number] DOB:1931/09/10, 89 y.o., female Today's Date: 10/22/2022  REPORTING PERIOD:    END OF SESSION:  OT End of Session - 10/22/22 1023     Visit Number 17    Number of Visits 36    Date for OT Re-Evaluation 10/27/22    OT Start Time 1005    OT Stop Time 1100    OT Time Calculation (min) 55 min    Activity Tolerance Patient tolerated treatment well;No increased pain    Behavior During Therapy WFL for tasks assessed/performed                    Past Medical History:  Diagnosis Date   Hypertension    Lymphedema of both lower extremities    Thyroid disease    No past surgical history on file. Patient Active Problem List   Diagnosis Date Noted   Bradycardia 01/16/2021   Chronic diastolic CHF (congestive heart failure), NYHA class 2 04/06/2020   Leg numbness 03/20/2020   Leg swelling 03/20/2020   Lymphedema of both lower extremities 03/20/2020   Anosmia 03/06/2020   Bilateral carotid artery stenosis 03/06/2020   Chronic cough 03/06/2020   Dyspnea on exertion 03/06/2020   GERD (gastroesophageal reflux disease) 03/06/2020   Hyperlipidemia 03/06/2020   Lymphedema 03/06/2020   Migraines 03/06/2020   Osteoarthritis of both knees 03/06/2020   Idiopathic peripheral neuropathy 12/29/2013   Localized, primary osteoarthritis 12/28/2013   Osteoarthritis of knee 12/28/2013   Essential hypertension 10/23/2010   Family history of stroke 10/22/2010    PCP: Enid Baas, MD  REFERRING PROVIDER: Sheppard Plumber, NP  REFERRING DIAG: I89.0  THERAPY DIAG:  Lymphedema, not elsewhere classified  ONSET DATE: >10 yrs  SUBJECTIVE:                                                                                                                                                                                           SUBJECTIVE STATEMENT: Latoya Sloan resumes  Occupational Therapy for BLE lymphedema. Pt is unaccompanied today. Pt presents with new compression garment alternative (CircAid Juxtafit) on the R leg, and existing light duty,      Velcro style adjustable legging on the L leg. Pt reports she is retaining fluid due to dietary salt in the prepared food at her assisted living facility. . She tells me residents have discussed these concerns with admin in the past  to no avail. Pt states she knows fluid retention contributes to difficulty managing her fluctuating leg swelling. Pt also reports it's time to get shots in her  knees for OA again. She thinks this time they will use the gel.    PERTINENT HISTORY: CHF, chronic , progressive lymphedema BLE, thyroid disease, Bradycardia, B knee OA, Periferal neuropathy, B carotid stenosis  PAIN:  Are you having pain? Yes: NPRS scale: not rated/10 Pain location: B legs Pain description: sore, tender, heavy, tight, achy Aggravating factors: weight bearing Relieving factors: sitting, elevation, compression  PRECAUTIONS: Other: THYROID, B CAROTID ARTERY STENOSIS (NO strokes to lateral neck!) , CHF, BRADYCARDIA  FALLS:  Has patient fallen since last visit? No  HAND DOMINANCE: right   PRIOR LEVEL OF FUNCTION: Independent with basic ADLs, Independent with household mobility with device, Independent with community mobility with device, and Needs assistance with homemaking  PATIENT GOALS: "Because of the swelling, and I want to walk more. [name] gotten lazy about my walking.When I go to the vein doctor they wrapped my legs a couple of times, but I don't see them getting better."   OBJECTIVE:  OBSERVATIONS / OTHER ASSESSMENTS:   BLE COMPARATIVE LIMB VOLUMETRICS 10/16/22    LANDMARK  RIGHT  10/16/22   R LEG (A-D) 8223.8 ml  R THIGH (E-G)   R FULL LIMB (A-G)   Limb Volume differential (LVD)    Volume change since initial Increased 17.5% since last measured on 08/26/22%  Volume change overall Increased 8.6%  since commencing OT on 07/31/22%  (Blank rows = not tested)   LANDMARK  RIGHT  08/26/22 (9th visit)  R LEG (A-D) 6997.3 ml  R THIGH (E-G)   R FULL LIMB (A-G)   Limb Volume differential (LVD)    Volume change since initial Decreased 7.6%  Volume change overall   (Blank rows = not tested)  LANDMARK  INITIAL RIGHT  07/31/22  R LEG (A-D) 7570.2 ml  R THIGH (E-G)   R FULL LIMB (A-G)   Limb Volume differential (LVD)    Volume change since initial %  Volume change overall   (Blank rows = not tested)  LANDMARK INITIAL LEFT  07/31/22  R LEG (A-D) 7224.4 ml  R THIGH (E-G) ml  R FULL LIMB (A-G) ml  Limb Volume differential (LVD)  4.6%, L leg  > R leg  Volume change since initial %  Volume change overall %  (Blank rows = not tested)   LYMPHEDEMA LIFE IMPACT SCALE (LLIS): 41.18%  (The extent to which LE-related problems affected your life in the past week.)  FOTO Outcome Measure: 58%   TODAY'S TREATMENT:                                                                                                                                         DATE:   MLD and simultaneous skin care to the RLE/RLQ as established. Pt wore CircAid on RLE after session. Added single layer of Rosidal foam circumferentially on top of liners in effort to accentuate muscle pump in leg.  PATIENT EDUCATION:   Person educated: Pt edu for donning, doffing , garment wear schedule, calibrating compression and replacement schedule. Discussed precautions. Education method: Explanation, Demonstration, Handouts, and needs additional edu Education comprehension: verbalized understanding, returned demonstration, and needs further education  LYMPHEDEMA SELF-CARE HOME PROGRAM 1.Therapeutic lymphatic pumping therex- 2 sets of 10 reps, hold 5 seconds; elements in order. 2 x daily PRN 2.  Full time, daily compression: Intensive Phase CDT: multilayer short stretch bandages from toes to popliteal using gradient techniques   Self-management Phase CDT: Attempt to use existing OTS, light duty adjustable leggings. Consider HOS devices  to limit fibrosis (Jobst Relax ccl 2) 3. Daily skin care to sustain optimal hydration. Wash bites, scratches, blisters, etc with mild soap and water , apply antibacterial first aide cream and a band aide. 4. Simple self-Manual lymphatic drainage (MLD) PRN.: 5 Pt will benefit from daily use of advanced sequential compression device- Flexitouch- during self-management phase of CDT to assist with regular lymphatic decongestion to limit progression as Pt has difficulty reaching her distal legs and feet to perform simple self-MLD.Pt has used a basic Biotab device daily greater than 1 year with significant physical and sensory symptoms of lymphedema remaining. 6. Full   time, daytime, Custom, Mediven, CircAid Juxtafit A-D bilaterally over liners calibrated to 30-40 mmHg.  Lymphedema Self- Care Instructions   1. EXERCISE: Perform lymphatic pumping there ex at least 2 x a day while wearing your compression wraps or garments. Perform 10 reps of each exercise bilaterally and be sure to perform them in order. Don't skip around!  OMIT PARTIAL SIT UPs.  2. MLD: Perform simple self-manual lymphatic drainage (MLD) at least once a day as directed. Take your time! Breathe! ;-)  3. If you have a Flexitouch advanced "pump" use it 1 time each day on a single limb only. The Flexitouch moves lymphatic fluid out of your affected body part and back to your heart, so DO NOT use the Flexi on 2 legs at a time, and DO NOT cues it on 2 legs on the same day. If you experience any atypical shortness of breath, sudden onset of pain, or feelings of heart arhythmia, or racing, discontinue use of the Flexitouch and report these symptoms to your doctor right away. Also, discontinue Flexi if you have an infection or a fever. It's OK to resume using the device 72 hours AFTER your first dose of oral antibiotic.   4. 4. WRAPS:  Compression wraps are to be worn 23 hrs/ 7 days/wk during Intensive Phase of Complete Decongestive Therapy (CDT).Building tolerance may take time and practice, so don't get discouraged. If bandages begin to feel tight during periods of inactivity and/or during the night, try performing your exercises to loosen them. Do not leave short stretch wraps in place for > 23 hours. It is very important that you remove all wraps daily to inspect skin, bathe and perform skin care before reapplying your wraps.  5. Daytime GARMENTS/ HOS DEVICES: During the Self-Management Phase CDT your compression garments are to be worn during waking hours when active. Do NOT sleep in your garments!!  Don daytime garments first thing in the morning. Do not wear your HOS devices all day instead of garments. These will not contain your swelling.  6. PUT YOUR FEET UP! Elevate your feet and legs and feet to the level of your heart whenever you are sitting down.   7. SKIN: Carefully monitor skin condition and perform impeccable hygiene daily. Bathe skin with mild  soap and water and apply low pH lotion (aka Eucerin ) to improve hydration and limit infection risk.    ASSESSMENT:  CLINICAL IMPRESSION: RLE swelling appears slightly reduced compared with last session, but skin remains tight and shiny. Pt tolerated MLD to RLE/RLQ with increased pain at lateral knee with very light touch. Pain dissipated and Pt had no further pain in this area during remainder of session. We modified CircAids today by adding a single layer of 0.4 cm thick Rosidal foam over liner and under the CircAid in an effort to strengthen muscle pumping action in the limb Pt reports the leggings are more comfortable this way. We'll resume multilayer bandaging next session if we don't see a more dramatic reduction with the short stretch CircAid by next visit. Cont as per POC.   OBJECTIVE IMPAIRMENTS: decreased knowledge of condition, decreased knowledge of use of DME,  decreased mobility, difficulty walking, decreased ROM, increased edema, obesity, and pain. Unfortunately Pt was not able to retain clinical gains achieved during first 10 or so visits du to inconsistent compression. Systemic fluid retention may also be overloading lymphatics at present. Fitted Pt w compression stocking alternatives that enable her to calibrate compression as well as don and doff with less physical effort. After skilled training and opportunities to practice, Pt is able to don the R CircAid and calibrate the compression   to 30-40 mmHg using the calibration cared provided by the manufacturer. Pt has used Velcro wrap style garment alternatives in the past, but she has not been able to apply them with consistent, medical grade compression. Plan is for Pt to use R CircAid daily between now and next session   to see if these help to reduce limb volume since they offer short stretch technology. If they do not , then we will resume multilayer wrapping to RLE to get  volume down before resuming CircAid to keep lymphatic swelling down.   ACTIVITY LIMITATIONS: decreased standing and walking tolerance > 30 minutes,  carrying, lifting, bending, extended dependent sitting, squatting, stairs, transfers, lower body bathing, LB grooming, LB dressing, productive, leisure and social participation requiring walking, standing and/ or dependent sitting >30 minutes ( attending social and community activities)  PERSONAL FACTORS: Age, time since onset, and 3+ co morbidities: CHF, OA, HTN are also affecting patient's functional outcome.   REHAB POTENTIAL: Good- with daily CG assistance with compression wraps during Intensive CDT. Poor without assistance with multilayer wraps  EVALUATION COMPLEXITY: moderate   GOALS: Goals reviewed with patient? Yes  SHORT TERM GOALS: Target date: 4th OT  Rx visit   Pt will demonstrate understanding of lymphedema precautions and prevention strategies with modified  independence using a printed reference to identify at least 5 precautions and discussing how s/he may implement them into daily life to reduce risk of progression with modified assistance. Baseline:Max A Goal status:08/21/22 GOAL MET  2.   With Max caregiver assistance Pt will be able to apply multilayer, knee length, compression wraps using gradient techniques to decrease limb volume, to limit infection risk, and to limit lymphedema progression.  Baseline:Max A Goal status: 08/07/22 GOAL MET w caregiver. 08/13/22 Goal exceeded with Pt modified independent with wraps to knee.  LONG TERM GOALS: Target date: 10/27/22  Given this patient's Intake score of 58 /100% on the functional outcomes FOTO tool, patient will experience an increase in function of 3 points to improve basic and instrumental ADLs performance, including lymphedema self-care. (TBA at first OT Rx visit) Baseline: Dependent Goal status:  07/29/22: ONGOING  2.  Given this patient's Intake score of 41.18% on the Lymphedema Life Impact Scale (LLIS), patient will experience a reduction of at least 5% in her perceived level of functional impairment resulting from lymphedema to improve functional performance and quality of life (QOL). Baseline: Dependent Goal status: 07/29/22: ONGOING  3.  With modified assistance (extra time) Pt will be able to don and doff appropriate compression garments and/or devices using correct calibration  for ~ 20-30 mmHg  to control BLE lymphedema and to limit progression.  Baseline: Mod A Goal status: 07/29/22: ONGOING  4.  Pt will achieve at least a 10% volume reductions bilaterally below the knees to return limb to more typical size and shape, to limit infection risk and LE progression, to decrease pain, to improve function, and to improve body image and QOL. Baseline: Max A Goal status: 08/26/22: RLE: 7.6% volume reduction achieved in RLE measured on 9th visit.   10/16/22 RLE volume increased 17.5% during illness  interval. Overall increase in R leg volume = 8.6%  5.  During Intensive phase CDT Pt will achieve at least 85% compliance with all lymphedema self-care home program components, including  daily skin care, multilayer , gradient compression wraps with daily changes, daily simple self MLD and daily lymphatic pumping therex to achieve optimal clinical outcome and to habituate self care regime for optimal LE self-management over time. Baseline: Max A Goal status:07/29/22: ONGOING   PLAN:  PT FREQUENCY: 2x/week, and PRN  PT DURATION: 12 weeks  PLANNED INTERVENTIONS: Complete Decongestive Therapy (CDT) Therapeutic exercises, Therapeutic activity, Patient/Family education, Self Care, DME instructions, Manual lymph drainage, Compression bandaging, and Manual therapy; fit with appropriate compression garments that control swelling and that Pt can don and doff          with extra time and/ or assistive devices; consider trial with advanced sequential pneumatic compression device (Flexitouch) trial which mimics proximal to distal MLD for optimal lymphatic return.  Multilayer compression bandaging below the knee to single leg at a time to limit falls risk. Utilizing one each 8, 10 and 12 cm wide x 5 meters long, short stretch   bandages applied in staggered gradient fashion over single layer of .04 cm thick Rosidal foam applied circumferentially  over cotton stockinett from toes to popliteal fossa.  MLD to RLE/RLQ utilizing modified short neck sequence utilizing strokes to clavicles only. ( NO STROKES TO LATERAL NECK BILATERALLY)  diaphragmatic breathing to activate deep abdominal lymphatics and thoracic duct, functional inguinal LNs, and proximal to distal J strokes to thigh, knee, leg, ankle and foot, then retrograde sweep to finish w/ modified short neck sequence.  PLAN FOR NEXT SESSION:  MLD to RLE w simultaneous skin care Knee length multilayer compression wraps vs CircAid?? Cont Pt edu for LE self   care  Loel Dubonnet, MS, OTR/L, CLT-LANA 10/22/22 11:00 AM [place]

## 2022-10-23 ENCOUNTER — Ambulatory Visit: Payer: Medicare Other | Admitting: Occupational Therapy

## 2022-10-27 ENCOUNTER — Ambulatory Visit: Payer: Medicare Other | Admitting: Occupational Therapy

## 2022-10-27 ENCOUNTER — Encounter: Payer: Medicare Other | Admitting: Occupational Therapy

## 2022-10-28 ENCOUNTER — Ambulatory Visit: Payer: Medicare Other | Admitting: Occupational Therapy

## 2022-10-29 ENCOUNTER — Ambulatory Visit: Payer: Medicare Other | Admitting: Occupational Therapy

## 2022-10-29 ENCOUNTER — Encounter: Payer: Self-pay | Admitting: Occupational Therapy

## 2022-10-29 DIAGNOSIS — I890 Lymphedema, not elsewhere classified: Secondary | ICD-10-CM

## 2022-10-29 NOTE — Therapy (Signed)
OUTPATIENT OCCUPATIONAL THERAPY TREATMENT NOTE LOWER EXTREMITY LYMPHEDEMA  Patient Name: Latoya Sloan MRN: [id number] DOB:08/27/1931, 89 y.o., female Today's Date: 10/29/2022  REPORTING PERIOD:    END OF SESSION:  OT End of Session - 10/29/22 1012     Visit Number 18    Number of Visits 36    Date for OT Re-Evaluation 10/27/22    OT Start Time 1000    Activity Tolerance Patient tolerated treatment well;No increased pain    Behavior During Therapy WFL for tasks assessed/performed                    Past Medical History:  Diagnosis Date   Hypertension    Lymphedema of both lower extremities    Thyroid disease    History reviewed. No pertinent surgical history. Patient Active Problem List   Diagnosis Date Noted   Bradycardia 01/16/2021   Chronic diastolic CHF (congestive heart failure), NYHA class 2 04/06/2020   Leg numbness 03/20/2020   Leg swelling 03/20/2020   Lymphedema of both lower extremities 03/20/2020   Anosmia 03/06/2020   Bilateral carotid artery stenosis 03/06/2020   Chronic cough 03/06/2020   Dyspnea on exertion 03/06/2020   GERD (gastroesophageal reflux disease) 03/06/2020   Hyperlipidemia 03/06/2020   Lymphedema 03/06/2020   Migraines 03/06/2020   Osteoarthritis of both knees 03/06/2020   Idiopathic peripheral neuropathy 12/29/2013   Localized, primary osteoarthritis 12/28/2013   Osteoarthritis of knee 12/28/2013   Essential hypertension 10/23/2010   Family history of stroke 10/22/2010    PCP: Enid Baas, MD  REFERRING PROVIDER: Sheppard Plumber, NP  REFERRING DIAG: I89.0  THERAPY DIAG:  Lymphedema, not elsewhere classified  ONSET DATE: >10 yrs  SUBJECTIVE:                                                                                                                                                                                           SUBJECTIVE STATEMENT: Latoya Sloan presents to Occupational Therapy for BLE lymphedema  wearing new CircAid JuxtaFit on the R leg and existing, light duty adjustable wrap on the LLE. Pt denies LE related pain this morning. Pt reports she got  shots in her knees for OA this past Friday and they feel much better.  She thinks this time they will use the gel. Pt reports they are having a meeting at her assisted living facility about the menu and salt content of the food.   PERTINENT HISTORY: CHF, chronic , progressive lymphedema BLE, thyroid disease, Bradycardia, B knee OA, Periferal neuropathy, B carotid stenosis  PAIN:  Are you having pain? Yes: NPRS scale: not rated/10 Pain location: B  legs Pain description: sore, tender, heavy, tight, achy Aggravating factors: weight bearing Relieving factors: sitting, elevation, compression  PRECAUTIONS: Other: THYROID, B CAROTID ARTERY STENOSIS (NO strokes to lateral neck!) , CHF, BRADYCARDIA  FALLS:  Has patient fallen since last visit? No  HAND DOMINANCE: right   PRIOR LEVEL OF FUNCTION: Independent with basic ADLs, Independent with household mobility with device, Independent with community mobility with device, and Needs assistance with homemaking  PATIENT GOALS: "Because of the swelling, and I want to walk more. [name] gotten lazy about my walking.When I go to the vein doctor they wrapped my legs a couple of times, but I don't see them getting better."   OBJECTIVE:  OBSERVATIONS / OTHER ASSESSMENTS:   BLE COMPARATIVE LIMB VOLUMETRICS 10/16/22    LANDMARK  RIGHT  10/16/22   R LEG (A-D) 8223.8 ml  R THIGH (E-G)   R FULL LIMB (A-G)   Limb Volume differential (LVD)    Volume change since initial Increased 17.5% since last measured on 08/26/22%  Volume change overall Increased 8.6% since commencing OT on 07/31/22%  (Blank rows = not tested)   LANDMARK  RIGHT  08/26/22 (9th visit)  R LEG (A-D) 6997.3 ml  R THIGH (E-G)   R FULL LIMB (A-G)   Limb Volume differential (LVD)    Volume change since initial Decreased 7.6%  Volume change  overall   (Blank rows = not tested)  LANDMARK  INITIAL RIGHT  07/31/22  R LEG (A-D) 7570.2 ml  R THIGH (E-G)   R FULL LIMB (A-G)   Limb Volume differential (LVD)    Volume change since initial %  Volume change overall   (Blank rows = not tested)  LANDMARK INITIAL LEFT  07/31/22  R LEG (A-D) 7224.4 ml  R THIGH (E-G) ml  R FULL LIMB (A-G) ml  Limb Volume differential (LVD)  4.6%, L leg  > R leg  Volume change since initial %  Volume change overall %  (Blank rows = not tested)   LYMPHEDEMA LIFE IMPACT SCALE (LLIS): 41.18%  (The extent to which LE-related problems affected your life in the past week.)  FOTO Outcome Measure: 58%   TODAY'S TREATMENT:                                                                                                                                         DATE:  MLD and simultaneous skin care to the RLE/RLQ as established. Pt wore CircAid on RLE after session. Added single layer of Rosidal foam circumferentially on top of liners in effort to accentuate muscle pump in leg.  PATIENT EDUCATION:   Person educated: Pt edu for donning, doffing , garment wear schedule, calibrating compression and replacement schedule. Discussed precautions. Education method: Explanation, Demonstration, Handouts, and needs additional edu Education comprehension: verbalized understanding, returned demonstration, and needs further education  LYMPHEDEMA SELF-CARE HOME PROGRAM 1.Therapeutic lymphatic pumping therex-  2 sets of 10 reps, hold 5 seconds; elements in order. 2 x daily PRN 2.  Full time, daily compression: Intensive Phase CDT: multilayer short stretch bandages from toes to popliteal using gradient techniques  Self-management Phase CDT: Attempt to use existing OTS, light duty adjustable leggings. Consider HOS devices  to limit fibrosis (Jobst Relax ccl 2) 3. Daily skin care to sustain optimal hydration. Wash bites, scratches, blisters, etc with mild soap and water , apply  antibacterial first aide cream and a band aide. 4. Simple self-Manual lymphatic drainage (MLD) PRN.: 5 Pt will benefit from daily use of advanced sequential compression device- Flexitouch- during self-management phase of CDT to assist with regular lymphatic decongestion to limit progression as Pt has difficulty reaching her distal legs and feet to perform simple self-MLD.Pt has used a basic Biotab device daily greater than 1 year with significant physical and sensory symptoms of lymphedema remaining. 6. Full   time, daytime, Custom, Mediven, CircAid Juxtafit A-D bilaterally over liners calibrated to 30-40 mmHg.  Lymphedema Self- Care Instructions   1. EXERCISE: Perform lymphatic pumping there ex at least 2 x a day while wearing your compression wraps or garments. Perform 10 reps of each exercise bilaterally and be sure to perform them in order. Don't skip around!  OMIT PARTIAL SIT UPs.  2. MLD: Perform simple self-manual lymphatic drainage (MLD) at least once a day as directed. Take your time! Breathe! ;-)  3. If you have a Flexitouch advanced "pump" use it 1 time each day on a single limb only. The Flexitouch moves lymphatic fluid out of your affected body part and back to your heart, so DO NOT use the Flexi on 2 legs at a time, and DO NOT cues it on 2 legs on the same day. If you experience any atypical shortness of breath, sudden onset of pain, or feelings of heart arhythmia, or racing, discontinue use of the Flexitouch and report these symptoms to your doctor right away. Also, discontinue Flexi if you have an infection or a fever. It's OK to resume using the device 72 hours AFTER your first dose of oral antibiotic.   4. 4. WRAPS: Compression wraps are to be worn 23 hrs/ 7 days/wk during Intensive Phase of Complete Decongestive Therapy (CDT).Building tolerance may take time and practice, so don't get discouraged. If bandages begin to feel tight during periods of inactivity and/or during the night,  try performing your exercises to loosen them. Do not leave short stretch wraps in place for > 23 hours. It is very important that you remove all wraps daily to inspect skin, bathe and perform skin care before reapplying your wraps.  5. Daytime GARMENTS/ HOS DEVICES: During the Self-Management Phase CDT your compression garments are to be worn during waking hours when active. Do NOT sleep in your garments!!  Don daytime garments first thing in the morning. Do not wear your HOS devices all day instead of garments. These will not contain your swelling.  6. PUT YOUR FEET UP! Elevate your feet and legs and feet to the level of your heart whenever you are sitting down.   7. SKIN: Carefully monitor skin condition and perform impeccable hygiene daily. Bathe skin with mild soap and water and apply low pH lotion (aka Eucerin ) to improve hydration and limit infection risk.    ASSESSMENT:  CLINICAL IMPRESSION: RLE swelling appears obviously reduced compared with last session, but skin wrinkles are obvious with excellent excursion.. Pt with significantly decreased inflammation and pain in  R knee s/p injection last week.  Pt tolerated RLE MLD without increased pain. Used CircAid after session on R leg. Next session consider commencing CDT to LLE. Cont as per POC.   OBJECTIVE IMPAIRMENTS: decreased knowledge of condition, decreased knowledge of use of DME, decreased mobility, difficulty walking, decreased ROM, increased edema, obesity, and pain. Unfortunately Pt was not able to retain clinical gains achieved during first 10 or so visits du to inconsistent compression. Systemic fluid retention may also be overloading lymphatics at present. Fitted Pt w compression stocking alternatives that enable her to calibrate compression as well as don and doff with less physical effort. After skilled training and opportunities to practice, Pt is able to don the R CircAid and calibrate the compression   to 30-40 mmHg using the  calibration cared provided by the manufacturer. Pt has used Velcro wrap style garment alternatives in the past, but she has not been able to apply them with consistent, medical grade compression. Plan is for Pt to use R CircAid daily between now and next session   to see if these help to reduce limb volume since they offer short stretch technology. If they do not , then we will resume multilayer wrapping to RLE to get  volume down before resuming CircAid to keep lymphatic swelling down.   ACTIVITY LIMITATIONS: decreased standing and walking tolerance > 30 minutes,  carrying, lifting, bending, extended dependent sitting, squatting, stairs, transfers, lower body bathing, LB grooming, LB dressing, productive, leisure and social participation requiring walking, standing and/ or dependent sitting >30 minutes ( attending social and community activities)  PERSONAL FACTORS: Age, time since onset, and 3+ co morbidities: CHF, OA, HTN are also affecting patient's functional outcome.   REHAB POTENTIAL: Good- with daily CG assistance with compression wraps during Intensive CDT. Poor without assistance with multilayer wraps  EVALUATION COMPLEXITY: moderate   GOALS: Goals reviewed with patient? Yes  SHORT TERM GOALS: Target date: 4th OT  Rx visit   Pt will demonstrate understanding of lymphedema precautions and prevention strategies with modified independence using a printed reference to identify at least 5 precautions and discussing how s/he may implement them into daily life to reduce risk of progression with modified assistance. Baseline:Max A Goal status:08/21/22 GOAL MET  2.   With Max caregiver assistance Pt will be able to apply multilayer, knee length, compression wraps using gradient techniques to decrease limb volume, to limit infection risk, and to limit lymphedema progression.  Baseline:Max A Goal status: 08/07/22 GOAL MET w caregiver. 08/13/22 Goal exceeded with Pt modified independent with wraps to  knee.  LONG TERM GOALS: Target date: 10/27/22  Given this patient's Intake score of 58 /100% on the functional outcomes FOTO tool, patient will experience an increase in function of 3 points to improve basic and instrumental ADLs performance, including lymphedema self-care. (TBA at first OT Rx visit) Baseline: Dependent Goal status: 07/29/22: ONGOING  2.  Given this patient's Intake score of 41.18% on the Lymphedema Life Impact Scale (LLIS), patient will experience a reduction of at least 5% in her perceived level of functional impairment resulting from lymphedema to improve functional performance and quality of life (QOL). Baseline: Dependent Goal status: 07/29/22: ONGOING  3.  With modified assistance (extra time) Pt will be able to don and doff appropriate compression garments and/or devices using correct calibration  for ~ 20-30 mmHg  to control BLE lymphedema and to limit progression.  Baseline: Mod A Goal status: 07/29/22: ONGOING  4.  Pt will achieve  at least a 10% volume reductions bilaterally below the knees to return limb to more typical size and shape, to limit infection risk and LE progression, to decrease pain, to improve function, and to improve body image and QOL. Baseline: Max A Goal status: 08/26/22: RLE: 7.6% volume reduction achieved in RLE measured on 9th visit.   10/16/22 RLE volume increased 17.5% during illness interval. Overall increase in R leg volume = 8.6%  5.  During Intensive phase CDT Pt will achieve at least 85% compliance with all lymphedema self-care home program components, including  daily skin care, multilayer , gradient compression wraps with daily changes, daily simple self MLD and daily lymphatic pumping therex to achieve optimal clinical outcome and to habituate self care regime for optimal LE self-management over time. Baseline: Max A Goal status:07/29/22: ONGOING   PLAN:  PT FREQUENCY: 2x/week, and PRN  PT DURATION: 12 weeks  PLANNED INTERVENTIONS:  Complete Decongestive Therapy (CDT) Therapeutic exercises, Therapeutic activity, Patient/Family education, Self Care, DME instructions, Manual lymph drainage, Compression bandaging, and Manual therapy; fit with appropriate compression garments that control swelling and that Pt can don and doff          with extra time and/ or assistive devices; consider trial with advanced sequential pneumatic compression device (Flexitouch) trial which mimics proximal to distal MLD for optimal lymphatic return.  Multilayer compression bandaging below the knee to single leg at a time to limit falls risk. Utilizing one each 8, 10 and 12 cm wide x 5 meters long, short stretch   bandages applied in staggered gradient fashion over single layer of .04 cm thick Rosidal foam applied circumferentially  over cotton stockinett from toes to popliteal fossa.  MLD to RLE/RLQ utilizing modified short neck sequence utilizing strokes to clavicles only. ( NO STROKES TO LATERAL NECK BILATERALLY)  diaphragmatic breathing to activate deep abdominal lymphatics and thoracic duct, functional inguinal LNs, and proximal to distal J strokes to thigh, knee, leg, ankle and foot, then retrograde sweep to finish w/ modified short neck sequence.  PLAN FOR NEXT SESSION:  MLD to RLE w simultaneous skin care Knee length multilayer compression wraps vs CircAid?? Cont Pt edu for LE self  care  Loel Dubonnet, MS, OTR/L, CLT-LANA 10/29/22 10:14 AM [place]

## 2022-10-30 ENCOUNTER — Ambulatory Visit: Payer: Medicare Other | Admitting: Occupational Therapy

## 2022-10-31 ENCOUNTER — Ambulatory Visit: Payer: Medicare Other | Admitting: Occupational Therapy

## 2022-11-03 ENCOUNTER — Ambulatory Visit: Payer: Medicare Other | Admitting: Occupational Therapy

## 2022-11-04 ENCOUNTER — Ambulatory Visit: Payer: Medicare Other | Admitting: Occupational Therapy

## 2022-11-05 ENCOUNTER — Encounter: Payer: Self-pay | Admitting: Occupational Therapy

## 2022-11-05 ENCOUNTER — Ambulatory Visit: Payer: Medicare Other | Attending: Nurse Practitioner | Admitting: Occupational Therapy

## 2022-11-05 ENCOUNTER — Ambulatory Visit: Payer: Medicare Other | Admitting: Occupational Therapy

## 2022-11-05 DIAGNOSIS — I890 Lymphedema, not elsewhere classified: Secondary | ICD-10-CM | POA: Insufficient documentation

## 2022-11-05 NOTE — Therapy (Signed)
OUTPATIENT OCCUPATIONAL THERAPY TREATMENT NOTE LOWER EXTREMITY LYMPHEDEMA  Patient Name: Latoya Sloan MRN: [id number] DOB:[birth year], 89 y.o., female Today's Date: 11/05/2022  REPORTING PERIOD:    END OF SESSION:  OT End of Session - 11/05/22 1124     Visit Number 19    Number of Visits 36    Date for OT Re-Evaluation 10/27/22    OT Start Time 1105    OT Stop Time 1205    OT Time Calculation (min) 60 min    Activity Tolerance Patient tolerated treatment well;No increased pain    Behavior During Therapy WFL for tasks assessed/performed                    Past Medical History:  Diagnosis Date   Hypertension    Lymphedema of both lower extremities    Thyroid disease    History reviewed. No pertinent surgical history. Patient Active Problem List   Diagnosis Date Noted   Bradycardia 01/16/2021   Chronic diastolic CHF (congestive heart failure), NYHA class 2 (HCC) 04/06/2020   Leg numbness 03/20/2020   Leg swelling 03/20/2020   Lymphedema of both lower extremities 03/20/2020   Anosmia 03/06/2020   Bilateral carotid artery stenosis 03/06/2020   Chronic cough 03/06/2020   Dyspnea on exertion 03/06/2020   GERD (gastroesophageal reflux disease) 03/06/2020   Hyperlipidemia 03/06/2020   Lymphedema 03/06/2020   Migraines 03/06/2020   Osteoarthritis of both knees 03/06/2020   Idiopathic peripheral neuropathy 12/29/2013   Localized, primary osteoarthritis 12/28/2013   Osteoarthritis of knee 12/28/2013   Essential hypertension 10/23/2010   Family history of stroke 10/22/2010    PCP: Enid Baas, MD  REFERRING PROVIDER: Sheppard Plumber, NP  REFERRING DIAG: I89.0  THERAPY DIAG:  Lymphedema, not elsewhere classified  ONSET DATE: >10 yrs  SUBJECTIVE:                                                                                                                                                                                           SUBJECTIVE STATEMENT: Latoya  Sloan presents to Occupational Therapy for BLE lymphedema wearing older, light duty, adjustable, Velcro, wrap style compression stocking alternatives bilaterally stating she is alternating the new CircAids with her older, less effective leggings because she doesn't want the new CircAids to get dirty. Pt denies LE related pain this morning. Pt has no new complaints.   PERTINENT HISTORY: CHF, chronic , progressive lymphedema BLE, thyroid disease, Bradycardia, B knee OA, Periferal neuropathy, B carotid stenosis  PAIN:  Are you having pain? No: NPRS scale: 0/10 Pain location: B legs Pain description: sore, tender, heavy, tight, achy Aggravating factors: weight  bearing Relieving factors: sitting, elevation, compression  PRECAUTIONS: Other: THYROID, B CAROTID ARTERY STENOSIS (NO strokes to lateral neck!) , CHF, BRADYCARDIA  FALLS:  Has patient fallen since last visit? No  HAND DOMINANCE: right   PRIOR LEVEL OF FUNCTION: Independent with basic ADLs, Independent with household mobility with device, Independent with community mobility with device, and Needs assistance with homemaking  PATIENT GOALS: "Because of the swelling, and I want to walk more. [name] gotten lazy about my walking.When I go to the vein doctor they wrapped my legs a couple of times, but I don't see them getting better."   OBJECTIVE:  OBSERVATIONS / OTHER ASSESSMENTS:   BLE COMPARATIVE LIMB VOLUMETRICS 10/16/22    LANDMARK  RIGHT  10/16/22   R LEG (A-D) 8223.8 ml  R THIGH (E-G)   R FULL LIMB (A-G)   Limb Volume differential (LVD)    Volume change since initial Increased 17.5% since last measured on 08/26/22%  Volume change overall Increased 8.6% since commencing OT on 07/31/22%  (Blank rows = not tested)   LANDMARK  RIGHT  08/26/22 (9th visit)  R LEG (A-D) 6997.3 ml  R THIGH (E-G)   R FULL LIMB (A-G)   Limb Volume differential (LVD)    Volume change since initial Decreased 7.6%  Volume change overall   (Blank rows =  not tested)  LANDMARK  INITIAL RIGHT  07/31/22  R LEG (A-D) 7570.2 ml  R THIGH (E-G)   R FULL LIMB (A-G)   Limb Volume differential (LVD)    Volume change since initial %  Volume change overall   (Blank rows = not tested)  LANDMARK INITIAL LEFT  07/31/22  R LEG (A-D) 7224.4 ml  R THIGH (E-G) ml  R FULL LIMB (A-G) ml  Limb Volume differential (LVD)  4.6%, L leg  > R leg  Volume change since initial %  Volume change overall %  (Blank rows = not tested)   LYMPHEDEMA LIFE IMPACT SCALE (LLIS): 41.18%  (The extent to which LE-related problems affected your life in the past week.)  FOTO Outcome Measure: 58%   TODAY'S TREATMENT:                                                                                                                                         Completed measurements and specifications for initial LLE custom CircAid Juxtafit Max A to don B JuxtaFits  due to time constraints. Added single layer of Rosidal foam circumferentially on top of liners in effort to accentuate muscle pump in leg.  PATIENT EDUCATION:  Pt edu for donning, doffing , garment wear schedule, calibrating compression and replacement schedule. Discussed precautions. Education method: Explanation, Demonstration, Handouts, and needs additional edu Education comprehension: verbalized understanding, returned demonstration, and needs further education  LYMPHEDEMA SELF-CARE HOME PROGRAM 1.Therapeutic lymphatic pumping therex- 2 sets of 10 reps, hold 5 seconds; elements in order. 2  x daily PRN 2.  Full time, daily compression: Intensive Phase CDT: multilayer short stretch bandages from toes to popliteal using gradient techniques  Self-management Phase CDT: Attempt to use existing OTS, light duty adjustable leggings. Consider HOS devices  to limit fibrosis (Jobst Relax ccl 2) 3. Daily skin care to sustain optimal hydration. Wash bites, scratches, blisters, etc with mild soap and water , apply antibacterial first  aide cream and a band aide. 4. Simple self-Manual lymphatic drainage (MLD) PRN.: 5 Pt will benefit from daily use of advanced sequential compression device- Flexitouch- during self-management phase of CDT to assist with regular lymphatic decongestion to limit progression as Pt has difficulty reaching her distal legs and feet to perform simple self-MLD.Pt has used a basic Biotab device daily greater than 1 year with significant physical and sensory symptoms of lymphedema remaining. 6. Full   time, daytime, Custom, Mediven, CircAid Juxtafit A-D bilaterally over liners calibrated to 30-40 mmHg.  Lymphedema Self- Care Instructions   1. EXERCISE: Perform lymphatic pumping there ex at least 2 x a day while wearing your compression wraps or garments. Perform 10 reps of each exercise bilaterally and be sure to perform them in order. Don't skip around!  OMIT PARTIAL SIT UPs.  2. MLD: Perform simple self-manual lymphatic drainage (MLD) at least once a day as directed. Take your time! Breathe! ;-)  3. If you have a Flexitouch advanced "pump" use it 1 time each day on a single limb only. The Flexitouch moves lymphatic fluid out of your affected body part and back to your heart, so DO NOT use the Flexi on 2 legs at a time, and DO NOT cues it on 2 legs on the same day. If you experience any atypical shortness of breath, sudden onset of pain, or feelings of heart arhythmia, or racing, discontinue use of the Flexitouch and report these symptoms to your doctor right away. Also, discontinue Flexi if you have an infection or a fever. It's OK to resume using the device 72 hours AFTER your first dose of oral antibiotic.   4. 4. WRAPS: Compression wraps are to be worn 23 hrs/ 7 days/wk during Intensive Phase of Complete Decongestive Therapy (CDT).Building tolerance may take time and practice, so don't get discouraged. If bandages begin to feel tight during periods of inactivity and/or during the night, try performing your  exercises to loosen them. Do not leave short stretch wraps in place for > 23 hours. It is very important that you remove all wraps daily to inspect skin, bathe and perform skin care before reapplying your wraps.  5. Daytime GARMENTS/ HOS DEVICES: During the Self-Management Phase CDT your compression garments are to be worn during waking hours when active. Do NOT sleep in your garments!!  Don daytime garments first thing in the morning. Do not wear your HOS devices all day instead of garments. These will not contain your swelling.  6. PUT YOUR FEET UP! Elevate your feet and legs and feet to the level of your heart whenever you are sitting down.   7. SKIN: Carefully monitor skin condition and perform impeccable hygiene daily. Bathe skin with mild soap and water and apply low pH lotion (aka Eucerin ) to improve hydration and limit infection risk.    ASSESSMENT:  CLINICAL IMPRESSION: Completed measurements and specifications for initial LLE custom CircAid Juxtafit knee high for L leg. Using these short stretch , wrap style leggings are easier and quicker to don and doff than short stretch bandages for this  patient. Continue to Add single layer of Rosidal foam circumferentially on top of liners in effort to accentuate muscle pump in leg.Pt will continue 1 x weekly until leggings are fitted and Pt completes all goals. Cont as per POC.  OBJECTIVE IMPAIRMENTS: decreased knowledge of condition, decreased knowledge of use of DME, decreased mobility, difficulty walking, decreased ROM, increased edema, obesity, and pain. Unfortunately Pt was not able to retain clinical gains achieved during first 10 or so visits du to inconsistent compression. Systemic fluid retention may also be overloading lymphatics at present. Fitted Pt w compression stocking alternatives that enable her to calibrate compression as well as don and doff with less physical effort. After skilled training and opportunities to practice, Pt is able  to don the R CircAid and calibrate the compression   to 30-40 mmHg using the calibration cared provided by the manufacturer. Pt has used Velcro wrap style garment alternatives in the past, but she has not been able to apply them with consistent, medical grade compression. Plan is for Pt to use R CircAid daily between now and next session   to see if these help to reduce limb volume since they offer short stretch technology. If they do not , then we will resume multilayer wrapping to RLE to get  volume down before resuming CircAid to keep lymphatic swelling down.   ACTIVITY LIMITATIONS: decreased standing and walking tolerance > 30 minutes,  carrying, lifting, bending, extended dependent sitting, squatting, stairs, transfers, lower body bathing, LB grooming, LB dressing, productive, leisure and social participation requiring walking, standing and/ or dependent sitting >30 minutes ( attending social and community activities)  PERSONAL FACTORS: Age, time since onset, and 3+ co morbidities: CHF, OA, HTN are also affecting patient's functional outcome.   REHAB POTENTIAL: Good- with daily CG assistance with compression wraps during Intensive CDT. Poor without assistance with multilayer wraps  EVALUATION COMPLEXITY: moderate   GOALS: Goals reviewed with patient? Yes  SHORT TERM GOALS: Target date: 4th OT  Rx visit   Pt will demonstrate understanding of lymphedema precautions and prevention strategies with modified independence using a printed reference to identify at least 5 precautions and discussing how s/he may implement them into daily life to reduce risk of progression with modified assistance. Baseline:Max A Goal status:08/21/22 GOAL MET  2.   With Max caregiver assistance Pt will be able to apply multilayer, knee length, compression wraps using gradient techniques to decrease limb volume, to limit infection risk, and to limit lymphedema progression.  Baseline:Max A Goal status: 08/07/22 GOAL MET w  caregiver. 08/13/22 Goal exceeded with Pt modified independent with wraps to knee.  LONG TERM GOALS: Target date: 10/27/22  Given this patient's Intake score of 58 /100% on the functional outcomes FOTO tool, patient will experience an increase in function of 3 points to improve basic and instrumental ADLs performance, including lymphedema self-care. (TBA at first OT Rx visit) Baseline: Dependent Goal status: 07/29/22: ONGOING  2.  Given this patient's Intake score of 41.18% on the Lymphedema Life Impact Scale (LLIS), patient will experience a reduction of at least 5% in her perceived level of functional impairment resulting from lymphedema to improve functional performance and quality of life (QOL). Baseline: Dependent Goal status: 07/29/22: ONGOING  3.  With modified assistance (extra time) Pt will be able to don and doff appropriate compression garments and/or devices using correct calibration  for ~ 20-30 mmHg  to control BLE lymphedema and to limit progression.  Baseline: Mod A Goal status: 07/29/22:  ONGOING  4.  Pt will achieve at least a 10% volume reductions bilaterally below the knees to return limb to more typical size and shape, to limit infection risk and LE progression, to decrease pain, to improve function, and to improve body image and QOL. Baseline: Max A Goal status: 08/26/22: RLE: 7.6% volume reduction achieved in RLE measured on 9th visit.   10/16/22 RLE volume increased 17.5% during illness interval. Overall increase in R leg volume = 8.6%  5.  During Intensive phase CDT Pt will achieve at least 85% compliance with all lymphedema self-care home program components, including  daily skin care, multilayer , gradient compression wraps with daily changes, daily simple self MLD and daily lymphatic pumping therex to achieve optimal clinical outcome and to habituate self care regime for optimal LE self-management over time. Baseline: Max A Goal status:07/29/22: ONGOING   PLAN:  PT  FREQUENCY: 1 x/week, and PRN  PT DURATION: 12 weeks  PLANNED INTERVENTIONS: Complete Decongestive Therapy (CDT) Therapeutic exercises, Therapeutic activity, Patient/Family education, Self Care, DME instructions, Manual lymph drainage, Compression bandaging, and Manual therapy; fit with appropriate compression garments that control swelling and that Pt can don and doff          with extra time and/ or assistive devices; consider trial with advanced sequential pneumatic compression device (Flexitouch) trial which mimics proximal to distal MLD for optimal lymphatic return.  Multilayer compression bandaging below the knee to single leg at a time to limit falls risk. Utilizing one each 8, 10 and 12 cm wide x 5 meters long, short stretch   bandages applied in staggered gradient fashion over single layer of .04 cm thick Rosidal foam applied circumferentially  over cotton stockinett from toes to popliteal fossa.  MLD to RLE/RLQ utilizing modified short neck sequence utilizing strokes to clavicles only. ( NO STROKES TO LATERAL NECK BILATERALLY)  diaphragmatic breathing to activate deep abdominal lymphatics and thoracic duct, functional inguinal LNs, and proximal to distal J strokes to thigh, knee, leg, ankle and foot, then retrograde sweep to finish w/ modified short neck sequence.  PLAN FOR NEXT SESSION:  MLD to RLE w simultaneous skin care Knee length multilayer compression wraps vs CircAid?? Cont Pt edu for LE self  care  Loel Dubonnet, MS, OTR/L, CLT-LANA 11/05/22 12:59 PM

## 2022-11-06 ENCOUNTER — Ambulatory Visit: Payer: Medicare Other | Admitting: Occupational Therapy

## 2022-11-10 ENCOUNTER — Ambulatory Visit: Payer: Medicare Other | Admitting: Occupational Therapy

## 2022-11-11 ENCOUNTER — Ambulatory Visit: Payer: Medicare Other | Admitting: Occupational Therapy

## 2022-11-12 ENCOUNTER — Ambulatory Visit: Payer: Medicare Other | Admitting: Occupational Therapy

## 2022-11-13 ENCOUNTER — Ambulatory Visit: Payer: Medicare Other | Admitting: Occupational Therapy

## 2022-11-14 ENCOUNTER — Ambulatory Visit: Payer: Medicare Other | Admitting: Occupational Therapy

## 2022-11-14 ENCOUNTER — Encounter: Payer: Self-pay | Admitting: Occupational Therapy

## 2022-11-14 DIAGNOSIS — I890 Lymphedema, not elsewhere classified: Secondary | ICD-10-CM

## 2022-11-14 NOTE — Therapy (Deleted)
[facility] at [facility] [address] [place], [place], [address] Phone: [phone]   Fax:  [phone]  Patient Details  Name: Nneoma Bertelsen MRN: [id number] Date of Birth: January 05, 1932 Referring Provider:  Georgiana Spinner, NP  Encounter Date: 11/14/2022   Judithann Sauger, OT 11/14/2022, 11:56 AM  [facility] at [facility] [address] [place], [place], [address] Phone: [phone]   Fax:  [phone]

## 2022-11-14 NOTE — Therapy (Signed)
OUTPATIENT OCCUPATIONAL THERAPY TREATMENT NOTE and PROGRESS REPORT LOWER EXTREMITY LYMPHEDEMA  Patient Name: Chinda Shinabery MRN: [id number] DOB:08-Oct-1931, 89 y.o., female Today's Date: 11/14/2022  REPORTING PERIOD:  09/02/22 11/14/22  END OF SESSION:  OT End of Session - 11/14/22 1154     Visit Number 20    Number of Visits 36    Date for OT Re-Evaluation 02/12/23    OT Start Time 1100    OT Stop Time 1200    OT Time Calculation (min) 60 min    Activity Tolerance Patient tolerated treatment well;No increased pain    Behavior During Therapy WFL for tasks assessed/performed                    Past Medical History:  Diagnosis Date   Hypertension    Lymphedema of both lower extremities    Thyroid disease    History reviewed. No pertinent surgical history. Patient Active Problem List   Diagnosis Date Noted   Bradycardia 01/16/2021   Chronic diastolic CHF (congestive heart failure), NYHA class 2 (HCC) 04/06/2020   Leg numbness 03/20/2020   Leg swelling 03/20/2020   Lymphedema of both lower extremities 03/20/2020   Anosmia 03/06/2020   Bilateral carotid artery stenosis 03/06/2020   Chronic cough 03/06/2020   Dyspnea on exertion 03/06/2020   GERD (gastroesophageal reflux disease) 03/06/2020   Hyperlipidemia 03/06/2020   Lymphedema 03/06/2020   Migraines 03/06/2020   Osteoarthritis of both knees 03/06/2020   Idiopathic peripheral neuropathy 12/29/2013   Localized, primary osteoarthritis 12/28/2013   Osteoarthritis of knee 12/28/2013   Essential hypertension 10/23/2010   Family history of stroke 10/22/2010    PCP: Enid Baas, MD  REFERRING PROVIDER: Sheppard Plumber, NP  REFERRING DIAG: I89.0  THERAPY DIAG:  Lymphedema, not elsewhere classified  ONSET DATE: >10 yrs  SUBJECTIVE:                                                                                                                                                                                            SUBJECTIVE STATEMENT: Abigaile Stanzione presents to Occupational Therapy for BLE lymphedema wearing older, light duty, adjustable, Velcro, wrap style compression stocking alternatives bilaterally stating she is alternating the new CircAids with her older, less effective leggings because she doesn't want the new CircAids to get dirty. Pt denies LE related pain this morning. Pt has no new complaints. Recent injection in her knees has given relief from arthritis pain. Pt denies LE related -pain today   PERTINENT HISTORY: CHF, chronic , progressive lymphedema BLE, thyroid disease, Bradycardia, B knee OA, Periferal neuropathy, B carotid stenosis  PAIN:  Are  you having pain? No: NPRS scale: 0/10 Pain location: B legs Pain description: sore, tender, heavy, tight, achy Aggravating factors: weight bearing Relieving factors: sitting, elevation, compression  PRECAUTIONS: Other: THYROID, B CAROTID ARTERY STENOSIS (NO strokes to lateral neck!) , CHF, BRADYCARDIA  FALLS:  Has patient fallen since last visit? No  HAND DOMINANCE: right   PRIOR LEVEL OF FUNCTION: Independent with basic ADLs, Independent with household mobility with device, Independent with community mobility with device, and Needs assistance with homemaking  PATIENT GOALS: "Because of the swelling, and I want to walk more. [name] gotten lazy about my walking.When I go to the vein doctor they wrapped my legs a couple of times, but I don't see them getting better."   OBJECTIVE:  OBSERVATIONS / OTHER ASSESSMENTS:   BLE COMPARATIVE LIMB VOLUMETRICS 10/16/22    LANDMARK  RIGHT  10/16/22   R LEG (A-D) 8223.8 ml  R THIGH (E-G)   R FULL LIMB (A-G)   Limb Volume differential (LVD)    Volume change since initial Increased 17.5% since last measured on 08/26/22%  Volume change overall Increased 8.6% since commencing OT on 07/31/22%  (Blank rows = not tested)   LANDMARK  RIGHT  08/26/22 (9th visit)  R LEG (A-D) 6997.3 ml  R THIGH (E-G)   R  FULL LIMB (A-G)   Limb Volume differential (LVD)    Volume change since initial Decreased 7.6%  Volume change overall   (Blank rows = not tested)  LANDMARK  INITIAL RIGHT  07/31/22  R LEG (A-D) 7570.2 ml  R THIGH (E-G)   R FULL LIMB (A-G)   Limb Volume differential (LVD)    Volume change since initial %  Volume change overall   (Blank rows = not tested)  LANDMARK INITIAL LEFT  07/31/22  R LEG (A-D) 7224.4 ml  R THIGH (E-G) ml  R FULL LIMB (A-G) ml  Limb Volume differential (LVD)  4.6%, L leg  > R leg  Volume change since initial %  Volume change overall %  (Blank rows = not tested)   LYMPHEDEMA LIFE IMPACT SCALE (LLIS): 41.18%  (The extent to which LE-related problems affected your life in the past week.)  FOTO Outcome Measure: 58%   TODAY'S TREATMENT:                                                                                                                                         Reviewed progress towards goals.  MLD with simultaneous skin care as established to RLE/RLQ Hybrid compression bilaterally using adjustable CircAid   leggings calibrated to 30-40 mmHg over single layer of Rosidal foam   PATIENT EDUCATION:  Lymphedema self care home,e program components Education method: Explanation, Demonstration, Handouts, and needs additional edu Education comprehension: verbalized understanding, returned demonstration, and needs further education  LYMPHEDEMA SELF-CARE HOME PROGRAM 1.Therapeutic lymphatic pumping therex- 2 sets of 10 reps, hold 5  seconds; elements in order. 2 x daily PRN 2.  Full time, daily compression: Intensive Phase CDT: multilayer short stretch bandages from toes to popliteal using gradient techniques  Self-management Phase CDT: Attempt to use existing OTS, light duty adjustable leggings. Consider HOS devices  to limit fibrosis (Jobst Relax ccl 2) 3. Daily skin care to sustain optimal hydration. Wash bites, scratches, blisters, etc with mild soap and  water , apply antibacterial first aide cream and a band aide. 4. Simple self-Manual lymphatic drainage (MLD) PRN.: 5 Pt will benefit from daily use of advanced sequential compression device- Flexitouch- during self-management phase of CDT to assist with regular lymphatic decongestion to limit progression as Pt has difficulty reaching her distal legs and feet to perform simple self-MLD.Pt has used a basic Biotab device daily greater than 1 year with significant physical and sensory symptoms of lymphedema remaining. 6. Full   time, daytime, Custom, Mediven, CircAid Juxtafit A-D bilaterally over liners calibrated to 30-40 mmHg.  Lymphedema Self- Care Instructions   1. EXERCISE: Perform lymphatic pumping there ex at least 2 x a day while wearing your compression wraps or garments. Perform 10 reps of each exercise bilaterally and be sure to perform them in order. Don't skip around!  OMIT PARTIAL SIT UPs.  2. MLD: Perform simple self-manual lymphatic drainage (MLD) at least once a day as directed. Take your time! Breathe! ;-)  3. If you have a Flexitouch advanced "pump" use it 1 time each day on a single limb only. The Flexitouch moves lymphatic fluid out of your affected body part and back to your heart, so DO NOT use the Flexi on 2 legs at a time, and DO NOT cues it on 2 legs on the same day. If you experience any atypical shortness of breath, sudden onset of pain, or feelings of heart arhythmia, or racing, discontinue use of the Flexitouch and report these symptoms to your doctor right away. Also, discontinue Flexi if you have an infection or a fever. It's OK to resume using the device 72 hours AFTER your first dose of oral antibiotic.   4. 4. WRAPS: Compression wraps are to be worn 23 hrs/ 7 days/wk during Intensive Phase of Complete Decongestive Therapy (CDT).Building tolerance may take time and practice, so don't get discouraged. If bandages begin to feel tight during periods of inactivity and/or  during the night, try performing your exercises to loosen them. Do not leave short stretch wraps in place for > 23 hours. It is very important that you remove all wraps daily to inspect skin, bathe and perform skin care before reapplying your wraps.  5. Daytime GARMENTS/ HOS DEVICES: During the Self-Management Phase CDT your compression garments are to be worn during waking hours when active. Do NOT sleep in your garments!!  Don daytime garments first thing in the morning. Do not wear your HOS devices all day instead of garments. These will not contain your swelling.  6. PUT YOUR FEET UP! Elevate your feet and legs and feet to the level of your heart whenever you are sitting down.   7. SKIN: Carefully monitor skin condition and perform impeccable hygiene daily. Bathe skin with mild soap and water and apply low pH lotion (aka Eucerin ) to improve hydration and limit infection risk.    ASSESSMENT:  CLINICAL IMPRESSION: To date R leg volume is increased by 17.5% SINCE LAST MEASURED ON 08/26/22. I believe this is due to worsening inflammation in the knee due to arthritis creating inflammatory swelling below  the knee. Pt had shot in her knee a couple of weeks ago and is getting some relief from pain. I believe her volumetrics will reveal a reduction   when we measure on the 10 th visit. Pt is abl;e to don and doff R knee length CircAid legging and calibrate it to 30-40 mmHg using card as trained. Pt remains 85%, or better compliant w LE self care between visits, despite minimal response to treatment to date in terms of volume reduction. Please refer to GOAL section for additional detail re progress towards all goals to date.  Pt tolerated RLE/RLQ MLD without increased pain. DME vendor       emailed OT during visit stating Pt's LLE CircAid legging was ordered today. Re applied legging over foam. Cont as per POC.  OBJECTIVE IMPAIRMENTS: decreased knowledge of condition, decreased knowledge of use of DME,  decreased mobility, difficulty walking, decreased ROM, increased edema, obesity, and pain. Unfortunately Pt was not able to retain clinical gains achieved during first 10 or so visits du to inconsistent compression. Systemic fluid retention may also be overloading lymphatics at present. Fitted Pt w compression stocking alternatives that enable her to calibrate compression as well as don and doff with less physical effort. After skilled training and opportunities to practice, Pt is able to don the R CircAid and calibrate the compression   to 30-40 mmHg using the calibration cared provided by the manufacturer. Pt has used Velcro wrap style garment alternatives in the past, but she has not been able to apply them with consistent, medical grade compression. Plan is for Pt to use R CircAid daily between now and next session   to see if these help to reduce limb volume since they offer short stretch technology. If they do not , then we will resume multilayer wrapping to RLE to get  volume down before resuming CircAid to keep lymphatic swelling down.   ACTIVITY LIMITATIONS: decreased standing and walking tolerance > 30 minutes,  carrying, lifting, bending, extended dependent sitting, squatting, stairs, transfers, lower body bathing, LB grooming, LB dressing, productive, leisure and social participation requiring walking, standing and/ or dependent sitting >30 minutes ( attending social and community activities)  PERSONAL FACTORS: Age, time since onset, and 3+ co morbidities: CHF, OA, HTN are also affecting patient's functional outcome.   REHAB POTENTIAL: Good- with daily CG assistance with compression wraps during Intensive CDT. Poor without assistance with multilayer wraps  EVALUATION COMPLEXITY: moderate   GOALS: Goals reviewed with patient? Yes  SHORT TERM GOALS: Target date: 4th OT  Rx visit   Pt will demonstrate understanding of lymphedema precautions and prevention strategies with modified  independence using a printed reference to identify at least 5 precautions and discussing how s/he may implement them into daily life to reduce risk of progression with modified assistance. Baseline:Max A Goal status:08/21/22 GOAL MET  2.   With Max caregiver assistance Pt will be able to apply multilayer, knee length, compression wraps using gradient techniques to decrease limb volume, to limit infection risk, and to limit lymphedema progression.  Baseline:Max A Goal status: 08/07/22 GOAL MET w caregiver. 08/13/22 Goal exceeded with Pt modified independent with wraps to knee.  LONG TERM GOALS: Target date: 10/27/22  Given this patient's Intake score of 58 /100% on the functional outcomes FOTO tool, patient will experience an increase in function of 3 points to improve basic and instrumental ADLs performance, including lymphedema self-care. (TBA at first OT Rx visit) Baseline: Dependent Goal status: 07/29/22: ONGOING  2.  Given this patient's Intake score of 41.18% on the Lymphedema Life Impact Scale (LLIS), patient will experience a reduction of at least 5% in her perceived level of functional impairment resulting from lymphedema to improve functional performance and quality of life (QOL). Baseline: Dependent Goal status: 07/29/22: ONGOING  3.  With modified assistance (extra time) Pt will be able to don and doff appropriate compression garments and/or devices using correct calibration  for ~ 20-30 mmHg  to control BLE lymphedema and to limit progression.  Baseline: Mod A Goal status: 07/29/22: ONGOING  4.  Pt will achieve at least a 10% volume reductions bilaterally below the knees to return limb to more typical size and shape, to limit infection risk and LE progression, to decrease pain, to improve function, and to improve body image and QOL. Baseline: Max A Goal status: 08/26/22: RLE: 7.6% volume reduction achieved in RLE measured on 9th visit.   10/16/22 RLE volume increased 17.5% during illness  interval. Overall increase in R leg volume = 8.6%  5.  During Intensive phase CDT Pt will achieve at least 85% compliance with all lymphedema self-care home program components, including  daily skin care, multilayer , gradient compression wraps with daily changes, daily simple self MLD and daily lymphatic pumping therex to achieve optimal clinical outcome and to habituate self care regime for optimal LE self-management over time. Baseline: Max A Goal status:07/29/22: ONGOING   PLAN:  PT FREQUENCY: 1 x/week, and PRN  PT DURATION: 12 weeks  PLANNED INTERVENTIONS: Complete Decongestive Therapy (CDT) Therapeutic exercises, Therapeutic activity, Patient/Family education, Self Care, DME instructions, Manual lymph drainage, Compression bandaging, and Manual therapy; fit with appropriate compression garments that control swelling and that Pt can don and doff          with extra time and/ or assistive devices; consider trial with advanced sequential pneumatic compression device (Flexitouch) trial which mimics proximal to distal MLD for optimal lymphatic return.  Multilayer compression bandaging below the knee to single leg at a time to limit falls risk. Utilizing one each 8, 10 and 12 cm wide x 5 meters long, short stretch   bandages applied in staggered gradient fashion over single layer of .04 cm thick Rosidal foam applied circumferentially  over cotton stockinett from toes to popliteal fossa.  MLD to RLE/RLQ utilizing modified short neck sequence utilizing strokes to clavicles only. ( NO STROKES TO LATERAL NECK BILATERALLY)  diaphragmatic breathing to activate deep abdominal lymphatics and thoracic duct, functional inguinal LNs, and proximal to distal J strokes to thigh, knee, leg, ankle and foot, then retrograde sweep to finish w/ modified short neck sequence.  PLAN FOR NEXT SESSION:  MLD to RLE w simultaneous skin care Knee length multilayer compression wraps vs CircAid?? Cont Pt edu for LE self   care  Loel Dubonnet, MS, OTR/L, CLT-LANA 11/14/22 11:58 AM

## 2022-11-17 ENCOUNTER — Ambulatory Visit: Payer: Medicare Other | Admitting: Occupational Therapy

## 2022-11-18 ENCOUNTER — Ambulatory Visit: Payer: Medicare Other | Admitting: Occupational Therapy

## 2022-11-19 ENCOUNTER — Ambulatory Visit: Payer: Medicare Other | Admitting: Occupational Therapy

## 2022-11-20 ENCOUNTER — Ambulatory Visit: Payer: Medicare Other | Admitting: Occupational Therapy

## 2022-11-21 ENCOUNTER — Encounter: Payer: Self-pay | Admitting: Occupational Therapy

## 2022-11-21 ENCOUNTER — Ambulatory Visit: Payer: Medicare Other | Admitting: Occupational Therapy

## 2022-11-21 DIAGNOSIS — I890 Lymphedema, not elsewhere classified: Secondary | ICD-10-CM

## 2022-11-21 NOTE — Therapy (Signed)
OUTPATIENT OCCUPATIONAL THERAPY TREATMENT NOTE LOWER EXTREMITY LYMPHEDEMA  Patient Name: Latoya Sloan MRN: [id number] DOB:01/01/1932, 89 y.o., female Today's Date: 11/14/2022  END OF SESSION:  OT End of Session - 11/14/22 1154     Visit Number 20    Number of Visits 36    Date for OT Re-Evaluation 02/12/23    OT Start Time 1100    OT Stop Time 1200    OT Time Calculation (min) 60 min    Activity Tolerance Patient tolerated treatment well;No increased pain    Behavior During Therapy WFL for tasks assessed/performed                    Past Medical History:  Diagnosis Date   Hypertension    Lymphedema of both lower extremities    Thyroid disease    History reviewed. No pertinent surgical history. Patient Active Problem List   Diagnosis Date Noted   Bradycardia 01/16/2021   Chronic diastolic CHF (congestive heart failure), NYHA class 2 (HCC) 04/06/2020   Leg numbness 03/20/2020   Leg swelling 03/20/2020   Lymphedema of both lower extremities 03/20/2020   Anosmia 03/06/2020   Bilateral carotid artery stenosis 03/06/2020   Chronic cough 03/06/2020   Dyspnea on exertion 03/06/2020   GERD (gastroesophageal reflux disease) 03/06/2020   Hyperlipidemia 03/06/2020   Lymphedema 03/06/2020   Migraines 03/06/2020   Osteoarthritis of both knees 03/06/2020   Idiopathic peripheral neuropathy 12/29/2013   Localized, primary osteoarthritis 12/28/2013   Osteoarthritis of knee 12/28/2013   Essential hypertension 10/23/2010   Family history of stroke 10/22/2010    PCP: Radhika Kalisetti, MD  REFERRING PROVIDER: Fallon Brown, NP  REFERRING DIAG: I89.0  THERAPY DIAG:  Lymphedema, not elsewhere classified  ONSET DATE: >10 yrs  SUBJECTIVE:                                                                                                                                                                                           SUBJECTIVE STATEMENT: Mada Bucker presents to  Occupational Therapy for BLE lymphedema wearing older, light duty, adjustable, Velcro, wrap style compression stocking alternatives bilaterally stating she is alternating the new CircAids with her older, less effective leggings because she doesn't want the new CircAids to get dirty. Pt denies LE related pain this morning. Pt has no new complaints. Recent injection in her R knee has given relief from arthritis pain. Pt denies LE related -pain. Pt reports she walked around the building with a PT where she lives and she had to stop twice. "I walked around it when I first moved there and didn't have to stop."    PERTINENT HISTORY: CHF, chronic , progressive lymphedema BLE, thyroid disease, Bradycardia, B knee OA, Periferal neuropathy, B carotid stenosis  PAIN:  Are you having pain? No: NPRS scale: 0/10 Pain location: B legs Pain description: sore, tender, heavy, tight, achy Aggravating factors: weight bearing Relieving factors: sitting, elevation, compression  PRECAUTIONS: Other: THYROID, B CAROTID ARTERY STENOSIS (NO strokes to lateral neck!) , CHF, BRADYCARDIA  FALLS:  Has patient fallen since last visit? No  HAND DOMINANCE: right   PRIOR LEVEL OF FUNCTION: Independent with basic ADLs, Independent with household mobility with device, Independent with community mobility with device, and Needs assistance with homemaking  PATIENT GOALS: "Because of the swelling, and I want to walk more. Ive gotten lazy about my walking.When I go to the vein doctor they wrapped my legs a couple of times, but I don't see them getting better."   OBJECTIVE:  OBSERVATIONS / OTHER ASSESSMENTS:   BLE COMPARATIVE LIMB VOLUMETRICS 10/16/22    LANDMARK  RIGHT  10/16/22   R LEG (A-D) 8223.8 ml  R THIGH (E-G)   R FULL LIMB (A-G)   Limb Volume differential (LVD)    Volume change since initial Increased 17.5% since last measured on 08/26/22%  Volume change overall Increased 8.6% since commencing OT on 07/31/22%  (Blank  rows = not tested)   LANDMARK  RIGHT  08/26/22 (9th visit)  R LEG (A-D) 6997.3 ml  R THIGH (E-G)   R FULL LIMB (A-G)   Limb Volume differential (LVD)    Volume change since initial Decreased 7.6%  Volume change overall   (Blank rows = not tested)  LANDMARK  INITIAL RIGHT  07/31/22  R LEG (A-D) 7570.2 ml  R THIGH (E-G)   R FULL LIMB (A-G)   Limb Volume differential (LVD)    Volume change since initial %  Volume change overall   (Blank rows = not tested)  LANDMARK INITIAL LEFT  07/31/22  R LEG (A-D) 7224.4 ml  R THIGH (E-G) ml  R FULL LIMB (A-G) ml  Limb Volume differential (LVD)  4.6%, L leg  > R leg  Volume change since initial %  Volume change overall %  (Blank rows = not tested)   LYMPHEDEMA LIFE IMPACT SCALE (LLIS): 41.18%  (The extent to which LE-related problems affected your life in the past week.)  FOTO Outcome Measure: 58%   TODAY'S TREATMENT:                                                                                                                                         There ex: 15 minutes on NuStep recumbent bike -no resistance- to facilitate increased lymphatic pumping and diaphragmatic breathing MLD with simultaneous skin care as established to RLE/RLQ Hybrid compression bilaterally using adjustable CircAid   leggings calibrated to 30-40 mmHg over single layer of Rosidal foam -Max A to don and doff due to time constraints    PATIENT EDUCATION:  Lymphedema self care home,e program components Education method: Explanation, Demonstration, Handouts, and needs additional edu Education comprehension: verbalized understanding, returned demonstration, and needs further education  LYMPHEDEMA SELF-CARE HOME PROGRAM 1.Therapeutic lymphatic pumping therex- 2 sets of 10 reps, hold 5 seconds; elements in order. 2 x daily PRN 2.  Full time, daily compression: Intensive Phase CDT: multilayer short stretch bandages from toes to popliteal using gradient techniques   Self-management Phase CDT: Attempt to use existing OTS, light duty adjustable leggings. Consider HOS devices  to limit fibrosis (Jobst Relax ccl 2) 3. Daily skin care to sustain optimal hydration. Wash bites, scratches, blisters, etc with mild soap and water , apply antibacterial first aide cream and a band aide. 4. Simple self-Manual lymphatic drainage (MLD) PRN.: 5 Pt will benefit from daily use of advanced sequential compression device- Flexitouch- during self-management phase of CDT to assist with regular lymphatic decongestion to limit progression as Pt has difficulty reaching her distal legs and feet to perform simple self-MLD.Pt has used a basic Biotab device daily greater than 1 year with significant physical and sensory symptoms of lymphedema remaining. 6. Full   time, daytime, Custom, Mediven, CircAid Juxtafit A-D bilaterally over liners calibrated to 30-40 mmHg.  Lymphedema Self- Care Instructions   1. EXERCISE: Perform lymphatic pumping there ex at least 2 x a day while wearing your compression wraps or garments. Perform 10 reps of each exercise bilaterally and be sure to perform them in order. Don't skip around!  OMIT PARTIAL SIT UPs.  2. MLD: Perform simple self-manual lymphatic drainage (MLD) at least once a day as directed. Take your time! Breathe! ;-)  3. If you have a Flexitouch advanced "pump" use it 1 time each day on a single limb only. The Flexitouch moves lymphatic fluid out of your affected body part and back to your heart, so DO NOT use the Flexi on 2 legs at a time, and DO NOT cues it on 2 legs on the same day. If you experience any atypical shortness of breath, sudden onset of pain, or feelings of heart arhythmia, or racing, discontinue use of the Flexitouch and report these symptoms to your doctor right away. Also, discontinue Flexi if you have an infection or a fever. It's OK to resume using the device 72 hours AFTER your first dose of oral antibiotic.   4. 4. WRAPS:  Compression wraps are to be worn 23 hrs/ 7 days/wk during Intensive Phase of Complete Decongestive Therapy (CDT).Building tolerance may take time and practice, so don't get discouraged. If bandages begin to feel tight during periods of inactivity and/or during the night, try performing your exercises to loosen them. Do not leave short stretch wraps in place for > 23 hours. It is very important that you remove all wraps daily to inspect skin, bathe and perform skin care before reapplying your wraps.  5. Daytime GARMENTS/ HOS DEVICES: During the Self-Management Phase CDT your compression garments are to be worn during waking hours when active. Do NOT sleep in your garments!!  Don daytime garments first thing in the morning. Do not wear your HOS devices all day instead of garments. These will not contain your swelling.  6. PUT YOUR FEET UP! Elevate your feet and legs and feet to the level of your heart whenever you are sitting down.   7. SKIN: Carefully monitor skin condition and perform impeccable hygiene daily. Bathe skin with mild soap and water and apply low pH lotion (aka Eucerin ) to improve   hydration and limit infection risk.    ASSESSMENT:  CLINICAL IMPRESSION:  Pt tolerated LLE/LLQ MLD without increased pain today. Pt's skin on her legs and feet is in excellent condition . She continues to be 100% compliant with skin care , and it is very apparent. Pt able to perform 15 minutes of recumbent bike therex on the NuStep at her own slow pace and without resistance before her L knee started hurting ands she had to stop. OT Re applied leggings over foam bilaterally to conserve clinic time. Cont as per POC. Cit LLE CircAid as soon as it arrives.  OBJECTIVE IMPAIRMENTS: decreased knowledge of condition, decreased knowledge of use of DME, decreased mobility, difficulty walking, decreased ROM, increased edema, obesity, and pain. Unfortunately Pt was not able to retain clinical gains achieved during first  10 or so visits du to inconsistent compression. Systemic fluid retention may also be overloading lymphatics at present. Fitted Pt w compression stocking alternatives that enable her to calibrate compression as well as don and doff with less physical effort. After skilled training and opportunities to practice, Pt is able to don the R CircAid and calibrate the compression   to 30-40 mmHg using the calibration cared provided by the manufacturer. Pt has used Velcro wrap style garment alternatives in the past, but she has not been able to apply them with consistent, medical grade compression. Plan is for Pt to use R CircAid daily between now and next session   to see if these help to reduce limb volume since they offer short stretch technology. If they do not , then we will resume multilayer wrapping to RLE to get  volume down before resuming CircAid to keep lymphatic swelling down.   ACTIVITY LIMITATIONS: decreased standing and walking tolerance > 30 minutes,  carrying, lifting, bending, extended dependent sitting, squatting, stairs, transfers, lower body bathing, LB grooming, LB dressing, productive, leisure and social participation requiring walking, standing and/ or dependent sitting >30 minutes ( attending social and community activities)  PERSONAL FACTORS: Age, time since onset, and 3+ co morbidities: CHF, OA, HTN are also affecting patient's functional outcome.   REHAB POTENTIAL: Good- with daily CG assistance with compression wraps during Intensive CDT. Poor without assistance with multilayer wraps  EVALUATION COMPLEXITY: moderate   GOALS: Goals reviewed with patient? Yes  SHORT TERM GOALS: Target date: 4th OT  Rx visit   Pt will demonstrate understanding of lymphedema precautions and prevention strategies with modified independence using a printed reference to identify at least 5 precautions and discussing how s/he may implement them into daily life to reduce risk of progression with modified  assistance. Baseline:Max A Goal status:08/21/22 GOAL MET  2.   With Max caregiver assistance Pt will be able to apply multilayer, knee length, compression wraps using gradient techniques to decrease limb volume, to limit infection risk, and to limit lymphedema progression.  Baseline:Max A Goal status: 08/07/22 GOAL MET w caregiver. 08/13/22 Goal exceeded with Pt modified independent with wraps to knee.  LONG TERM GOALS: Target date: 10/27/22  Given this patient's Intake score of 58 /100% on the functional outcomes FOTO tool, patient will experience an increase in function of 3 points to improve basic and instrumental ADLs performance, including lymphedema self-care. (TBA at first OT Rx visit) Baseline: Dependent Goal status: 07/29/22: ONGOING  2.  Given this patient's Intake score of 41.18% on the Lymphedema Life Impact Scale (LLIS), patient will experience a reduction of at least 5% in her perceived level of functional impairment   resulting from lymphedema to improve functional performance and quality of life (QOL). Baseline: Dependent Goal status: 07/29/22: ONGOING  3.  With modified assistance (extra time) Pt will be able to don and doff appropriate compression garments and/or devices using correct calibration  for ~ 20-30 mmHg  to control BLE lymphedema and to limit progression.  Baseline: Mod A Goal status: 07/29/22: ONGOING  4.  Pt will achieve at least a 10% volume reductions bilaterally below the knees to return limb to more typical size and shape, to limit infection risk and LE progression, to decrease pain, to improve function, and to improve body image and QOL. Baseline: Max A Goal status: 08/26/22: RLE: 7.6% volume reduction achieved in RLE measured on 9th visit.   10/16/22 RLE volume increased 17.5% during illness interval. Overall increase in R leg volume = 8.6%  5.  During Intensive phase CDT Pt will achieve at least 85% compliance with all lymphedema self-care home program components,  including  daily skin care, multilayer , gradient compression wraps with daily changes, daily simple self MLD and daily lymphatic pumping therex to achieve optimal clinical outcome and to habituate self care regime for optimal LE self-management over time. Baseline: Max A Goal status:07/29/22: ONGOING   PLAN:  PT FREQUENCY: 1 x/week, and PRN  PT DURATION: 12 weeks  PLANNED INTERVENTIONS: Complete Decongestive Therapy (CDT) Therapeutic exercises, Therapeutic activity, Patient/Family education, Self Care, DME instructions, Manual lymph drainage, Compression bandaging, and Manual therapy; fit with appropriate compression garments that control swelling and that Pt can don and doff          with extra time and/ or assistive devices; consider trial with advanced sequential pneumatic compression device (Flexitouch) trial which mimics proximal to distal MLD for optimal lymphatic return.  Multilayer compression bandaging below the knee to single leg at a time to limit falls risk. Utilizing one each 8, 10 and 12 cm wide x 5 meters long, short stretch   bandages applied in staggered gradient fashion over single layer of .04 cm thick Rosidal foam applied circumferentially  over cotton stockinett from toes to popliteal fossa.  MLD to RLE/RLQ utilizing modified short neck sequence utilizing strokes to clavicles only. ( NO STROKES TO LATERAL NECK BILATERALLY)  diaphragmatic breathing to activate deep abdominal lymphatics and thoracic duct, functional inguinal LNs, and proximal to distal J strokes to thigh, knee, leg, ankle and foot, then retrograde sweep to finish w/ modified short neck sequence.  PLAN FOR NEXT SESSION:  MLD to RLE w simultaneous skin care Knee length multilayer compression wraps vs CircAid?? Cont Pt edu for LE self  care  Jahzara Slattery, MS, OTR/L, CLT-LANA 11/14/22 11:58 AM 

## 2022-11-24 ENCOUNTER — Ambulatory Visit: Payer: Medicare Other | Admitting: Occupational Therapy

## 2022-11-25 ENCOUNTER — Ambulatory Visit: Payer: Medicare Other | Admitting: Occupational Therapy

## 2022-11-26 ENCOUNTER — Ambulatory Visit: Payer: Medicare Other | Admitting: Occupational Therapy

## 2022-11-27 ENCOUNTER — Ambulatory Visit: Payer: Medicare Other | Admitting: Occupational Therapy

## 2022-11-27 ENCOUNTER — Encounter: Payer: Self-pay | Admitting: Occupational Therapy

## 2022-11-27 DIAGNOSIS — I890 Lymphedema, not elsewhere classified: Secondary | ICD-10-CM

## 2022-11-27 NOTE — Therapy (Signed)
OUTPATIENT OCCUPATIONAL THERAPY TREATMENT NOTE LOWER EXTREMITY LYMPHEDEMA  Patient Name: Latoya Sloan MRN: [id number] DOB:1931-10-04, 89 y.o., female Today's Date: 11/14/2022  END OF SESSION:  OT End of Session - 11/14/22 1154     Visit Number 20    Number of Visits 36    Date for OT Re-Evaluation 02/12/23    OT Start Time 1100    OT Stop Time 1200    OT Time Calculation (min) 60 min    Activity Tolerance Patient tolerated treatment well;No increased pain    Behavior During Therapy WFL for tasks assessed/performed                    Past Medical History:  Diagnosis Date   Hypertension    Lymphedema of both lower extremities    Thyroid disease    History reviewed. No pertinent surgical history. Patient Active Problem List   Diagnosis Date Noted   Bradycardia 01/16/2021   Chronic diastolic CHF (congestive heart failure), NYHA class 2 (HCC) 04/06/2020   Leg numbness 03/20/2020   Leg swelling 03/20/2020   Lymphedema of both lower extremities 03/20/2020   Anosmia 03/06/2020   Bilateral carotid artery stenosis 03/06/2020   Chronic cough 03/06/2020   Dyspnea on exertion 03/06/2020   GERD (gastroesophageal reflux disease) 03/06/2020   Hyperlipidemia 03/06/2020   Lymphedema 03/06/2020   Migraines 03/06/2020   Osteoarthritis of both knees 03/06/2020   Idiopathic peripheral neuropathy 12/29/2013   Localized, primary osteoarthritis 12/28/2013   Osteoarthritis of knee 12/28/2013   Essential hypertension 10/23/2010   Family history of stroke 10/22/2010    PCP: Enid Baas, MD  REFERRING PROVIDER: Sheppard Plumber, NP  REFERRING DIAG: I89.0  THERAPY DIAG:  Lymphedema, not elsewhere classified  ONSET DATE: >10 yrs  SUBJECTIVE:                                                                                                                                                                                           SUBJECTIVE STATEMENT: [name] presents to  Occupational Therapy for BLE lymphedema wearing older, light duty, adjustable, Velcro, wrap style compression stocking alternatives bilaterally stating she is alternating the new CircAids with her older, less effective leggings because she doesn't want the new CircAids to get dirty. Pt denies LE related pain this morning. Pt has no new complaints. Recent injection in her R knee has given relief from arthritis pain. Pt denies LE related -pain. Pt reports she walked around the building with a PT where she lives and she had to stop twice. "I walked around it when I first moved there and didn't have to stop."  PERTINENT HISTORY: CHF, chronic , progressive lymphedema BLE, thyroid disease, Bradycardia, B knee OA, Periferal neuropathy, B carotid stenosis  PAIN:  Are you having pain? No: NPRS scale: 0/10 Pain location: B legs Pain description: sore, tender, heavy, tight, achy Aggravating factors: weight bearing Relieving factors: sitting, elevation, compression  PRECAUTIONS: Other: THYROID, B CAROTID ARTERY STENOSIS (NO strokes to lateral neck!) , CHF, BRADYCARDIA  FALLS:  Has patient fallen since last visit? No  HAND DOMINANCE: right   PRIOR LEVEL OF FUNCTION: Independent with basic ADLs, Independent with household mobility with device, Independent with community mobility with device, and Needs assistance with homemaking  PATIENT GOALS: "Because of the swelling, and I want to walk more. [name] gotten lazy about my walking.When I go to the vein doctor they wrapped my legs a couple of times, but I don't see them getting better."   OBJECTIVE:  OBSERVATIONS / OTHER ASSESSMENTS:   BLE COMPARATIVE LIMB VOLUMETRICS 10/16/22    LANDMARK  RIGHT  10/16/22   R LEG (A-D) 8223.8 ml  R THIGH (E-G)   R FULL LIMB (A-G)   Limb Volume differential (LVD)    Volume change since initial Increased 17.5% since last measured on 08/26/22%  Volume change overall Increased 8.6% since commencing OT on 07/31/22%  (Blank  rows = not tested)   LANDMARK  RIGHT  08/26/22 (9th visit)  R LEG (A-D) 6997.3 ml  R THIGH (E-G)   R FULL LIMB (A-G)   Limb Volume differential (LVD)    Volume change since initial Decreased 7.6%  Volume change overall   (Blank rows = not tested)  LANDMARK  INITIAL RIGHT  07/31/22  R LEG (A-D) 7570.2 ml  R THIGH (E-G)   R FULL LIMB (A-G)   Limb Volume differential (LVD)    Volume change since initial %  Volume change overall   (Blank rows = not tested)  LANDMARK INITIAL LEFT  07/31/22  R LEG (A-D) 7224.4 ml  R THIGH (E-G) ml  R FULL LIMB (A-G) ml  Limb Volume differential (LVD)  4.6%, L leg  > R leg  Volume change since initial %  Volume change overall %  (Blank rows = not tested)   LYMPHEDEMA LIFE IMPACT SCALE (LLIS): 41.18%  (The extent to which LE-related problems affected your life in the past week.)  FOTO Outcome Measure: 58%   TODAY'S TREATMENT:                                                                                                                                         There ex: 15 minutes on NuStep recumbent bike -no resistance- to facilitate increased lymphatic pumping and diaphragmatic breathing MLD with simultaneous skin care as established to RLE/RLQ Hybrid compression bilaterally using adjustable CircAid   leggings calibrated to 30-40 mmHg over single layer of Rosidal foam -Max A to don and doff due to time constraints  PATIENT EDUCATION:  Lymphedema self care home,e program components Education method: Explanation, Demonstration, Handouts, and needs additional edu Education comprehension: verbalized understanding, returned demonstration, and needs further education  LYMPHEDEMA SELF-CARE HOME PROGRAM 1.Therapeutic lymphatic pumping therex- 2 sets of 10 reps, hold 5 seconds; elements in order. 2 x daily PRN 2.  Full time, daily compression: Intensive Phase CDT: multilayer short stretch bandages from toes to popliteal using gradient techniques   Self-management Phase CDT: Attempt to use existing OTS, light duty adjustable leggings. Consider HOS devices  to limit fibrosis (Jobst Relax ccl 2) 3. Daily skin care to sustain optimal hydration. Wash bites, scratches, blisters, etc with mild soap and water , apply antibacterial first aide cream and a band aide. 4. Simple self-Manual lymphatic drainage (MLD) PRN.: 5 Pt will benefit from daily use of advanced sequential compression device- Flexitouch- during self-management phase of CDT to assist with regular lymphatic decongestion to limit progression as Pt has difficulty reaching her distal legs and feet to perform simple self-MLD.Pt has used a basic Biotab device daily greater than 1 year with significant physical and sensory symptoms of lymphedema remaining. 6. Full   time, daytime, Custom, Mediven, CircAid Juxtafit A-D bilaterally over liners calibrated to 30-40 mmHg.  Lymphedema Self- Care Instructions   1. EXERCISE: Perform lymphatic pumping there ex at least 2 x a day while wearing your compression wraps or garments. Perform 10 reps of each exercise bilaterally and be sure to perform them in order. Don't skip around!  OMIT PARTIAL SIT UPs.  2. MLD: Perform simple self-manual lymphatic drainage (MLD) at least once a day as directed. Take your time! Breathe! ;-)  3. If you have a Flexitouch advanced "pump" use it 1 time each day on a single limb only. The Flexitouch moves lymphatic fluid out of your affected body part and back to your heart, so DO NOT use the Flexi on 2 legs at a time, and DO NOT cues it on 2 legs on the same day. If you experience any atypical shortness of breath, sudden onset of pain, or feelings of heart arhythmia, or racing, discontinue use of the Flexitouch and report these symptoms to your doctor right away. Also, discontinue Flexi if you have an infection or a fever. It's OK to resume using the device 72 hours AFTER your first dose of oral antibiotic.   4. 4. WRAPS:  Compression wraps are to be worn 23 hrs/ 7 days/wk during Intensive Phase of Complete Decongestive Therapy (CDT).Building tolerance may take time and practice, so don't get discouraged. If bandages begin to feel tight during periods of inactivity and/or during the night, try performing your exercises to loosen them. Do not leave short stretch wraps in place for > 23 hours. It is very important that you remove all wraps daily to inspect skin, bathe and perform skin care before reapplying your wraps.  5. Daytime GARMENTS/ HOS DEVICES: During the Self-Management Phase CDT your compression garments are to be worn during waking hours when active. Do NOT sleep in your garments!!  Don daytime garments first thing in the morning. Do not wear your HOS devices all day instead of garments. These will not contain your swelling.  6. PUT YOUR FEET UP! Elevate your feet and legs and feet to the level of your heart whenever you are sitting down.   7. SKIN: Carefully monitor skin condition and perform impeccable hygiene daily. Bathe skin with mild soap and water and apply low pH lotion (aka Eucerin ) to improve  hydration and limit infection risk.    ASSESSMENT:  CLINICAL IMPRESSION:  Pt tolerated LLE/LLQ MLD without increased pain today. Pt's skin on her legs and feet is in excellent condition . She continues to be 100% compliant with skin care , and it is very apparent. Pt able to perform 15 minutes of recumbent bike therex on the NuStep at her own slow pace and without resistance before her L knee started hurting ands she had to stop. OT Re applied leggings over foam bilaterally to conserve clinic time. Cont as per POC. Cit LLE CircAid as soon as it arrives.  OBJECTIVE IMPAIRMENTS: decreased knowledge of condition, decreased knowledge of use of DME, decreased mobility, difficulty walking, decreased ROM, increased edema, obesity, and pain. Unfortunately Pt was not able to retain clinical gains achieved during first  10 or so visits du to inconsistent compression. Systemic fluid retention may also be overloading lymphatics at present. Fitted Pt w compression stocking alternatives that enable her to calibrate compression as well as don and doff with less physical effort. After skilled training and opportunities to practice, Pt is able to don the R CircAid and calibrate the compression   to 30-40 mmHg using the calibration cared provided by the manufacturer. Pt has used Velcro wrap style garment alternatives in the past, but she has not been able to apply them with consistent, medical grade compression. Plan is for Pt to use R CircAid daily between now and next session   to see if these help to reduce limb volume since they offer short stretch technology. If they do not , then we will resume multilayer wrapping to RLE to get  volume down before resuming CircAid to keep lymphatic swelling down.   ACTIVITY LIMITATIONS: decreased standing and walking tolerance > 30 minutes,  carrying, lifting, bending, extended dependent sitting, squatting, stairs, transfers, lower body bathing, LB grooming, LB dressing, productive, leisure and social participation requiring walking, standing and/ or dependent sitting >30 minutes ( attending social and community activities)  PERSONAL FACTORS: Age, time since onset, and 3+ co morbidities: CHF, OA, HTN are also affecting patient's functional outcome.   REHAB POTENTIAL: Good- with daily CG assistance with compression wraps during Intensive CDT. Poor without assistance with multilayer wraps  EVALUATION COMPLEXITY: moderate   GOALS: Goals reviewed with patient? Yes  SHORT TERM GOALS: Target date: 4th OT  Rx visit   Pt will demonstrate understanding of lymphedema precautions and prevention strategies with modified independence using a printed reference to identify at least 5 precautions and discussing how s/he may implement them into daily life to reduce risk of progression with modified  assistance. Baseline:Max A Goal status:08/21/22 GOAL MET  2.   With Max caregiver assistance Pt will be able to apply multilayer, knee length, compression wraps using gradient techniques to decrease limb volume, to limit infection risk, and to limit lymphedema progression.  Baseline:Max A Goal status: 08/07/22 GOAL MET w caregiver. 08/13/22 Goal exceeded with Pt modified independent with wraps to knee.  LONG TERM GOALS: Target date: 10/27/22  Given this patient's Intake score of 58 /100% on the functional outcomes FOTO tool, patient will experience an increase in function of 3 points to improve basic and instrumental ADLs performance, including lymphedema self-care. (TBA at first OT Rx visit) Baseline: Dependent Goal status: 07/29/22: ONGOING  2.  Given this patient's Intake score of 41.18% on the Lymphedema Life Impact Scale (LLIS), patient will experience a reduction of at least 5% in her perceived level of functional impairment  resulting from lymphedema to improve functional performance and quality of life (QOL). Baseline: Dependent Goal status: 07/29/22: ONGOING  3.  With modified assistance (extra time) Pt will be able to don and doff appropriate compression garments and/or devices using correct calibration  for ~ 20-30 mmHg  to control BLE lymphedema and to limit progression.  Baseline: Mod A Goal status: 07/29/22: ONGOING  4.  Pt will achieve at least a 10% volume reductions bilaterally below the knees to return limb to more typical size and shape, to limit infection risk and LE progression, to decrease pain, to improve function, and to improve body image and QOL. Baseline: Max A Goal status: 08/26/22: RLE: 7.6% volume reduction achieved in RLE measured on 9th visit.   10/16/22 RLE volume increased 17.5% during illness interval. Overall increase in R leg volume = 8.6%  5.  During Intensive phase CDT Pt will achieve at least 85% compliance with all lymphedema self-care home program components,  including  daily skin care, multilayer , gradient compression wraps with daily changes, daily simple self MLD and daily lymphatic pumping therex to achieve optimal clinical outcome and to habituate self care regime for optimal LE self-management over time. Baseline: Max A Goal status:07/29/22: ONGOING   PLAN:  PT FREQUENCY: 1 x/week, and PRN  PT DURATION: 12 weeks  PLANNED INTERVENTIONS: Complete Decongestive Therapy (CDT) Therapeutic exercises, Therapeutic activity, Patient/Family education, Self Care, DME instructions, Manual lymph drainage, Compression bandaging, and Manual therapy; fit with appropriate compression garments that control swelling and that Pt can don and doff          with extra time and/ or assistive devices; consider trial with advanced sequential pneumatic compression device (Flexitouch) trial which mimics proximal to distal MLD for optimal lymphatic return.  Multilayer compression bandaging below the knee to single leg at a time to limit falls risk. Utilizing one each 8, 10 and 12 cm wide x 5 meters long, short stretch   bandages applied in staggered gradient fashion over single layer of .04 cm thick Rosidal foam applied circumferentially  over cotton stockinett from toes to popliteal fossa.  MLD to RLE/RLQ utilizing modified short neck sequence utilizing strokes to clavicles only. ( NO STROKES TO LATERAL NECK BILATERALLY)  diaphragmatic breathing to activate deep abdominal lymphatics and thoracic duct, functional inguinal LNs, and proximal to distal J strokes to thigh, knee, leg, ankle and foot, then retrograde sweep to finish w/ modified short neck sequence.  PLAN FOR NEXT SESSION:  MLD to RLE w simultaneous skin care Knee length multilayer compression wraps vs CircAid?? Cont Pt edu for LE self  care  Loel Dubonnet, MS, OTR/L, CLT-LANA 11/14/22 11:58 AM

## 2022-12-02 ENCOUNTER — Ambulatory Visit: Payer: Medicare Other | Admitting: Occupational Therapy

## 2022-12-02 DIAGNOSIS — I890 Lymphedema, not elsewhere classified: Secondary | ICD-10-CM | POA: Diagnosis not present

## 2022-12-02 NOTE — Therapy (Signed)
OUTPATIENT OCCUPATIONAL THERAPY TREATMENT NOTE LOWER EXTREMITY LYMPHEDEMA  Patient Name: Latoya Sloan MRN: [id number] DOB:1932/02/05, 89 y.o., female Today's Date: 12/02/2022  END OF SESSION:  OT End of Session - 11/14/22 1154     Visit Number 20    Number of Visits 36    Date for OT Re-Evaluation 02/12/23    OT Start Time 1105   OT Stop Time 1205   OT Time Calculation (min) 60 min    Activity Tolerance Patient tolerated treatment well;No increased pain    Behavior During Therapy WFL for tasks assessed/performed                    Past Medical History:  Diagnosis Date   Hypertension    Lymphedema of both lower extremities    Thyroid disease    History reviewed. No pertinent surgical history. Patient Active Problem List   Diagnosis Date Noted   Bradycardia 01/16/2021   Chronic diastolic CHF (congestive heart failure), NYHA class 2 (HCC) 04/06/2020   Leg numbness 03/20/2020   Leg swelling 03/20/2020   Lymphedema of both lower extremities 03/20/2020   Anosmia 03/06/2020   Bilateral carotid artery stenosis 03/06/2020   Chronic cough 03/06/2020   Dyspnea on exertion 03/06/2020   GERD (gastroesophageal reflux disease) 03/06/2020   Hyperlipidemia 03/06/2020   Lymphedema 03/06/2020   Migraines 03/06/2020   Osteoarthritis of both knees 03/06/2020   Idiopathic peripheral neuropathy 12/29/2013   Localized, primary osteoarthritis 12/28/2013   Osteoarthritis of knee 12/28/2013   Essential hypertension 10/23/2010   Family history of stroke 10/22/2010    PCP: Enid Baas, MD  REFERRING PROVIDER: Sheppard Plumber, NP  REFERRING DIAG: I89.0  THERAPY DIAG:  Lymphedema, not elsewhere classified  ONSET DATE: >10 yrs  SUBJECTIVE:                                                                                                                                                                                           SUBJECTIVE STATEMENT: Latoya Sloan presents to  Occupational Therapy for BLE lymphedema wearing older, light duty, adjustable, Velcro, wrap style compression stocking alternatives bilaterally stating she is alternating the new CircAids with her older, less effective leggings because she doesn't want the new CircAids to get dirty. Pt denies LE related pain this morning. Pt has no new complaints.   PERTINENT HISTORY: CHF, chronic , progressive lymphedema BLE, thyroid disease, Bradycardia, B knee OA, Periferal neuropathy, B carotid stenosis  PAIN:  Are you having pain? No: NPRS scale: 0/10 Pain location: B legs Pain description: sore, tender, heavy, tight, achy Aggravating factors: weight bearing Relieving factors: sitting, elevation, compression  PRECAUTIONS: Other: THYROID, B CAROTID ARTERY STENOSIS (NO strokes to lateral neck!) , CHF, BRADYCARDIA  FALLS:  Has patient fallen since last visit? No  HAND DOMINANCE: right   PRIOR LEVEL OF FUNCTION: Independent with basic ADLs, Independent with household mobility with device, Independent with community mobility with device, and Needs assistance with homemaking  PATIENT GOALS: "Because of the swelling, and I want to walk more. [name] gotten lazy about my walking.When I go to the vein doctor they wrapped my legs a couple of times, but I don't see them getting better."   OBJECTIVE:  OBSERVATIONS / OTHER ASSESSMENTS:   BLE COMPARATIVE LIMB VOLUMETRICS 10/16/22    LANDMARK  RIGHT  10/16/22   R LEG (A-D) 8223.8 ml  R THIGH (E-G)   R FULL LIMB (A-G)   Limb Volume differential (LVD)    Volume change since initial Increased 17.5% since last measured on 08/26/22%  Volume change overall Increased 8.6% since commencing OT on 07/31/22%  (Blank rows = not tested)   LANDMARK  RIGHT  08/26/22 (9th visit)  R LEG (A-D) 6997.3 ml  R THIGH (E-G)   R FULL LIMB (A-G)   Limb Volume differential (LVD)    Volume change since initial Decreased 7.6%  Volume change overall   (Blank rows = not  tested)  LANDMARK  INITIAL RIGHT  07/31/22  R LEG (A-D) 7570.2 ml  R THIGH (E-G)   R FULL LIMB (A-G)   Limb Volume differential (LVD)    Volume change since initial %  Volume change overall   (Blank rows = not tested)  LANDMARK INITIAL LEFT  07/31/22  R LEG (A-D) 7224.4 ml  R THIGH (E-G) ml  R FULL LIMB (A-G) ml  Limb Volume differential (LVD)  4.6%, L leg  > R leg  Volume change since initial %  Volume change overall %  (Blank rows = not tested)   LYMPHEDEMA LIFE IMPACT SCALE (LLIS): 41.18%  (The extent to which LE-related problems affected your life in the past week.)  FOTO Outcome Measure: 58%   TODAY'S TREATMENT:                                                                                                MLD with simultaneous skin care as established to RLE/RLQ Hybrid compression bilaterally using adjustable CircAid   leggings calibrated to 30-40 mmHg over single layer of Rosidal foam -Max A to don and doff due to time constraints  PATIENT EDUCATION:  Lymphedema self care home,e program components Education method: Explanation, Demonstration, Handouts, and needs additional edu Education comprehension: verbalized understanding, returned demonstration, and needs further education  LYMPHEDEMA SELF-CARE HOME PROGRAM 1.Therapeutic lymphatic pumping therex- 2 sets of 10 reps, hold 5 seconds; elements in order. 2 x daily PRN 2.  Full time, daily compression: Intensive Phase CDT: multilayer short stretch bandages from toes to popliteal using gradient techniques  Self-management Phase CDT: Attempt to use existing OTS, light duty adjustable leggings. Consider HOS devices  to limit fibrosis (Jobst Relax ccl 2) 3. Daily skin care to sustain optimal hydration. Wash bites, scratches, blisters, etc  with mild soap and water , apply antibacterial first aide cream and a band aide. 4. Simple self-Manual lymphatic drainage (MLD) PRN.: 5 Pt will benefit from daily use of advanced sequential  compression device- Flexitouch- during self-management phase of CDT to assist with regular lymphatic decongestion to limit progression as Pt has difficulty reaching her distal legs and feet to perform simple self-MLD.Pt has used a basic Biotab device daily greater than 1 year with significant physical and sensory symptoms of lymphedema remaining. 6. Full   time, daytime, Custom, Mediven, CircAid Juxtafit A-D bilaterally over liners calibrated to 30-40 mmHg.  Lymphedema Self- Care Instructions   1. EXERCISE: Perform lymphatic pumping there ex at least 2 x a day while wearing your compression wraps or garments. Perform 10 reps of each exercise bilaterally and be sure to perform them in order. Don't skip around!  OMIT PARTIAL SIT UPs.  2. MLD: Perform simple self-manual lymphatic drainage (MLD) at least once a day as directed. Take your time! Breathe! ;-)  3. If you have a Flexitouch advanced "pump" use it 1 time each day on a single limb only. The Flexitouch moves lymphatic fluid out of your affected body part and back to your heart, so DO NOT use the Flexi on 2 legs at a time, and DO NOT cues it on 2 legs on the same day. If you experience any atypical shortness of breath, sudden onset of pain, or feelings of heart arhythmia, or racing, discontinue use of the Flexitouch and report these symptoms to your doctor right away. Also, discontinue Flexi if you have an infection or a fever. It's OK to resume using the device 72 hours AFTER your first dose of oral antibiotic.   4. 4. WRAPS: Compression wraps are to be worn 23 hrs/ 7 days/wk during Intensive Phase of Complete Decongestive Therapy (CDT).Building tolerance may take time and practice, so don't get discouraged. If bandages begin to feel tight during periods of inactivity and/or during the night, try performing your exercises to loosen them. Do not leave short stretch wraps in place for > 23 hours. It is very important that you remove all wraps daily to  inspect skin, bathe and perform skin care before reapplying your wraps.  5. Daytime GARMENTS/ HOS DEVICES: During the Self-Management Phase CDT your compression garments are to be worn during waking hours when active. Do NOT sleep in your garments!!  Don daytime garments first thing in the morning. Do not wear your HOS devices all day instead of garments. These will not contain your swelling.  6. PUT YOUR FEET UP! Elevate your feet and legs and feet to the level of your heart whenever you are sitting down.   7. SKIN: Carefully monitor skin condition and perform impeccable hygiene daily. Bathe skin with mild soap and water and apply low pH lotion (aka Eucerin ) to improve hydration and limit infection risk.    ASSESSMENT:  CLINICAL IMPRESSION:  Pt tolerated LLE/LLQ MLD without increased pain today. Pt's skin on her legs and feet is in excellent condition . She continues to be 100% compliant with skin care , and it is very apparent. Pt able to perform 15 minutes of recumbent bike therex on the NuStep at her own slow pace and without resistance before her L knee started hurting ands she had to stop. OT Re applied leggings over foam bilaterally to conserve clinic time. Cont as per POC. Cit LLE CircAid as soon as it arrives.  OBJECTIVE IMPAIRMENTS: decreased knowledge of  condition, decreased knowledge of use of DME, decreased mobility, difficulty walking, decreased ROM, increased edema, obesity, and pain. Unfortunately Pt was not able to retain clinical gains achieved during first 10 or so visits du to inconsistent compression. Systemic fluid retention may also be overloading lymphatics at present. Fitted Pt w compression stocking alternatives that enable her to calibrate compression as well as don and doff with less physical effort. After skilled training and opportunities to practice, Pt is able to don the R CircAid and calibrate the compression   to 30-40 mmHg using the calibration cared provided by the  manufacturer. Pt has used Velcro wrap style garment alternatives in the past, but she has not been able to apply them with consistent, medical grade compression. Plan is for Pt to use R CircAid daily between now and next session   to see if these help to reduce limb volume since they offer short stretch technology. If they do not , then we will resume multilayer wrapping to RLE to get  volume down before resuming CircAid to keep lymphatic swelling down.   ACTIVITY LIMITATIONS: decreased standing and walking tolerance > 30 minutes,  carrying, lifting, bending, extended dependent sitting, squatting, stairs, transfers, lower body bathing, LB grooming, LB dressing, productive, leisure and social participation requiring walking, standing and/ or dependent sitting >30 minutes ( attending social and community activities)  PERSONAL FACTORS: Age, time since onset, and 3+ co morbidities: CHF, OA, HTN are also affecting patient's functional outcome.   REHAB POTENTIAL: Good- with daily CG assistance with compression wraps during Intensive CDT. Poor without assistance with multilayer wraps  EVALUATION COMPLEXITY: moderate   GOALS: Goals reviewed with patient? Yes  SHORT TERM GOALS: Target date: 4th OT  Rx visit   Pt will demonstrate understanding of lymphedema precautions and prevention strategies with modified independence using a printed reference to identify at least 5 precautions and discussing how s/he may implement them into daily life to reduce risk of progression with modified assistance. Baseline:Max A Goal status:08/21/22 GOAL MET  2.   With Max caregiver assistance Pt will be able to apply multilayer, knee length, compression wraps using gradient techniques to decrease limb volume, to limit infection risk, and to limit lymphedema progression.  Baseline:Max A Goal status: 08/07/22 GOAL MET w caregiver. 08/13/22 Goal exceeded with Pt modified independent with wraps to knee.  LONG TERM GOALS: Target  date: 10/27/22  Given this patient's Intake score of 58 /100% on the functional outcomes FOTO tool, patient will experience an increase in function of 3 points to improve basic and instrumental ADLs performance, including lymphedema self-care. (TBA at first OT Rx visit) Baseline: Dependent Goal status: 07/29/22: ONGOING  2.  Given this patient's Intake score of 41.18% on the Lymphedema Life Impact Scale (LLIS), patient will experience a reduction of at least 5% in her perceived level of functional impairment resulting from lymphedema to improve functional performance and quality of life (QOL). Baseline: Dependent Goal status: 07/29/22: ONGOING  3.  With modified assistance (extra time) Pt will be able to don and doff appropriate compression garments and/or devices using correct calibration  for ~ 20-30 mmHg  to control BLE lymphedema and to limit progression.  Baseline: Mod A Goal status: 07/29/22: ONGOING  4.  Pt will achieve at least a 10% volume reductions bilaterally below the knees to return limb to more typical size and shape, to limit infection risk and LE progression, to decrease pain, to improve function, and to improve body image and QOL.  Baseline: Max A Goal status: 08/26/22: RLE: 7.6% volume reduction achieved in RLE measured on 9th visit.   10/16/22 RLE volume increased 17.5% during illness interval. Overall increase in R leg volume = 8.6%  5.  During Intensive phase CDT Pt will achieve at least 85% compliance with all lymphedema self-care home program components, including  daily skin care, multilayer , gradient compression wraps with daily changes, daily simple self MLD and daily lymphatic pumping therex to achieve optimal clinical outcome and to habituate self care regime for optimal LE self-management over time. Baseline: Max A Goal status:07/29/22: ONGOING   PLAN:  PT FREQUENCY: 1 x/week, and PRN  PT DURATION: 12 weeks  PLANNED INTERVENTIONS: Complete Decongestive Therapy  (CDT) Therapeutic exercises, Therapeutic activity, Patient/Family education, Self Care, DME instructions, Manual lymph drainage, Compression bandaging, and Manual therapy; fit with appropriate compression garments that control swelling and that Pt can don and doff          with extra time and/ or assistive devices; consider trial with advanced sequential pneumatic compression device (Flexitouch) trial which mimics proximal to distal MLD for optimal lymphatic return.  Multilayer compression bandaging below the knee to single leg at a time to limit falls risk. Utilizing one each 8, 10 and 12 cm wide x 5 meters long, short stretch   bandages applied in staggered gradient fashion over single layer of .04 cm thick Rosidal foam applied circumferentially  over cotton stockinett from toes to popliteal fossa.  MLD to RLE/RLQ utilizing modified short neck sequence utilizing strokes to clavicles only. ( NO STROKES TO LATERAL NECK BILATERALLY)  diaphragmatic breathing to activate deep abdominal lymphatics and thoracic duct, functional inguinal LNs, and proximal to distal J strokes to thigh, knee, leg, ankle and foot, then retrograde sweep to finish w/ modified short neck sequence.  PLAN FOR NEXT SESSION:  MLD to RLE w simultaneous skin care Knee length multilayer compression wraps vs CircAid?? Cont Pt edu for LE self  care  Loel Dubonnet, MS, OTR/L, CLT-LANA 11/14/22 11:58 AM

## 2022-12-04 ENCOUNTER — Ambulatory Visit: Payer: Medicare Other | Admitting: Occupational Therapy

## 2022-12-08 ENCOUNTER — Ambulatory Visit: Payer: Medicare Other | Admitting: Occupational Therapy

## 2022-12-09 ENCOUNTER — Ambulatory Visit: Payer: Medicare Other | Admitting: Occupational Therapy

## 2022-12-10 ENCOUNTER — Ambulatory Visit: Payer: Medicare Other | Admitting: Occupational Therapy

## 2022-12-11 ENCOUNTER — Ambulatory Visit: Payer: Medicare Other | Admitting: Occupational Therapy

## 2022-12-11 ENCOUNTER — Encounter (INDEPENDENT_AMBULATORY_CARE_PROVIDER_SITE_OTHER): Payer: Self-pay | Admitting: Nurse Practitioner

## 2022-12-11 ENCOUNTER — Ambulatory Visit (INDEPENDENT_AMBULATORY_CARE_PROVIDER_SITE_OTHER): Payer: Medicare Other | Admitting: Nurse Practitioner

## 2022-12-11 VITALS — BP 152/67 | HR 64 | Resp 18 | Ht 66.0 in | Wt 257.2 lb

## 2022-12-11 DIAGNOSIS — I10 Essential (primary) hypertension: Secondary | ICD-10-CM

## 2022-12-11 DIAGNOSIS — I890 Lymphedema, not elsewhere classified: Secondary | ICD-10-CM | POA: Diagnosis not present

## 2022-12-11 DIAGNOSIS — E785 Hyperlipidemia, unspecified: Secondary | ICD-10-CM

## 2022-12-11 NOTE — Progress Notes (Signed)
Subjective:    Patient ID: Latoya Sloan, female    DOB: 25-May-1932, 89 y.o.   MRN: [id number] Chief Complaint  Patient presents with   Follow-up    The patient returns to the office for followup evaluation regarding leg swelling.  The swelling has improved quite a bit and the pain associated with swelling has decreased . There have not been any interval development of a ulcerations or wounds.  She is working with the lymphedema clinic and it has been very positive for her overall lymphedema.  Since the previous visit the patient has been wearing graduated compression stockings and has noted some improvement in the lymphedema. The patient has been using compression routinely morning until night.  Following work with the lymphedema clinic they have also recommended a lymphedema pump in order to gain better control of her swelling.  The patient also states elevation during the day and exercise (such as walking) is being done too.       Review of Systems  Cardiovascular:  Positive for leg swelling.  All other systems reviewed and are negative.      Objective:   Physical Exam Vitals reviewed.  HENT:     Head: Normocephalic.  Cardiovascular:     Rate and Rhythm: Normal rate.     Pulses: Normal pulses.  Pulmonary:     Effort: Pulmonary effort is normal.  Musculoskeletal:     Right lower leg: 2+ Edema present.     Left lower leg: 2+ Edema present.  Skin:    General: Skin is warm and dry.  Neurological:     Mental Status: She is alert and oriented to person, place, and time.     Gait: Gait abnormal.  Psychiatric:        Mood and Affect: Mood normal.        Behavior: Behavior normal.        Thought Content: Thought content normal.        Judgment: Judgment normal.     BP (!) 152/67 (BP Location: Right Arm)   Pulse 64   Resp 18   Ht 5\' 6"  (1.676 m)   Wt 257 lb 3.2 oz (116.7 kg)   BMI 41.51 kg/m   Past Medical History:  Diagnosis Date   Hypertension    Lymphedema  of both lower extremities    Thyroid disease     Social History   Socioeconomic History   Marital status: Widowed    Spouse name: Not on file   Number of children: Not on file   Years of education: Not on file   Highest education level: Not on file  Occupational History   Not on file  Tobacco Use   Smoking status: Never   Smokeless tobacco: Never  Substance and Sexual Activity   Alcohol use: Not Currently   Drug use: Not Currently   Sexual activity: Not on file  Other Topics Concern   Not on file  Social History Narrative   Not on file   Social Determinants of Health   Financial Resource Strain: Not on file  Food Insecurity: Not on file  Transportation Needs: Not on file  Physical Activity: Not on file  Stress: Not on file  Social Connections: Not on file  Intimate Partner Violence: Not on file    History reviewed. No pertinent surgical history.  History reviewed. No pertinent family history.  Allergies  Allergen Reactions   Hydrochlorothiazide     Other Reaction(s): Other  Possible HCTZ-induced  pancreatitis   Aspirin     Other Reaction(s): Not available       Latest Ref Rng & Units 04/06/2022    3:41 PM 09/12/2021    8:51 AM 04/07/2021   11:53 AM  CBC  WBC 4.0 - 10.5 K/uL 5.6  8.2  6.1   Hemoglobin 12.0 - 15.0 g/dL 16.1  09.6  04.5   Hematocrit 36.0 - 46.0 % 35.0  36.7  36.8   Platelets 150 - 400 K/uL 216  190  218       CMP     Component Value Date/Time   NA 143 04/06/2022 1541   K 4.2 04/06/2022 1541   CL 108 04/06/2022 1541   CO2 28 04/06/2022 1541   GLUCOSE 93 04/06/2022 1541   BUN 31 (H) 04/06/2022 1541   CREATININE 1.26 (H) 04/06/2022 1541   CALCIUM 9.2 04/06/2022 1541   PROT 7.1 04/06/2022 1541   ALBUMIN 3.9 04/06/2022 1541   AST 18 04/06/2022 1541   ALT 16 04/06/2022 1541   ALKPHOS 78 04/06/2022 1541   BILITOT 1.1 04/06/2022 1541   GFRNONAA 41 (L) 04/06/2022 1541     No results found.     Assessment & Plan:   1.  Lymphedema of both lower extremities Recommend:  No surgery or intervention at this point in time.   The Patient is CEAP [id number].  The patient has been wearing compression for more than 12 weeks with no or little benefit.  The patient has been exercising daily for more than 12 weeks. The patient has been elevating and taking OTC pain medications for more than 12 weeks.  None of these have have eliminated the pain related to the lymphedema or the discomfort regarding excessive swelling and venous congestion.    I have reviewed my discussion with the patient regarding lymphedema and why it  causes symptoms.  Patient will continue wearing graduated compression on a daily basis. The patient should put the compression on first thing in the morning and removing them in the evening. The patient should not sleep in the compression.   In addition, behavioral modification throughout the day will be continued.  This will include frequent elevation (such as in a recliner), use of over the counter pain medications as needed and exercise such as walking.  The systemic causes for chronic edema such as liver, kidney and cardiac etiologies do not appear to have significant changed over the past year.    The patient has chronic , severe lymphedema with hyperpigmentation of the skin and has done MLD, skin care, medication, diet, exercise, elevation and compression for 4 weeks with no improvement,  I am recommending a lymphedema pump.  The patient still has stage 3 lymphedema and therefore, I believe that a lymph pump is needed to improve the control of the patient's lymphedema and improve the quality of life.  Additionally, a lymph pump is warranted because it will reduce the risk of cellulitis and ulceration in the future.  Patient should follow-up in six months   2. Hyperlipidemia, unspecified hyperlipidemia type Continue statin as ordered and reviewed, no changes at this time  3. Essential  hypertension Continue antihypertensive medications as already ordered, these medications have been reviewed and there are no changes at this time.   Current Outpatient Medications on File Prior to Visit  Medication Sig Dispense Refill   acetaminophen (TYLENOL) 325 MG tablet Take by mouth.     aspirin 81 MG chewable tablet Chew 81 mg by  mouth daily.     atorvastatin (LIPITOR) 40 MG tablet Take 40 mg by mouth daily.     carvedilol (COREG) 6.25 MG tablet Take by mouth.     Cholecalciferol 50 MCG (2000 UT) CAPS Take 2,000 Units by mouth daily.     furosemide (LASIX) 20 MG tablet Take by mouth.     irbesartan-hydrochlorothiazide (AVALIDE) 150-12.5 MG tablet Take 1 tablet by mouth daily.     ketoconazole (NIZORAL) 2 % cream APPLY CREAM TOPICALLY EVERY DAY AS DIRECTED     levothyroxine (SYNTHROID) 25 MCG tablet Take 25 mcg by mouth daily.     losartan (COZAAR) 100 MG tablet Take 100 mg by mouth daily.     potassium chloride SA (KLOR-CON M) 20 MEQ tablet Take by mouth.     senna-docusate (SENOKOT-S) 8.6-50 MG tablet Take 2 tablets by mouth 2 (two) times daily. 120 tablet 0   benzonatate (TESSALON PERLES) 100 MG capsule Take 1-2 tabs TID prn cough (Patient not taking: Reported on 12/11/2022) 30 capsule 0   bisacodyl (DULCOLAX) 10 MG suppository Place 1 suppository (10 mg total) rectally as needed for moderate constipation. (Patient not taking: Reported on 12/11/2022) 12 suppository 0   furosemide (LASIX) 20 MG tablet Take by mouth.     multivitamin-iron-minerals-folic acid (CENTRUM) chewable tablet Chew 1 tablet by mouth daily. (Patient not taking: Reported on 12/11/2022)     omeprazole (PRILOSEC) 40 MG capsule Take 40 mg by mouth daily. (Patient not taking: Reported on 12/11/2022)     polyethylene glycol powder (GLYCOLAX/MIRALAX) 17 GM/SCOOP powder 1 cap full in a full glass of water, two times a day for 3 days. (Patient not taking: Reported on 12/11/2022) 255 g 0   No current facility-administered  medications on file prior to visit.    There are no Patient Instructions on file for this visit. No follow-ups on file.   Georgiana Spinner, NP

## 2022-12-15 ENCOUNTER — Ambulatory Visit: Payer: Medicare Other | Admitting: Occupational Therapy

## 2022-12-16 ENCOUNTER — Ambulatory Visit: Payer: Medicare Other | Admitting: Occupational Therapy

## 2022-12-17 ENCOUNTER — Ambulatory Visit: Payer: Medicare Other | Admitting: Occupational Therapy

## 2022-12-18 ENCOUNTER — Ambulatory Visit: Payer: Medicare Other | Admitting: Occupational Therapy

## 2022-12-19 ENCOUNTER — Encounter: Payer: Self-pay | Admitting: Occupational Therapy

## 2022-12-19 ENCOUNTER — Ambulatory Visit: Payer: Medicare Other | Attending: Nurse Practitioner | Admitting: Occupational Therapy

## 2022-12-19 DIAGNOSIS — I890 Lymphedema, not elsewhere classified: Secondary | ICD-10-CM | POA: Insufficient documentation

## 2022-12-19 NOTE — Therapy (Signed)
OUTPATIENT OCCUPATIONAL THERAPY TREATMENT NOTE LOWER EXTREMITY LYMPHEDEMA  Patient Name: Latoya Sloan MRN: [id number] DOB:1931/10/01, 89 y.o., female Today's Date: 12/02/2022  END OF SESSION:   OT End of Session - 12/19/22 1101     Visit Number 24    Number of Visits 36    Date for OT Re-Evaluation 02/12/23    OT Start Time 1100    OT Stop Time 1155    OT Time Calculation (min) 55 min    Activity Tolerance Patient tolerated treatment well;No increased pain    Behavior During Therapy WFL for tasks assessed/performed                  Past Medical History:  Diagnosis Date   Hypertension    Lymphedema of both lower extremities    Thyroid disease    History reviewed. No pertinent surgical history. Patient Active Problem List   Diagnosis Date Noted   Bradycardia 01/16/2021   Chronic diastolic CHF (congestive heart failure), NYHA class 2 (HCC) 04/06/2020   Leg numbness 03/20/2020   Leg swelling 03/20/2020   Lymphedema of both lower extremities 03/20/2020   Anosmia 03/06/2020   Bilateral carotid artery stenosis 03/06/2020   Chronic cough 03/06/2020   Dyspnea on exertion 03/06/2020   GERD (gastroesophageal reflux disease) 03/06/2020   Hyperlipidemia 03/06/2020   Lymphedema 03/06/2020   Migraines 03/06/2020   Osteoarthritis of both knees 03/06/2020   Idiopathic peripheral neuropathy 12/29/2013   Localized, primary osteoarthritis 12/28/2013   Osteoarthritis of knee 12/28/2013   Essential hypertension 10/23/2010   Family history of stroke 10/22/2010    PCP: Enid Baas, MD  REFERRING PROVIDER: Sheppard Plumber, NP  REFERRING DIAG: I89.0  THERAPY DIAG:  Lymphedema, not elsewhere classified  ONSET DATE: >10 yrs  SUBJECTIVE:                                                                                                                                                                                           SUBJECTIVE STATEMENT: Latoya Sloan presents to  Occupational Therapy for BLE lymphedema wearing older, light duty, adjustable, Velcro, wrap style compression stocking alternatives with multilayer compression wraps underneath.  Pt denies LE related pain this morning. Pt has no new complaints.   PERTINENT HISTORY: CHF, chronic , progressive lymphedema BLE, thyroid disease, Bradycardia, B knee OA, Periferal neuropathy, B carotid stenosis  PAIN:  Are you having pain? No: NPRS scale: 0/10 Pain location: B legs Pain description: sore, tender, heavy, tight, achy Aggravating factors: weight bearing Relieving factors: sitting, elevation, compression  PRECAUTIONS: Other: THYROID, B CAROTID ARTERY STENOSIS (NO strokes to lateral neck!) , CHF, BRADYCARDIA  FALLS:  Has patient fallen since last visit? No  HAND DOMINANCE: right   PRIOR LEVEL OF FUNCTION: Independent with basic ADLs, Independent with household mobility with device, Independent with community mobility with device, and Needs assistance with homemaking  PATIENT GOALS: "Because of the swelling, and I want to walk more. Latoya Sloan gotten lazy about my walking.When I go to the vein doctor they wrapped my legs a couple of times, but I don't see them getting better."   OBJECTIVE:  OBSERVATIONS / OTHER ASSESSMENTS:   BLE COMPARATIVE LIMB VOLUMETRICS 10/16/22    LANDMARK  RIGHT  10/16/22   R LEG (A-D) 8223.8 ml  R THIGH (E-G)   R FULL LIMB (A-G)   Limb Volume differential (LVD)    Volume change since initial Increased 17.5% since last measured on 08/26/22%  Volume change overall Increased 8.6% since commencing OT on 07/31/22%  (Blank rows = not tested)   LANDMARK  RIGHT  08/26/22 (9th visit)  R LEG (A-D) 6997.3 ml  R THIGH (E-G)   R FULL LIMB (A-G)   Limb Volume differential (LVD)    Volume change since initial Decreased 7.6%  Volume change overall   (Blank rows = not tested)  LANDMARK  INITIAL RIGHT  07/31/22  R LEG (A-D) 7570.2 ml  R THIGH (E-G)   R FULL LIMB (A-G)   Limb Volume  differential (LVD)    Volume change since initial %  Volume change overall   (Blank rows = not tested)  LANDMARK INITIAL LEFT  07/31/22  R LEG (A-D) 7224.4 ml  R THIGH (E-G) ml  R FULL LIMB (A-G) ml  Limb Volume differential (LVD)  4.6%, L leg  > R leg  Volume change since initial %  Volume change overall %  (Blank rows = not tested)   LYMPHEDEMA LIFE IMPACT SCALE (LLIS): 41.18%  (The extent to which LE-related problems affected your life in the past week.)  FOTO Outcome Measure: 58%   TODAY'S TREATMENT:                                                                                                Pt edu for LE self care  PATIENT EDUCATION:  Lymphedema self care home,e program components Education method: Explanation, Demonstration, Handouts, and needs additional edu Education comprehension: verbalized understanding, returned demonstration, and needs further education  LYMPHEDEMA SELF-CARE HOME PROGRAM 1.Therapeutic lymphatic pumping therex- 2 sets of 10 reps, hold 5 seconds; elements in order. 2 x daily PRN 2.  Full time, daily compression: Intensive Phase CDT: multilayer short stretch bandages from toes to popliteal using gradient techniques  Self-management Phase CDT: Attempt to use existing OTS, light duty adjustable leggings. Consider HOS devices  to limit fibrosis (Jobst Relax ccl 2) 3. Daily skin care to sustain optimal hydration. Wash bites, scratches, blisters, etc with mild soap and water , apply antibacterial first aide cream and a band aide. 4. Simple self-Manual lymphatic drainage (MLD) PRN.: 5 Pt will benefit from daily use of advanced sequential compression device- Flexitouch- during self-management phase of CDT to assist with regular lymphatic decongestion to limit progression as  Pt has difficulty reaching her distal legs and feet to perform simple self-MLD.Pt has used a basic Biotab device daily greater than 1 year with significant physical and sensory symptoms of  lymphedema remaining. 6. Full   time, daytime, Custom, Mediven, CircAid Juxtafit A-D bilaterally over liners calibrated to 30-40 mmHg.  Lymphedema Self- Care Instructions   1. EXERCISE: Perform lymphatic pumping there ex at least 2 x a day while wearing your compression wraps or garments. Perform 10 reps of each exercise bilaterally and be sure to perform them in order. Don't skip around!  OMIT PARTIAL SIT UPs.  2. MLD: Perform simple self-manual lymphatic drainage (MLD) at least once a day as directed. Take your time! Breathe! ;-)  3. If you have a Flexitouch advanced "pump" use it 1 time each day on a single limb only. The Flexitouch moves lymphatic fluid out of your affected body part and back to your heart, so DO NOT use the Flexi on 2 legs at a time, and DO NOT cues it on 2 legs on the same day. If you experience any atypical shortness of breath, sudden onset of pain, or feelings of heart arhythmia, or racing, discontinue use of the Flexitouch and report these symptoms to your doctor right away. Also, discontinue Flexi if you have an infection or a fever. It's OK to resume using the device 72 hours AFTER your first dose of oral antibiotic.   4. 4. WRAPS: Compression wraps are to be worn 23 hrs/ 7 days/wk during Intensive Phase of Complete Decongestive Therapy (CDT).Building tolerance may take time and practice, so don't get discouraged. If bandages begin to feel tight during periods of inactivity and/or during the night, try performing your exercises to loosen them. Do not leave short stretch wraps in place for > 23 hours. It is very important that you remove all wraps daily to inspect skin, bathe and perform skin care before reapplying your wraps.  5. Daytime GARMENTS/ HOS DEVICES: During the Self-Management Phase CDT your compression garments are to be worn during waking hours when active. Do NOT sleep in your garments!!  Don daytime garments first thing in the morning. Do not wear your HOS  devices all day instead of garments. These will not contain your swelling.  6. PUT YOUR FEET UP! Elevate your feet and legs and feet to the level of your heart whenever you are sitting down.   7. SKIN: Carefully monitor skin condition and perform impeccable hygiene daily. Bathe skin with mild soap and water and apply low pH lotion (aka Eucerin ) to improve hydration and limit infection risk.    ASSESSMENT:  CLINICAL IMPRESSION:  Completed fitting of remaining RLE custom, knee length Circaid legging, and initial LLE Circaid legging and both Garment alternatives fit very well and appear to contain swelling as intended. Leg swelling is well controlled, skin is in good condition and Pt denies leg pain bilaterally. Pt is able to don and doff CircAids sing the calibration card to correctly pull tabs to 30-40 mmHg with extra time (modified independent). All goa except FOTO and LLIS met. Will request Pt complete "after" set next visit. will call when remaining LLE Circaid and extra socks arrive. Will schedule 3 month f/u at that time. Pt will call PRN. Pt transitions from Intensive Phase to Self-management phase of CDT this date. Well done. Latoya Sloan!  OBJECTIVE IMPAIRMENTS: decreased knowledge of condition, decreased knowledge of use of DME, decreased mobility, difficulty walking, decreased ROM, increased edema, obesity, and pain.  Unfortunately Pt was not able to retain clinical gains achieved during first 10 or so visits du to inconsistent compression. Systemic fluid retention may also be overloading lymphatics at present. Fitted Pt w compression stocking alternatives that enable her to calibrate compression as well as don and doff with less physical effort. After skilled training and opportunities to practice, Pt is able to don the R CircAid and calibrate the compression   to 30-40 mmHg using the calibration cared provided by the manufacturer. Pt has used Velcro wrap style garment alternatives in the past,  but she has not been able to apply them with consistent, medical grade compression. Plan is for Pt to use R CircAid daily between now and next session   to see if these help to reduce limb volume since they offer short stretch technology. If they do not , then we will resume multilayer wrapping to RLE to get  volume down before resuming CircAid to keep lymphatic swelling down.   ACTIVITY LIMITATIONS: decreased standing and walking tolerance > 30 minutes,  carrying, lifting, bending, extended dependent sitting, squatting, stairs, transfers, lower body bathing, LB grooming, LB dressing, productive, leisure and social participation requiring walking, standing and/ or dependent sitting >30 minutes ( attending social and community activities)  PERSONAL FACTORS: Age, time since onset, and 3+ co morbidities: CHF, OA, HTN are also affecting patient's functional outcome.   REHAB POTENTIAL: Good- with daily CG assistance with compression wraps during Intensive CDT. Poor without assistance with multilayer wraps  EVALUATION COMPLEXITY: moderate   GOALS: Goals reviewed with patient? Yes  SHORT TERM GOALS: Target date: 4th OT  Rx visit   Pt will demonstrate understanding of lymphedema precautions and prevention strategies with modified independence using a printed reference to identify at least 5 precautions and discussing how s/he may implement them into daily life to reduce risk of progression with modified assistance. Baseline:Max A Goal status:08/21/22 GOAL MET  2.   With Max caregiver assistance Pt will be able to apply multilayer, knee length, compression wraps using gradient techniques to decrease limb volume, to limit infection risk, and to limit lymphedema progression.  Baseline:Max A Goal status: 08/07/22 GOAL MET w caregiver. 08/13/22 Goal exceeded with Pt modified independent with wraps to knee.  LONG TERM GOALS: Target date: 10/27/22  Given this patient's Intake score of 58 /100% on the functional  outcomes FOTO tool, patient will experience an increase in function of 3 points to improve basic and instrumental ADLs performance, including lymphedema self-care. (TBA at first OT Rx visit) Baseline: Dependent Goal status: 07/29/22: ONGOING  2.  Given this patient's Intake score of 41.18% on the Lymphedema Life Impact Scale (LLIS), patient will experience a reduction of at least 5% in her perceived level of functional impairment resulting from lymphedema to improve functional performance and quality of life (QOL). Baseline: Dependent Goal status: 07/29/22: ONGOING  3.  With modified assistance (extra time) Pt will be able to don and doff appropriate compression garments and/or devices using correct calibration  for ~ 20-30 mmHg  to control BLE lymphedema and to limit progression.  Baseline: Mod A Goal status: 12/19/22 GOAL MET  4.  Pt will achieve at least a 10% volume reductions bilaterally below the knees to return limb to more typical size and shape, to limit infection risk and LE progression, to decrease pain, to improve function, and to improve body image and QOL. Baseline: Max A Goal status: 08/26/22: RLE: 7.6% volume reduction achieved in RLE measured on 9th visit.  10/16/22 RLE volume increased 17.5% during illness interval. Overall increase in R leg volume = 8.6%  5.  During Intensive phase CDT Pt will achieve at least 85% compliance with all lymphedema self-care home program components, including  daily skin care, multilayer , gradient compression wraps with daily changes, daily simple self MLD and daily lymphatic pumping therex to achieve optimal clinical outcome and to habituate self care regime for optimal LE self-management over time. Baseline: Max A Goal status:12/19/22 GOAL MET   PLAN:  PT FREQUENCY: will call when remaining LLE Circaid and extra socks arrive. Will schedule 3 month f/u at that time. Pt will call PRN.  PT DURATION: 12 weeks  PLANNED INTERVENTIONS: Complete  Decongestive Therapy (CDT) Therapeutic exercises, Therapeutic activity, Patient/Family education, Self Care, DME instructions, Manual lymph drainage, Compression bandaging, and Manual therapy; fit with appropriate compression garments that control swelling and that Pt can don and doff          with extra time and/ or assistive devices; consider trial with advanced sequential pneumatic compression device (Flexitouch) trial which mimics proximal to distal MLD for optimal lymphatic return.  Multilayer compression bandaging below the knee to single leg at a time to limit falls risk. Utilizing one each 8, 10 and 12 cm wide x 5 meters long, short stretch   bandages applied in staggered gradient fashion over single layer of .04 cm thick Rosidal foam applied circumferentially  over cotton stockinett from toes to popliteal fossa.  MLD to RLE/RLQ utilizing modified short neck sequence utilizing strokes to clavicles only. ( NO STROKES TO LATERAL NECK BILATERALLY)  diaphragmatic breathing to activate deep abdominal lymphatics and thoracic duct, functional inguinal LNs, and proximal to distal J strokes to thigh, knee, leg, ankle and foot, then retrograde sweep to finish w/ modified short neck sequence.  PLAN FOR NEXT SESSION:  MLD to RLE w simultaneous skin care Knee length multilayer compression wraps vs CircAid?? Cont Pt edu for LE self  care  Loel Dubonnet, MS, OTR/L, CLT-LANA 11/14/22 11:58 AM

## 2022-12-24 ENCOUNTER — Ambulatory Visit: Payer: Medicare Other | Admitting: Occupational Therapy

## 2023-01-01 ENCOUNTER — Ambulatory Visit: Payer: Medicare Other | Admitting: Occupational Therapy

## 2023-02-18 ENCOUNTER — Encounter (INDEPENDENT_AMBULATORY_CARE_PROVIDER_SITE_OTHER): Payer: Self-pay | Admitting: Nurse Practitioner

## 2023-02-18 ENCOUNTER — Ambulatory Visit (INDEPENDENT_AMBULATORY_CARE_PROVIDER_SITE_OTHER): Payer: Medicare Other | Admitting: Nurse Practitioner

## 2023-02-18 VITALS — BP 148/70 | HR 54 | Resp 18 | Ht 66.0 in | Wt 257.0 lb

## 2023-02-18 DIAGNOSIS — I890 Lymphedema, not elsewhere classified: Secondary | ICD-10-CM

## 2023-02-18 NOTE — Progress Notes (Signed)
History of Present Illness  There is no documented history at this time  Assessments & Plan   There are no diagnoses linked to this encounter.    Additional instructions  Subjective:  Patient presents with venous ulcer of the Bilateral lower extremity.    Procedure:  3 layer unna wrap was placed Bilateral lower extremity.   Plan:   Follow up in one week.  

## 2023-02-25 ENCOUNTER — Ambulatory Visit (INDEPENDENT_AMBULATORY_CARE_PROVIDER_SITE_OTHER): Payer: Medicare Other | Admitting: Nurse Practitioner

## 2023-02-25 ENCOUNTER — Encounter (INDEPENDENT_AMBULATORY_CARE_PROVIDER_SITE_OTHER): Payer: Self-pay

## 2023-02-25 VITALS — BP 202/84 | HR 75 | Resp 16

## 2023-02-25 DIAGNOSIS — I890 Lymphedema, not elsewhere classified: Secondary | ICD-10-CM

## 2023-02-25 NOTE — Progress Notes (Signed)
History of Present Illness  There is no documented history at this time  Assessments & Plan   There are no diagnoses linked to this encounter.    Additional instructions  Subjective:  Patient presents with venous ulcer of the Bilateral lower extremity.    Procedure:  3 layer unna wrap was placed Bilateral lower extremity.   Plan:   Follow up in one week.  

## 2023-03-06 ENCOUNTER — Ambulatory Visit (INDEPENDENT_AMBULATORY_CARE_PROVIDER_SITE_OTHER): Payer: Medicare Other | Admitting: Nurse Practitioner

## 2023-03-06 ENCOUNTER — Encounter (INDEPENDENT_AMBULATORY_CARE_PROVIDER_SITE_OTHER): Payer: Self-pay | Admitting: Nurse Practitioner

## 2023-03-06 VITALS — BP 185/74 | HR 57 | Resp 18 | Ht 66.0 in | Wt 257.0 lb

## 2023-03-06 DIAGNOSIS — I890 Lymphedema, not elsewhere classified: Secondary | ICD-10-CM | POA: Diagnosis not present

## 2023-03-06 NOTE — Progress Notes (Signed)
History of Present Illness  There is no documented history at this time  Assessments & Plan   There are no diagnoses linked to this encounter.    Additional instructions  Subjective:  Patient presents with venous ulcer of the Bilateral lower extremity.    Procedure:  3 layer unna wrap was placed Bilateral lower extremity.   Plan:   Follow up in one week.  

## 2023-03-12 ENCOUNTER — Encounter (INDEPENDENT_AMBULATORY_CARE_PROVIDER_SITE_OTHER): Payer: Self-pay | Admitting: Nurse Practitioner

## 2023-03-12 ENCOUNTER — Ambulatory Visit (INDEPENDENT_AMBULATORY_CARE_PROVIDER_SITE_OTHER): Payer: Medicare Other | Admitting: Nurse Practitioner

## 2023-03-12 VITALS — BP 172/72 | HR 60 | Resp 16 | Wt 250.2 lb

## 2023-03-12 DIAGNOSIS — I10 Essential (primary) hypertension: Secondary | ICD-10-CM

## 2023-03-12 DIAGNOSIS — I890 Lymphedema, not elsewhere classified: Secondary | ICD-10-CM | POA: Diagnosis not present

## 2023-03-12 DIAGNOSIS — E785 Hyperlipidemia, unspecified: Secondary | ICD-10-CM

## 2023-03-16 ENCOUNTER — Encounter (INDEPENDENT_AMBULATORY_CARE_PROVIDER_SITE_OTHER): Payer: Self-pay | Admitting: Nurse Practitioner

## 2023-03-16 NOTE — Progress Notes (Addendum)
Subjective:    Patient ID: Latoya Sloan, female    DOB: 1932/04/12, 89 y.o.   MRN: [id number] Chief Complaint  Patient presents with   Follow-up    Unna wrap check    The patient is an 89 year old female who presents today for evaluation of her lower extremity edema following several weeks of Unna boots.  She has no open wounds or ulcerations and the swelling is under better control.  She has been working to get a lymphedema pump that should hopefully have 1 soon.  She was previously at the lymphedema clinic and this was also helpful for control of her lower extremity edema.    Review of Systems  Cardiovascular:  Positive for leg swelling.  Gastrointestinal:  Positive for abdominal distention.  All other systems reviewed and are negative.      Objective:   Physical Exam Vitals reviewed.  HENT:     Head: Normocephalic.  Cardiovascular:     Rate and Rhythm: Normal rate.  Pulmonary:     Effort: Pulmonary effort is normal.  Abdominal:     General: There is distension.     Comments: Truncal lymphedema noted  Musculoskeletal:     Right lower leg: 2+ Edema present.     Left lower leg: 2+ Edema present.  Skin:    General: Skin is warm and dry.  Neurological:     Mental Status: She is alert and oriented to person, place, and time.  Psychiatric:        Mood and Affect: Mood normal.        Behavior: Behavior normal.        Thought Content: Thought content normal.        Judgment: Judgment normal.     BP (!) 172/72 (BP Location: Right Arm)   Pulse 60   Resp 16   Wt 250 lb 3.2 oz (113.5 kg)   BMI 40.38 kg/m   Past Medical History:  Diagnosis Date   Hypertension    Lymphedema of both lower extremities    Thyroid disease     Social History   Socioeconomic History   Marital status: Widowed    Spouse name: Not on file   Number of children: Not on file   Years of education: Not on file   Highest education level: Not on file  Occupational History   Not on file   Tobacco Use   Smoking status: Never   Smokeless tobacco: Never  Substance and Sexual Activity   Alcohol use: Not Currently   Drug use: Not Currently   Sexual activity: Not on file  Other Topics Concern   Not on file  Social History Narrative   Not on file   Social Determinants of Health   Financial Resource Strain: Low Risk  (10/27/2022)   Received from [facility], [facility]   Overall Financial Resource Strain (CARDIA)    Difficulty of Paying Living Expenses: Not hard at all  Food Insecurity: No Food Insecurity (10/27/2022)   Received from [facility], [facility]   Hunger Vital Sign    Worried About Running Out of Food in the Last Year: Never true    Ran Out of Food in the Last Year: Never true  Transportation Needs: No Transportation Needs (10/27/2022)   Received from [facility], [facility]   [facility] - Transportation    In the past 12 months, has lack of transportation kept  you from medical appointments or from getting medications?: No    Lack of Transportation (Non-Medical): No  Physical Activity: Not on file  Stress: Not on file  Social Connections: Unknown (09/02/2022)   Received from [facility], [facility]   Social Network    Social Network: Not on file  Intimate Partner Violence: Unknown (09/02/2022)   Received from [facility], [facility]   HITS    Physically Hurt: Not on file    Insult or Talk Down To: Not on file    Threaten Physical Harm: Not on file    Scream or Curse: Not on file    History reviewed. No pertinent surgical history.  History reviewed. No pertinent family history.  Allergies  Allergen Reactions   Hydrochlorothiazide     Other Reaction(s): Other  Possible HCTZ-induced pancreatitis   Aspirin     Other Reaction(s): Not available       Latest Ref Rng & Units 04/06/2022    3:41 PM 09/12/2021    8:51 AM 04/07/2021    11:53 AM  CBC  WBC 4.0 - 10.5 K/uL 5.6  8.2  6.1   Hemoglobin 12.0 - 15.0 g/dL 56.4  33.2  95.1   Hematocrit 36.0 - 46.0 % 35.0  36.7  36.8   Platelets 150 - 400 K/uL 216  190  218       CMP     Component Value Date/Time   NA 143 04/06/2022 1541   K 4.2 04/06/2022 1541   CL 108 04/06/2022 1541   CO2 28 04/06/2022 1541   GLUCOSE 93 04/06/2022 1541   BUN 31 (H) 04/06/2022 1541   CREATININE 1.26 (H) 04/06/2022 1541   CALCIUM 9.2 04/06/2022 1541   PROT 7.1 04/06/2022 1541   ALBUMIN 3.9 04/06/2022 1541   AST 18 04/06/2022 1541   ALT 16 04/06/2022 1541   ALKPHOS 78 04/06/2022 1541   BILITOT 1.1 04/06/2022 1541   GFRNONAA 41 (L) 04/06/2022 1541     No results found.     Assessment & Plan:   1. Lymphedema of both lower extremities The patient has been in bilateral Unna boots and this has been helpful with maintaining control of her swelling.  We will continue to have the patient in Unna boots until she can obtain standard compression. Given the shape/size of her legs she will require custom compression socks to achieve adequate fit.   She has also been working to get her lymphedema pump.  I do believe this will also be exceptionally helpful for her.  She is advised to continue with use of elevation of lower extremities in addition to the compression provide of Unna boots.  Patient will return in 4 weeks for evaluation.  Addendum: 04/30/2023:  The patient has had a an [id number] from [employer] received on 04/19/2020 it is a daily use at .  She has noted truncal lymphedema which is not being adequately treated with her current pump.  The limitations of the current EO651 are causing the fluid to pull and pocket in her abdomen which is causing her significant pain and beginning to limit her mobility.  In this instance I feel that the Flexitouch would be best for the patient.  She will benefit from daily use of an advanced sequential compression device with, such as with the Flexitouch,  during the self-management phase of compressive decongestive therapy to assist with regular lymphatic decongestion to limit progression of lymphedema and to improve her current status. 2. Hyperlipidemia, unspecified hyperlipidemia  type Continue statin as ordered and reviewed, no changes at this time  3. Essential hypertension Continue antihypertensive medications as already ordered, these medications have been reviewed and there are no changes at this time.   Current Outpatient Medications on File Prior to Visit  Medication Sig Dispense Refill   acetaminophen (TYLENOL) 325 MG tablet Take by mouth.     aspirin 81 MG chewable tablet Chew 81 mg by mouth daily.     atorvastatin (LIPITOR) 40 MG tablet Take 40 mg by mouth daily.     carvedilol (COREG) 6.25 MG tablet Take by mouth.     Cholecalciferol 50 MCG (2000 UT) CAPS Take 2,000 Units by mouth daily.     furosemide (LASIX) 20 MG tablet Take by mouth.     irbesartan-hydrochlorothiazide (AVALIDE) 150-12.5 MG tablet Take 1 tablet by mouth daily.     ketoconazole (NIZORAL) 2 % cream APPLY CREAM TOPICALLY EVERY DAY AS DIRECTED     levothyroxine (SYNTHROID) 25 MCG tablet Take 25 mcg by mouth daily.     losartan (COZAAR) 100 MG tablet Take 100 mg by mouth daily.     multivitamin-iron-minerals-folic acid (CENTRUM) chewable tablet Chew 1 tablet by mouth daily.     omeprazole (PRILOSEC) 40 MG capsule Take 40 mg by mouth daily.     polyethylene glycol powder (GLYCOLAX/MIRALAX) 17 GM/SCOOP powder 1 cap full in a full glass of water, two times a day for 3 days. 255 g 0   potassium chloride SA (KLOR-CON M) 20 MEQ tablet Take by mouth.     senna-docusate (SENOKOT-S) 8.6-50 MG tablet Take 2 tablets by mouth 2 (two) times daily. 120 tablet 0   benzonatate (TESSALON PERLES) 100 MG capsule Take 1-2 tabs TID prn cough (Patient not taking: Reported on 03/06/2023) 30 capsule 0   bisacodyl (DULCOLAX) 10 MG suppository Place 1 suppository (10 mg total) rectally as  needed for moderate constipation. (Patient not taking: Reported on 03/06/2023) 12 suppository 0   furosemide (LASIX) 20 MG tablet Take by mouth.     No current facility-administered medications on file prior to visit.    There are no Patient Instructions on file for this visit. No follow-ups on file.   Georgiana Spinner, NP

## 2023-03-19 ENCOUNTER — Ambulatory Visit (INDEPENDENT_AMBULATORY_CARE_PROVIDER_SITE_OTHER): Payer: Medicare Other | Admitting: Nurse Practitioner

## 2023-03-19 ENCOUNTER — Encounter (INDEPENDENT_AMBULATORY_CARE_PROVIDER_SITE_OTHER): Payer: Self-pay | Admitting: Nurse Practitioner

## 2023-03-19 VITALS — BP 160/77 | HR 54 | Resp 19

## 2023-03-19 DIAGNOSIS — I890 Lymphedema, not elsewhere classified: Secondary | ICD-10-CM

## 2023-03-20 NOTE — Progress Notes (Signed)
History of Present Illness  There is no documented history at this time  Assessments & Plan   There are no diagnoses linked to this encounter.    Additional instructions  Subjective:  Patient presents with venous ulcer of the Bilateral lower extremity.    Procedure:  3 layer unna wrap was placed Bilateral lower extremity.   Plan:   Follow up in one week.

## 2023-03-26 ENCOUNTER — Encounter (INDEPENDENT_AMBULATORY_CARE_PROVIDER_SITE_OTHER): Payer: Self-pay

## 2023-03-26 ENCOUNTER — Ambulatory Visit (INDEPENDENT_AMBULATORY_CARE_PROVIDER_SITE_OTHER): Payer: Medicare Other | Admitting: Nurse Practitioner

## 2023-03-26 VITALS — BP 152/62 | HR 84 | Resp 16

## 2023-03-26 DIAGNOSIS — I890 Lymphedema, not elsewhere classified: Secondary | ICD-10-CM

## 2023-03-26 NOTE — Progress Notes (Signed)
Patient was schedule bilateral unna wraps but she was taking out of them today. Patient has no open wounds or swelling. She will continue with wearing farrow wraps daily and remove at night. Patient is schedule to return in 6 months with no studies.

## 2023-04-02 ENCOUNTER — Encounter (INDEPENDENT_AMBULATORY_CARE_PROVIDER_SITE_OTHER): Payer: Medicare Other

## 2023-04-09 ENCOUNTER — Encounter (INDEPENDENT_AMBULATORY_CARE_PROVIDER_SITE_OTHER): Payer: Medicare Other | Admitting: Nurse Practitioner

## 2023-04-23 ENCOUNTER — Telehealth (INDEPENDENT_AMBULATORY_CARE_PROVIDER_SITE_OTHER): Payer: Self-pay

## 2023-04-30 ENCOUNTER — Encounter (INDEPENDENT_AMBULATORY_CARE_PROVIDER_SITE_OTHER): Payer: Self-pay

## 2023-04-30 NOTE — Telephone Encounter (Signed)
Note updated

## 2023-05-01 NOTE — Telephone Encounter (Signed)
Error

## 2023-05-06 ENCOUNTER — Encounter (INDEPENDENT_AMBULATORY_CARE_PROVIDER_SITE_OTHER): Payer: Self-pay | Admitting: Nurse Practitioner

## 2023-05-06 ENCOUNTER — Ambulatory Visit (INDEPENDENT_AMBULATORY_CARE_PROVIDER_SITE_OTHER): Payer: Medicare Other

## 2023-05-06 VITALS — BP 153/76 | HR 60 | Resp 16

## 2023-05-06 DIAGNOSIS — I890 Lymphedema, not elsewhere classified: Secondary | ICD-10-CM

## 2023-05-06 NOTE — Progress Notes (Signed)
History of Present Illness  There is no documented history at this time  Assessments & Plan   There are no diagnoses linked to this encounter.    Additional instructions  Subjective:  Patient presents with venous ulcer of the Bilateral lower extremity.    Procedure:  3 layer unna wrap was placed Bilateral lower extremity.   Plan:   Follow up in one week.  

## 2023-05-12 ENCOUNTER — Encounter (INDEPENDENT_AMBULATORY_CARE_PROVIDER_SITE_OTHER): Payer: Self-pay | Admitting: Nurse Practitioner

## 2023-05-12 ENCOUNTER — Ambulatory Visit (INDEPENDENT_AMBULATORY_CARE_PROVIDER_SITE_OTHER): Payer: Medicare Other | Admitting: Nurse Practitioner

## 2023-05-12 VITALS — BP 192/81 | HR 60 | Resp 16 | Wt 262.6 lb

## 2023-05-12 DIAGNOSIS — I890 Lymphedema, not elsewhere classified: Secondary | ICD-10-CM | POA: Diagnosis not present

## 2023-05-12 NOTE — Progress Notes (Unsigned)
History of Present Illness  There is no documented history at this time  Assessments & Plan   There are no diagnoses linked to this encounter.    Additional instructions  Subjective:  Patient presents with venous ulcer of the Bilateral lower extremity.    Procedure:  3 layer unna wrap was placed Bilateral lower extremity.   Plan:   Follow up in one week.  

## 2023-05-13 ENCOUNTER — Encounter (INDEPENDENT_AMBULATORY_CARE_PROVIDER_SITE_OTHER): Payer: Medicare Other

## 2023-05-13 ENCOUNTER — Encounter (INDEPENDENT_AMBULATORY_CARE_PROVIDER_SITE_OTHER): Payer: Self-pay | Admitting: Nurse Practitioner

## 2023-05-20 ENCOUNTER — Ambulatory Visit (INDEPENDENT_AMBULATORY_CARE_PROVIDER_SITE_OTHER): Payer: Medicare Other | Admitting: Nurse Practitioner

## 2023-05-20 ENCOUNTER — Encounter (INDEPENDENT_AMBULATORY_CARE_PROVIDER_SITE_OTHER): Payer: Self-pay

## 2023-05-20 VITALS — BP 185/67 | HR 63 | Resp 18 | Ht 60.0 in | Wt 260.0 lb

## 2023-05-20 DIAGNOSIS — I890 Lymphedema, not elsewhere classified: Secondary | ICD-10-CM

## 2023-05-20 NOTE — Progress Notes (Signed)
History of Present Illness  There is no documented history at this time  Assessments & Plan   There are no diagnoses linked to this encounter.    Additional instructions  Subjective:  Patient presents with venous ulcer of the Bilateral lower extremity.    Procedure:  3 layer unna wrap was placed Bilateral lower extremity.   Plan:   Follow up in one week.  

## 2023-05-27 ENCOUNTER — Ambulatory Visit (INDEPENDENT_AMBULATORY_CARE_PROVIDER_SITE_OTHER): Payer: Medicare Other | Admitting: Nurse Practitioner

## 2023-05-27 ENCOUNTER — Encounter (INDEPENDENT_AMBULATORY_CARE_PROVIDER_SITE_OTHER): Payer: Self-pay

## 2023-05-27 VITALS — BP 166/62 | HR 52 | Resp 16

## 2023-05-27 DIAGNOSIS — I890 Lymphedema, not elsewhere classified: Secondary | ICD-10-CM

## 2023-05-27 NOTE — Progress Notes (Signed)
History of Present Illness  There is no documented history at this time  Assessments & Plan   There are no diagnoses linked to this encounter.    Additional instructions  Subjective:  Patient presents with venous ulcer of the Bilateral lower extremity.    Procedure:  3 layer unna wrap was placed Bilateral lower extremity.   Plan:   Follow up in one week.  

## 2023-05-30 ENCOUNTER — Encounter (INDEPENDENT_AMBULATORY_CARE_PROVIDER_SITE_OTHER): Payer: Self-pay | Admitting: Nurse Practitioner

## 2023-06-09 ENCOUNTER — Ambulatory Visit (INDEPENDENT_AMBULATORY_CARE_PROVIDER_SITE_OTHER): Payer: Medicare Other | Admitting: Nurse Practitioner

## 2023-06-12 ENCOUNTER — Ambulatory Visit (INDEPENDENT_AMBULATORY_CARE_PROVIDER_SITE_OTHER): Payer: Medicare Other | Admitting: Nurse Practitioner

## 2023-06-17 ENCOUNTER — Telehealth (INDEPENDENT_AMBULATORY_CARE_PROVIDER_SITE_OTHER): Payer: Self-pay | Admitting: Nurse Practitioner

## 2023-06-17 ENCOUNTER — Telehealth (INDEPENDENT_AMBULATORY_CARE_PROVIDER_SITE_OTHER): Payer: Self-pay

## 2023-06-17 NOTE — Telephone Encounter (Signed)
Latoya Sloan Physical therapist called, patient has been waiting for a pump for a month states they were told they were waiting on a signature. She is a hight flow Risk. States patients compression stocking are also so tight that it is cutting off her circulation. She needs new stockings. Patient's swelling is to the point where she can barely lift her legs. Please advise regarding getting a pump and new stockings.

## 2023-06-17 NOTE — Telephone Encounter (Signed)
I spoke with [facility] and they will reach out after speaking with the manager to see if anything was needed to proceed further with receiving custom garments. Requesting office note was faxed and received on 06/15/2023 to [facility].

## 2023-06-17 NOTE — Telephone Encounter (Signed)
Scheduled pt for Unna wrap on Friday, 06/19/23 at 9:30am

## 2023-06-17 NOTE — Telephone Encounter (Addendum)
Klara called stating she is needing a notes says Latoya Sloan needs custom wraps because of edema around her ankles. Her measurements doesn't match anything that they have the on shelves.  Please advise

## 2023-06-19 ENCOUNTER — Ambulatory Visit (INDEPENDENT_AMBULATORY_CARE_PROVIDER_SITE_OTHER): Payer: Medicare Other | Admitting: Nurse Practitioner

## 2023-06-19 VITALS — BP 148/79 | HR 68 | Resp 16

## 2023-06-19 DIAGNOSIS — I890 Lymphedema, not elsewhere classified: Secondary | ICD-10-CM | POA: Diagnosis not present

## 2023-06-19 NOTE — Progress Notes (Unsigned)
History of Present Illness  There is no documented history at this time  Assessments & Plan   There are no diagnoses linked to this encounter.    Additional instructions  Subjective:  Patient presents with venous ulcer of the Bilateral lower extremity.    Procedure:  3 layer unna wrap was placed Bilateral lower extremity.   Plan:   Follow up in one week.  

## 2023-06-22 ENCOUNTER — Encounter (INDEPENDENT_AMBULATORY_CARE_PROVIDER_SITE_OTHER): Payer: Self-pay | Admitting: Nurse Practitioner

## 2023-06-26 ENCOUNTER — Ambulatory Visit (INDEPENDENT_AMBULATORY_CARE_PROVIDER_SITE_OTHER): Payer: Medicare Other | Admitting: Nurse Practitioner

## 2023-06-26 VITALS — BP 151/72 | HR 65 | Resp 16

## 2023-06-26 DIAGNOSIS — I890 Lymphedema, not elsewhere classified: Secondary | ICD-10-CM | POA: Diagnosis not present

## 2023-06-28 ENCOUNTER — Encounter (INDEPENDENT_AMBULATORY_CARE_PROVIDER_SITE_OTHER): Payer: Self-pay | Admitting: Nurse Practitioner

## 2023-06-28 NOTE — Progress Notes (Signed)
History of Present Illness  There is no documented history at this time  Assessments & Plan   There are no diagnoses linked to this encounter.    Additional instructions  Subjective:  Patient presents with venous ulcer of the Bilateral lower extremity.    Procedure:  3 layer unna wrap was placed Bilateral lower extremity.   Plan:   Follow up in one week.  

## 2023-07-03 ENCOUNTER — Ambulatory Visit (INDEPENDENT_AMBULATORY_CARE_PROVIDER_SITE_OTHER): Payer: Medicare Other | Admitting: Nurse Practitioner

## 2023-07-03 DIAGNOSIS — I890 Lymphedema, not elsewhere classified: Secondary | ICD-10-CM | POA: Diagnosis not present

## 2023-07-03 NOTE — Progress Notes (Unsigned)
History of Present Illness  There is no documented history at this time  Assessments & Plan   There are no diagnoses linked to this encounter.    Additional instructions  Subjective:  Patient presents with venous ulcer of the Bilateral lower extremity.    Procedure:  3 layer unna wrap was placed Bilateral lower extremity.   Plan:   Follow up in one week.  

## 2023-07-04 ENCOUNTER — Encounter (INDEPENDENT_AMBULATORY_CARE_PROVIDER_SITE_OTHER): Payer: Self-pay | Admitting: Nurse Practitioner

## 2023-07-10 ENCOUNTER — Ambulatory Visit (INDEPENDENT_AMBULATORY_CARE_PROVIDER_SITE_OTHER): Payer: Medicare Other | Admitting: Nurse Practitioner

## 2023-07-10 ENCOUNTER — Encounter (INDEPENDENT_AMBULATORY_CARE_PROVIDER_SITE_OTHER): Payer: Self-pay | Admitting: Nurse Practitioner

## 2023-07-10 VITALS — BP 156/75 | HR 72 | Resp 16

## 2023-07-10 DIAGNOSIS — I890 Lymphedema, not elsewhere classified: Secondary | ICD-10-CM

## 2023-07-10 NOTE — Progress Notes (Signed)
 History of Present Illness  There is no documented history at this time  Assessments & Plan   There are no diagnoses linked to this encounter.    Additional instructions  Subjective:  Patient presents with venous ulcer of the Bilateral lower extremity.    Procedure:  3 layer unna wrap was placed Bilateral lower extremity.   Plan:   Follow up in one week.

## 2023-07-11 ENCOUNTER — Encounter (INDEPENDENT_AMBULATORY_CARE_PROVIDER_SITE_OTHER): Payer: Self-pay | Admitting: Nurse Practitioner

## 2023-07-15 ENCOUNTER — Ambulatory Visit (INDEPENDENT_AMBULATORY_CARE_PROVIDER_SITE_OTHER): Payer: Medicare Other | Admitting: Nurse Practitioner

## 2023-07-15 ENCOUNTER — Encounter (INDEPENDENT_AMBULATORY_CARE_PROVIDER_SITE_OTHER): Payer: Self-pay | Admitting: Nurse Practitioner

## 2023-07-15 VITALS — BP 174/75 | HR 62 | Resp 18 | Ht 67.0 in | Wt 259.5 lb

## 2023-07-15 DIAGNOSIS — E785 Hyperlipidemia, unspecified: Secondary | ICD-10-CM | POA: Diagnosis not present

## 2023-07-15 DIAGNOSIS — I890 Lymphedema, not elsewhere classified: Secondary | ICD-10-CM | POA: Diagnosis not present

## 2023-07-15 DIAGNOSIS — I10 Essential (primary) hypertension: Secondary | ICD-10-CM | POA: Diagnosis not present

## 2023-07-15 NOTE — Progress Notes (Signed)
 Subjective:    Patient ID: Kent Bull, female    DOB: Apr 18, 1932, 89 y.o.   MRN: [id number] Chief Complaint  Patient presents with   Follow-up    6 months no studies    The patient returns to the office for followup evaluation regarding leg swelling.  The swelling has persisted and the pain associated with swelling continues. There have not been any interval development of a ulcerations or wounds.  Since the previous visit the patient has been wearing graduated compression stockings and has noted little if any improvement in the lymphedema. The patient has been using compression routinely morning until night.  She currently has a basic lymphedema pump that she has been utilizing twice daily with no improvement of symptoms.  The patient also states elevation during the day and exercise is being done too.      Review of Systems  Cardiovascular:  Positive for leg swelling.  Musculoskeletal:  Positive for arthralgias.  Neurological:  Positive for weakness.  All other systems reviewed and are negative.      Objective:   Physical Exam Vitals reviewed.  HENT:     Head: Normocephalic.  Cardiovascular:     Rate and Rhythm: Normal rate.     Pulses: Normal pulses.  Pulmonary:     Effort: Pulmonary effort is normal.  Musculoskeletal:     Right lower leg: Edema present.     Left lower leg: Edema present.  Skin:    General: Skin is warm and dry.  Neurological:     Mental Status: She is alert and oriented to person, place, and time.     Gait: Gait abnormal.  Psychiatric:        Mood and Affect: Mood normal.        Behavior: Behavior normal.        Thought Content: Thought content normal.        Judgment: Judgment normal.     BP (!) 174/75   Pulse 62   Resp 18   Ht 5' 7 (1.702 m)   Wt 259 lb 8 oz (117.7 kg)   BMI 40.64 kg/m   Past Medical History:  Diagnosis Date   Hypertension    Lymphedema of both lower extremities    Thyroid  disease     Social History    Socioeconomic History   Marital status: Widowed    Spouse name: Not on file   Number of children: Not on file   Years of education: Not on file   Highest education level: Not on file  Occupational History   Not on file  Tobacco Use   Smoking status: Never   Smokeless tobacco: Never  Substance and Sexual Activity   Alcohol use: Not Currently   Drug use: Not Currently   Sexual activity: Not on file  Other Topics Concern   Not on file  Social History Narrative   Not on file   Social Drivers of Health   Financial Resource Strain: Low Risk  (03/31/2023)   Received from [facility]   Overall Financial Resource Strain (CARDIA)    Difficulty of Paying Living Expenses: Not hard at all  Food Insecurity: No Food Insecurity (03/31/2023)   Received from [facility]   Hunger Vital Sign    Worried About Running Out of Food in the Last Year: Never true    Ran Out of Food in the Last Year: Never true  Transportation Needs: No Transportation Needs (03/31/2023)  Received from [facility]    In the past 12 months, has lack of transportation kept you from medical appointments or from getting medications?: No    Lack of Transportation (Non-Medical): No  Physical Activity: Not on file  Stress: Not on file  Social Connections: Unknown (09/02/2022)   Received from [facility]   Social Network    Social Network: Not on file  Intimate Partner Violence: Unknown (09/02/2022)   Received from [facility], [facility]   HITS    Physically Hurt: Not on file    Insult or Talk Down To: Not on file    Threaten Physical Harm: Not on file    Scream or Curse: Not on file    History reviewed. No pertinent surgical history.  History reviewed. No pertinent family history.  Allergies  Allergen Reactions   Hydrochlorothiazide     Other Reaction(s): Other  Possible HCTZ-induced pancreatitis   Aspirin      Other Reaction(s): Not available       Latest Ref Rng & Units 04/06/2022    3:41 PM 09/12/2021    8:51 AM 04/07/2021   11:53 AM  CBC  WBC 4.0 - 10.5 K/uL 5.6  8.2  6.1   Hemoglobin 12.0 - 15.0 g/dL 88.9  88.5  88.0   Hematocrit 36.0 - 46.0 % 35.0  36.7  36.8   Platelets 150 - 400 K/uL 216  190  218       CMP     Component Value Date/Time   NA 143 04/06/2022 1541   K 4.2 04/06/2022 1541   CL 108 04/06/2022 1541   CO2 28 04/06/2022 1541   GLUCOSE 93 04/06/2022 1541   BUN 31 (H) 04/06/2022 1541   CREATININE 1.26 (H) 04/06/2022 1541   CALCIUM 9.2 04/06/2022 1541   PROT 7.1 04/06/2022 1541   ALBUMIN 3.9 04/06/2022 1541   AST 18 04/06/2022 1541   ALT 16 04/06/2022 1541   ALKPHOS 78 04/06/2022 1541   BILITOT 1.1 04/06/2022 1541   GFRNONAA 41 (L) 04/06/2022 1541     No results found.     Assessment & Plan:   1. Lymphedema of both lower extremities (Primary) Currently the patient has the [employer] basic pump [id number] and she has been utilizing this daily, 1 hour in the morning and 1 hour in the afternoon, for the last 4 weeks.  However this has not been helpful in the control of her lymphedema.  She still continues to have significant pain, swelling, hyperpigmentation and fibrosis.  In addition to the use of the lymphedema pumps she has also done 4 weeks of compression, 15 to 20 minutes of exercise and frequent elevation.  In addition she has also completed manual lymphatic drainage during these 4 weeks as well.  She began diligent and consistent use of these conservative therapies since her visit on 03/12/2023.  And despite this her her symptoms are persistent and unrelenting.  In addition, during this time the patient has continued to follow dietary recommendations including reduced sodium diet and increase water intake.  Currently her pretreatment measurements with a basic pump of her right ankle are 37 cm and her left ankle is 36 cm with pretreatment.  Her pretreatment right calf  measurement is 51 cm and her pretreatment left calf is 47 cm.  Her premeasurement of her hip was 132 cm.  Today we used the [employer] pump and and utilized it for 10 minutes  at 30 mmHg.  She had essentially no change in post measurements distally.  Her proximal measurements increased.  Her proximal measurement of her hip increased to 134 cm.  The right ankle posttreatment continued to be 37 cm and her left ankle continue to be 36 cm.  The left calf continued to be 47 cm and her right calf continued to be 51 cm.  Based on the trial time frames as well as the previous conservative therapy that she has been doing with her existing pump at home, I feel that she would greatly benefit from the use of a Flexitouch conversion.  This would provide better treatment for proximal clearing in the her trunk.  In addition distally it will help with her symptoms of hyperpigmentation and fibrosis as well as to reduce the possibility of open wounds, ulcerations and cellulitis.  We utilized a trial measurement of the Flexitouch today and that demonstrated reduction in her ankle premeasurements, and additionally the pain that she previously felt improved.  She also noted that the Flexitouch device was more comfortable and less painful and use than her current [place] pump.    2. Essential hypertension Continue antihypertensive medications as already ordered, these medications have been reviewed and there are no changes at this time.  3. Hyperlipidemia, unspecified hyperlipidemia type Continue statin as ordered and reviewed, no changes at this time   Current Outpatient Medications on File Prior to Visit  Medication Sig Dispense Refill   acetaminophen (TYLENOL) 325 MG tablet Take by mouth.     aspirin 81 MG chewable tablet Chew 81 mg by mouth daily.     atorvastatin (LIPITOR) 40 MG tablet Take 40 mg by mouth daily.     benzonatate  (TESSALON  PERLES) 100 MG capsule Take 1-2 tabs TID prn cough 30 capsule 0   carvedilol   (COREG ) 6.25 MG tablet Take by mouth.     Cholecalciferol 50 MCG (2000 UT) CAPS Take 2,000 Units by mouth daily.     furosemide  (LASIX ) 20 MG tablet Take by mouth.     furosemide  (LASIX ) 20 MG tablet Take by mouth.     irbesartan-hydrochlorothiazide (AVALIDE) 150-12.5 MG tablet Take 1 tablet by mouth daily.     ketoconazole (NIZORAL) 2 % cream APPLY CREAM TOPICALLY EVERY DAY AS DIRECTED     levothyroxine (SYNTHROID) 25 MCG tablet Take 25 mcg by mouth daily.     losartan (COZAAR) 100 MG tablet Take 100 mg by mouth daily.     meloxicam (MOBIC) 7.5 MG tablet Take 7.5 mg by mouth daily.     multivitamin-iron-minerals-folic acid (CENTRUM) chewable tablet Chew 1 tablet by mouth daily.     omeprazole (PRILOSEC) 40 MG capsule Take 40 mg by mouth daily.     polyethylene glycol powder (GLYCOLAX /MIRALAX ) 17 GM/SCOOP powder 1 cap full in a full glass of water, two times a day for 3 days. 255 g 0   potassium chloride  SA (KLOR-CON  M) 20 MEQ tablet Take by mouth.     senna-docusate (SENOKOT-S) 8.6-50 MG tablet Take 2 tablets by mouth 2 (two) times daily. 120 tablet 0   bisacodyl  (DULCOLAX) 10 MG suppository Place 1 suppository (10 mg total) rectally as needed for moderate constipation. (Patient not taking: Reported on 07/15/2023) 12 suppository 0   No current facility-administered medications on file prior to visit.    There are no Patient Instructions on file for this visit. No follow-ups on file.   Ramonda Galyon E Damarea Merkel, NP

## 2023-07-22 ENCOUNTER — Encounter (INDEPENDENT_AMBULATORY_CARE_PROVIDER_SITE_OTHER): Payer: Medicare Other

## 2023-07-24 ENCOUNTER — Ambulatory Visit (INDEPENDENT_AMBULATORY_CARE_PROVIDER_SITE_OTHER): Payer: Medicare Other | Admitting: Nurse Practitioner

## 2023-07-24 ENCOUNTER — Encounter (INDEPENDENT_AMBULATORY_CARE_PROVIDER_SITE_OTHER): Payer: Self-pay | Admitting: Nurse Practitioner

## 2023-07-24 VITALS — BP 156/67 | HR 54 | Resp 16

## 2023-07-24 DIAGNOSIS — I890 Lymphedema, not elsewhere classified: Secondary | ICD-10-CM

## 2023-07-24 NOTE — Progress Notes (Unsigned)
History of Present Illness  There is no documented history at this time  Assessments & Plan   There are no diagnoses linked to this encounter.    Additional instructions  Subjective:  Patient presents with venous ulcer of the Bilateral lower extremity.    Procedure:  3 layer unna wrap was placed Bilateral lower extremity.   Plan:   Follow up in one week.  

## 2023-07-25 ENCOUNTER — Encounter (INDEPENDENT_AMBULATORY_CARE_PROVIDER_SITE_OTHER): Payer: Self-pay | Admitting: Nurse Practitioner

## 2023-07-29 ENCOUNTER — Ambulatory Visit (INDEPENDENT_AMBULATORY_CARE_PROVIDER_SITE_OTHER): Payer: Medicare Other | Admitting: Nurse Practitioner

## 2023-07-29 ENCOUNTER — Encounter (INDEPENDENT_AMBULATORY_CARE_PROVIDER_SITE_OTHER): Payer: Medicare Other

## 2023-07-31 ENCOUNTER — Ambulatory Visit (INDEPENDENT_AMBULATORY_CARE_PROVIDER_SITE_OTHER): Payer: Medicare Other | Admitting: Nurse Practitioner

## 2023-07-31 ENCOUNTER — Encounter (INDEPENDENT_AMBULATORY_CARE_PROVIDER_SITE_OTHER): Payer: Self-pay | Admitting: Nurse Practitioner

## 2023-07-31 VITALS — BP 199/72 | HR 75 | Resp 16

## 2023-07-31 DIAGNOSIS — I890 Lymphedema, not elsewhere classified: Secondary | ICD-10-CM | POA: Diagnosis not present

## 2023-07-31 NOTE — Progress Notes (Unsigned)
History of Present Illness  There is no documented history at this time  Assessments & Plan   There are no diagnoses linked to this encounter.    Additional instructions  Subjective:  Patient presents with venous ulcer of the Bilateral lower extremity.    Procedure:  3 layer unna wrap was placed Bilateral lower extremity.   Plan:   Follow up in one week.

## 2023-08-03 ENCOUNTER — Encounter (INDEPENDENT_AMBULATORY_CARE_PROVIDER_SITE_OTHER): Payer: Self-pay | Admitting: Nurse Practitioner

## 2023-08-05 ENCOUNTER — Encounter (INDEPENDENT_AMBULATORY_CARE_PROVIDER_SITE_OTHER): Payer: Medicare Other

## 2023-08-06 ENCOUNTER — Telehealth (INDEPENDENT_AMBULATORY_CARE_PROVIDER_SITE_OTHER): Payer: Self-pay

## 2023-08-06 NOTE — Telephone Encounter (Signed)
Ms. Latoya Sloan called stating that she is currently sick and unable to come her appointment tomorrow. She wanted to know do she still need to come even if she has her pumps.   Please advise   Rebekah- Please cancel her appt.

## 2023-08-06 NOTE — Telephone Encounter (Signed)
We can cancel her appointment and just haver her follow up with me on her 02/14 appointment

## 2023-08-07 ENCOUNTER — Encounter (INDEPENDENT_AMBULATORY_CARE_PROVIDER_SITE_OTHER): Payer: Medicare Other

## 2023-08-07 NOTE — Telephone Encounter (Signed)
 That is fine

## 2023-08-07 NOTE — Telephone Encounter (Signed)
 Message given

## 2023-08-07 NOTE — Telephone Encounter (Signed)
She wants to know do she have to come next week to get wrapped if she has her machine. She said she will come on the 08/21/23.

## 2023-08-12 ENCOUNTER — Encounter (INDEPENDENT_AMBULATORY_CARE_PROVIDER_SITE_OTHER): Payer: Medicare Other

## 2023-08-14 ENCOUNTER — Encounter (INDEPENDENT_AMBULATORY_CARE_PROVIDER_SITE_OTHER): Payer: Medicare Other

## 2023-08-19 ENCOUNTER — Ambulatory Visit (INDEPENDENT_AMBULATORY_CARE_PROVIDER_SITE_OTHER): Payer: Medicare Other | Admitting: Nurse Practitioner

## 2023-08-21 ENCOUNTER — Encounter (INDEPENDENT_AMBULATORY_CARE_PROVIDER_SITE_OTHER): Payer: Self-pay | Admitting: Nurse Practitioner

## 2023-08-21 ENCOUNTER — Ambulatory Visit (INDEPENDENT_AMBULATORY_CARE_PROVIDER_SITE_OTHER): Payer: Medicare Other | Admitting: Nurse Practitioner

## 2023-08-21 VITALS — BP 195/67 | HR 56 | Resp 18 | Ht 67.0 in | Wt 259.0 lb

## 2023-08-21 DIAGNOSIS — I890 Lymphedema, not elsewhere classified: Secondary | ICD-10-CM

## 2023-08-21 DIAGNOSIS — E785 Hyperlipidemia, unspecified: Secondary | ICD-10-CM | POA: Diagnosis not present

## 2023-08-21 DIAGNOSIS — I10 Essential (primary) hypertension: Secondary | ICD-10-CM | POA: Diagnosis not present

## 2023-08-23 NOTE — Progress Notes (Signed)
 Subjective:    Patient ID: Latoya Sloan, female    DOB: [birth year], 89 y.o.   MRN: [id number] Chief Complaint  Patient presents with   Follow-up    Unna follow up    The patient returns to the office for followup evaluation regarding leg swelling.  The swelling has persisted but with the lymph pump is under much, much better controlled. The pain associated with swelling is decreased. There have not been any interval development of a ulcerations or wounds.  The patient denies problems with the pump, noting it is working well and the leggings are in good condition.  Since the previous visit the patient has been wearing graduated compression stockings and using the lymph pump on a routine basis and  has noted significant improvement in the lymphedema.   Patient stated the lymph pump has been helpful with the treatment of the lymphedema.      Review of Systems  Cardiovascular:  Positive for leg swelling.  Musculoskeletal:  Positive for arthralgias.  All other systems reviewed and are negative.      Objective:   Physical Exam Vitals reviewed.  HENT:     Head: Normocephalic.  Cardiovascular:     Rate and Rhythm: Normal rate.  Pulmonary:     Effort: Pulmonary effort is normal.  Musculoskeletal:     Right lower leg: Edema present.     Left lower leg: Edema present.  Skin:    General: Skin is warm and dry.  Neurological:     Mental Status: She is alert and oriented to person, place, and time.  Psychiatric:        Mood and Affect: Mood normal.        Behavior: Behavior normal.        Thought Content: Thought content normal.        Judgment: Judgment normal.     BP (!) 195/67   Pulse (!) 56   Resp 18   Ht 5\' 7"  (1.702 m)   Wt 259 lb (117.5 kg)   BMI 40.57 kg/m   Past Medical History:  Diagnosis Date   Hypertension    Lymphedema of both lower extremities    Thyroid disease     Social History   Socioeconomic History   Marital status: Widowed    Spouse name:  Not on file   Number of children: Not on file   Years of education: Not on file   Highest education level: Not on file  Occupational History   Not on file  Tobacco Use   Smoking status: Never   Smokeless tobacco: Never  Substance and Sexual Activity   Alcohol use: Not Currently   Drug use: Not Currently   Sexual activity: Not on file  Other Topics Concern   Not on file  Social History Narrative   Not on file   Social Drivers of Health   Financial Resource Strain: Low Risk  (03/31/2023)   Received [facility]   Overall Financial Resource Strain (CARDIA)    Difficulty of Paying Living Expenses: Not hard at all  Food Insecurity: No Food Insecurity (03/31/2023)   Received from [facility]   Hunger Vital Sign    Worried About Running Out of Food in the Last Year: Never true    Ran Out of Food in the Last Year: Never true  Transportation Needs: No Transportation Needs (03/31/2023)   Received from [facility]   [facility]  In the past 12 months, has lack of transportation kept you from medical appointments or from getting medications?: No    Lack of Transportation (Non-Medical): No  Physical Activity: Not on file  Stress: Not on file  Social Connections: Unknown (09/02/2022)   Received from [facility]   Social Network    Social Network: Not on file  Intimate Partner Violence: Unknown (09/02/2022)   Received from [facility]   HITS    Physically Hurt: Not on file    Insult or Talk Down To: Not on file    Threaten Physical Harm: Not on file    Scream or Curse: Not on file    History reviewed. No pertinent surgical history.  History reviewed. No pertinent family history.  Allergies  Allergen Reactions   Hydrochlorothiazide     Other Reaction(s): Other  Possible HCTZ-induced pancreatitis   Aspirin     Other Reaction(s): Not available       Latest Ref Rng &  Units 04/06/2022    3:41 PM 09/12/2021    8:51 AM 04/07/2021   11:53 AM  CBC  WBC 4.0 - 10.5 K/uL 5.6  8.2  6.1   Hemoglobin 12.0 - 15.0 g/dL 45.4  09.8  11.9   Hematocrit 36.0 - 46.0 % 35.0  36.7  36.8   Platelets 150 - 400 K/uL 216  190  218       CMP     Component Value Date/Time   NA 143 04/06/2022 1541   K 4.2 04/06/2022 1541   CL 108 04/06/2022 1541   CO2 28 04/06/2022 1541   GLUCOSE 93 04/06/2022 1541   BUN 31 (H) 04/06/2022 1541   CREATININE 1.26 (H) 04/06/2022 1541   CALCIUM 9.2 04/06/2022 1541   PROT 7.1 04/06/2022 1541   ALBUMIN 3.9 04/06/2022 1541   AST 18 04/06/2022 1541   ALT 16 04/06/2022 1541   ALKPHOS 78 04/06/2022 1541   BILITOT 1.1 04/06/2022 1541   GFRNONAA 41 (L) 04/06/2022 1541     No results found.     Assessment & Plan:   1. Lymphedema of both lower extremities (Primary) Recommend:  No surgery or intervention at this point in time.    I have reviewed my discussion with the patient regarding lymphedema and why it  causes symptoms.  Patient will continue wearing graduated compression on a daily basis. The patient should put the compression on first thing in the morning and removing them in the evening. The patient should not sleep in the compression.   In addition, behavioral modification throughout the day will be continued.  This will include frequent elevation (such as in a recliner), use of over the counter pain medications as needed and exercise such as walking.  The systemic causes for chronic edema such as liver, kidney and cardiac etiologies does not appear to have significant changed over the past year.    The patient will continue aggressive use of the  lymph pump.  This will continue to improve the edema control and prevent sequela such as ulcers and infections.    2. Essential hypertension Continue antihypertensive medications as already ordered, these medications have been reviewed and there are no changes at this time.  3.  Hyperlipidemia, unspecified hyperlipidemia type Continue statin as ordered and reviewed, no changes at this time    Current Outpatient Medications on File Prior to Visit  Medication Sig Dispense Refill   acetaminophen (TYLENOL) 325 MG tablet Take by mouth.  aspirin 81 MG chewable tablet Chew 81 mg by mouth daily.     atorvastatin (LIPITOR) 40 MG tablet Take 40 mg by mouth daily.     benzonatate (TESSALON PERLES) 100 MG capsule Take 1-2 tabs TID prn cough 30 capsule 0   carvedilol (COREG) 6.25 MG tablet Take by mouth.     Cholecalciferol 50 MCG (2000 UT) CAPS Take 2,000 Units by mouth daily.     furosemide (LASIX) 20 MG tablet Take by mouth.     furosemide (LASIX) 20 MG tablet Take by mouth.     irbesartan-hydrochlorothiazide (AVALIDE) 150-12.5 MG tablet Take 1 tablet by mouth daily.     ketoconazole (NIZORAL) 2 % cream APPLY CREAM TOPICALLY EVERY DAY AS DIRECTED     levothyroxine (SYNTHROID) 25 MCG tablet Take 25 mcg by mouth daily.     losartan (COZAAR) 100 MG tablet Take 100 mg by mouth daily.     meloxicam (MOBIC) 7.5 MG tablet Take 7.5 mg by mouth daily.     multivitamin-iron-minerals-folic acid (CENTRUM) chewable tablet Chew 1 tablet by mouth daily.     omeprazole (PRILOSEC) 40 MG capsule Take 40 mg by mouth daily.     polyethylene glycol powder (GLYCOLAX/MIRALAX) 17 GM/SCOOP powder 1 cap full in a full glass of water, two times a day for 3 days. 255 g 0   potassium chloride SA (KLOR-CON M) 20 MEQ tablet Take by mouth.     senna-docusate (SENOKOT-S) 8.6-50 MG tablet Take 2 tablets by mouth 2 (two) times daily. 120 tablet 0   bisacodyl (DULCOLAX) 10 MG suppository Place 1 suppository (10 mg total) rectally as needed for moderate constipation. (Patient not taking: Reported on 03/19/2023) 12 suppository 0   No current facility-administered medications on file prior to visit.    There are no Patient Instructions on file for this visit. No follow-ups on file.   Georgiana Spinner,  NP

## 2023-09-22 ENCOUNTER — Ambulatory Visit (INDEPENDENT_AMBULATORY_CARE_PROVIDER_SITE_OTHER): Payer: Medicare Other | Admitting: Nurse Practitioner

## 2023-09-22 ENCOUNTER — Encounter (INDEPENDENT_AMBULATORY_CARE_PROVIDER_SITE_OTHER): Payer: Self-pay | Admitting: Nurse Practitioner

## 2023-09-22 VITALS — BP 162/63 | HR 60 | Resp 18 | Wt 251.0 lb

## 2023-09-22 DIAGNOSIS — I890 Lymphedema, not elsewhere classified: Secondary | ICD-10-CM

## 2023-09-22 DIAGNOSIS — I10 Essential (primary) hypertension: Secondary | ICD-10-CM

## 2023-09-22 DIAGNOSIS — E785 Hyperlipidemia, unspecified: Secondary | ICD-10-CM

## 2023-09-22 NOTE — Progress Notes (Signed)
 Subjective:    Patient ID: Latoya Sloan, female    DOB: Nov 22, 1931, 89 y.o.   MRN: [id number] Chief Complaint  Patient presents with   Follow-up    2 month follow up    The patient returns to the office for followup evaluation regarding leg swelling.  The swelling has persisted but with the lymph pump is under much, much better controlled. The pain associated with swelling is decreased. She has some occasional tenderness but no eveidence of cellulitis. There have not been any interval development of a ulcerations or wounds.  The patient denies problems with the pump, noting it is working well and the leggings are in good condition.  Since the previous visit the patient has been wearing graduated compression stockings and using the lymph pump on a routine basis and  has noted significant improvement in the lymphedema.   Patient stated the lymph pump has been helpful with the treatment of the lymphedema.      Review of Systems  Cardiovascular:  Positive for leg swelling.  Musculoskeletal:  Positive for arthralgias.  All other systems reviewed and are negative.      Objective:   Physical Exam Vitals reviewed.  HENT:     Head: Normocephalic.  Cardiovascular:     Rate and Rhythm: Normal rate.  Pulmonary:     Effort: Pulmonary effort is normal.  Musculoskeletal:     Right lower leg: Edema present.     Left lower leg: Edema present.  Skin:    General: Skin is warm and dry.  Neurological:     Mental Status: She is alert and oriented to person, place, and time.  Psychiatric:        Mood and Affect: Mood normal.        Behavior: Behavior normal.        Thought Content: Thought content normal.        Judgment: Judgment normal.     BP (!) 162/63   Pulse 60   Resp 18   Wt 251 lb (113.9 kg)   BMI 39.31 kg/m   Past Medical History:  Diagnosis Date   Hypertension    Lymphedema of both lower extremities    Thyroid disease     Social History   Socioeconomic History    Marital status: Widowed    Spouse name: Not on file   Number of children: Not on file   Years of education: Not on file   Highest education level: Not on file  Occupational History   Not on file  Tobacco Use   Smoking status: Never   Smokeless tobacco: Never  Substance and Sexual Activity   Alcohol use: Not Currently   Drug use: Not Currently   Sexual activity: Not on file  Other Topics Concern   Not on file  Social History Narrative   Not on file   Social Drivers of Health   Financial Resource Strain: Low Risk  (03/31/2023)   Received from [facility]   Overall Financial Resource Strain (CARDIA)    Difficulty of Paying Living Expenses: Not hard at all  Food Insecurity: No Food Insecurity (03/31/2023)   Received from [facility]   Hunger Vital Sign    Worried About Running Out of Food in the Last Year: Never true    Ran Out of Food in the Last Year: Never true  Transportation Needs: No Transportation Needs (03/31/2023)   Received from [facility]   PRAPARE -  Transportation    In the past 12 months, has lack of transportation kept you from medical appointments or from getting medications?: No    Lack of Transportation (Non-Medical): No  Physical Activity: Not on file  Stress: Not on file  Social Connections: Unknown (09/02/2022)   Received from [facility], [facility]   Social Network    Social Network: Not on file  Intimate Partner Violence: Unknown (09/02/2022)   Received from [facility], [facility]   HITS    Physically Hurt: Not on file    Insult or Talk Down To: Not on file    Threaten Physical Harm: Not on file    Scream or Curse: Not on file    History reviewed. No pertinent surgical history.  History reviewed. No pertinent family history.  Allergies  Allergen Reactions   Hydrochlorothiazide     Other Reaction(s): Other  Possible HCTZ-induced pancreatitis   Aspirin     Other Reaction(s):  Not available       Latest Ref Rng & Units 04/06/2022    3:41 PM 09/12/2021    8:51 AM 04/07/2021   11:53 AM  CBC  WBC 4.0 - 10.5 K/uL 5.6  8.2  6.1   Hemoglobin 12.0 - 15.0 g/dL 30.8  65.7  84.6   Hematocrit 36.0 - 46.0 % 35.0  36.7  36.8   Platelets 150 - 400 K/uL 216  190  218       CMP     Component Value Date/Time   NA 143 04/06/2022 1541   K 4.2 04/06/2022 1541   CL 108 04/06/2022 1541   CO2 28 04/06/2022 1541   GLUCOSE 93 04/06/2022 1541   BUN 31 (H) 04/06/2022 1541   CREATININE 1.26 (H) 04/06/2022 1541   CALCIUM 9.2 04/06/2022 1541   PROT 7.1 04/06/2022 1541   ALBUMIN 3.9 04/06/2022 1541   AST 18 04/06/2022 1541   ALT 16 04/06/2022 1541   ALKPHOS 78 04/06/2022 1541   BILITOT 1.1 04/06/2022 1541   GFRNONAA 41 (L) 04/06/2022 1541     No results found.     Assessment & Plan:   1. Lymphedema of both lower extremities (Primary) Recommend:  No surgery or intervention at this point in time.    I have reviewed my discussion with the patient regarding lymphedema and why it  causes symptoms.  Patient will continue wearing graduated compression on a daily basis. The patient should put the compression on first thing in the morning and removing them in the evening. The patient should not sleep in the compression.   In addition, behavioral modification throughout the day will be continued.  This will include frequent elevation (such as in a recliner), use of over the counter pain medications as needed and exercise such as walking.  The systemic causes for chronic edema such as liver, kidney and cardiac etiologies does not appear to have significant changed over the past year.    The patient will continue aggressive use of the  lymph pump.  This will continue to improve the edema control and prevent sequela such as ulcers and infections.    2. Essential hypertension Continue antihypertensive medications as already ordered, these medications have been reviewed and there  are no changes at this time.  3. Hyperlipidemia, unspecified hyperlipidemia type Continue statin as ordered and reviewed, no changes at this time    Current Outpatient Medications on File Prior to Visit  Medication Sig Dispense Refill   acetaminophen (TYLENOL) 325 MG  tablet Take by mouth.     aspirin 81 MG chewable tablet Chew 81 mg by mouth daily.     atorvastatin (LIPITOR) 40 MG tablet Take 40 mg by mouth daily.     benzonatate (TESSALON PERLES) 100 MG capsule Take 1-2 tabs TID prn cough 30 capsule 0   carvedilol (COREG) 6.25 MG tablet Take by mouth.     Cholecalciferol 50 MCG (2000 UT) CAPS Take 2,000 Units by mouth daily.     furosemide (LASIX) 20 MG tablet Take by mouth.     furosemide (LASIX) 20 MG tablet Take by mouth.     ketoconazole (NIZORAL) 2 % cream APPLY CREAM TOPICALLY EVERY DAY AS DIRECTED     levothyroxine (SYNTHROID) 25 MCG tablet Take 25 mcg by mouth daily.     losartan (COZAAR) 100 MG tablet Take 100 mg by mouth daily.     meloxicam (MOBIC) 7.5 MG tablet Take 7.5 mg by mouth daily.     multivitamin-iron-minerals-folic acid (CENTRUM) chewable tablet Chew 1 tablet by mouth daily.     omeprazole (PRILOSEC) 40 MG capsule Take 40 mg by mouth daily.     polyethylene glycol powder (GLYCOLAX/MIRALAX) 17 GM/SCOOP powder 1 cap full in a full glass of water, two times a day for 3 days. 255 g 0   potassium chloride SA (KLOR-CON M) 20 MEQ tablet Take by mouth.     senna-docusate (SENOKOT-S) 8.6-50 MG tablet Take 2 tablets by mouth 2 (two) times daily. 120 tablet 0   bisacodyl (DULCOLAX) 10 MG suppository Place 1 suppository (10 mg total) rectally as needed for moderate constipation. (Patient not taking: Reported on 09/22/2023) 12 suppository 0   irbesartan-hydrochlorothiazide (AVALIDE) 150-12.5 MG tablet Take 1 tablet by mouth daily.     No current facility-administered medications on file prior to visit.    There are no Patient Instructions on file for this visit. No  follow-ups on file.   Georgiana Spinner, NP

## 2023-11-19 ENCOUNTER — Ambulatory Visit (INDEPENDENT_AMBULATORY_CARE_PROVIDER_SITE_OTHER): Payer: Medicare Other | Admitting: Nurse Practitioner

## 2023-11-24 ENCOUNTER — Encounter (INDEPENDENT_AMBULATORY_CARE_PROVIDER_SITE_OTHER): Payer: Self-pay

## 2023-12-08 ENCOUNTER — Emergency Department
Admission: EM | Admit: 2023-12-08 | Discharge: 2023-12-09 | Disposition: A | Discharge status: Home / Self Care | Attending: Emergency Medicine | Admitting: Emergency Medicine

## 2023-12-08 ENCOUNTER — Other Ambulatory Visit: Payer: Self-pay

## 2023-12-08 DIAGNOSIS — R197 Diarrhea, unspecified: Secondary | ICD-10-CM | POA: Insufficient documentation

## 2023-12-08 LAB — COMPREHENSIVE METABOLIC PANEL WITH GFR
ALT: 16 U/L (ref 0–44)
AST: 23 U/L (ref 15–41)
Albumin: 3.5 g/dL (ref 3.5–5.0)
Alkaline Phosphatase: 94 U/L (ref 38–126)
Anion gap: 8 (ref 5–15)
BUN: 29 mg/dL — ABNORMAL HIGH (ref 8–23)
CO2: 27 mmol/L (ref 22–32)
Calcium: 9 mg/dL (ref 8.9–10.3)
Chloride: 106 mmol/L (ref 98–111)
Creatinine, Ser: 1.14 mg/dL — ABNORMAL HIGH (ref 0.44–1.00)
GFR, Estimated: 46 mL/min — ABNORMAL LOW (ref >60)
Glucose, Bld: 134 mg/dL — ABNORMAL HIGH (ref 70–99)
Potassium: 3.8 mmol/L (ref 3.5–5.1)
Sodium: 141 mmol/L (ref 135–145)
Total Bilirubin: 1.1 mg/dL (ref 0.0–1.2)
Total Protein: 6.3 g/dL — ABNORMAL LOW (ref 6.5–8.1)

## 2023-12-08 LAB — CBC WITH DIFFERENTIAL/PLATELET
Abs Immature Granulocytes: 0.01 10*3/uL (ref 0.00–0.07)
Basophils Absolute: 0 10*3/uL (ref 0.0–0.1)
Basophils Relative: 0 %
Eosinophils Absolute: 0.1 10*3/uL (ref 0.0–0.5)
Eosinophils Relative: 2 %
HCT: 33.2 % — ABNORMAL LOW (ref 36.0–46.0)
Hemoglobin: 10.6 g/dL — ABNORMAL LOW (ref 12.0–15.0)
Immature Granulocytes: 0 %
Lymphocytes Relative: 23 %
Lymphs Abs: 1.2 10*3/uL (ref 0.7–4.0)
MCH: 27.2 pg (ref 26.0–34.0)
MCHC: 31.9 g/dL (ref 30.0–36.0)
MCV: 85.3 fL (ref 80.0–100.0)
Monocytes Absolute: 0.7 10*3/uL (ref 0.1–1.0)
Monocytes Relative: 13 %
Neutro Abs: 3.2 10*3/uL (ref 1.7–7.7)
Neutrophils Relative %: 62 %
Platelets: 169 10*3/uL (ref 150–400)
RBC: 3.89 MIL/uL (ref 3.87–5.11)
RDW: 16.9 % — ABNORMAL HIGH (ref 11.5–15.5)
WBC: 5.2 10*3/uL (ref 4.0–10.5)
nRBC: 0 % (ref 0.0–0.2)

## 2023-12-08 NOTE — ED Provider Notes (Addendum)
 [facility] Provider Note    Event Date/Time   First MD Initiated Contact with Patient 12/08/23 2259     (approximate)   History   Dizziness   HPI Latoya Sloan is a 89 y.o. female who presents with her daughter for evaluation of about a week of increased frequency of bowel movements.  Instead of 1 bowel movement a day, she has been going about 3 times a day, though the volume has been relatively small.  They are not excessively watery.  She has no abdominal pain.  She has not had any nausea or vomiting.  She is eating and drinking normally.  No urinary symptoms.  She denies chest pain or shortness of breath.  No recent fever or chills.  She states that 1 time earlier in the week she felt a little bit lightheaded when she stood up from a seated position, but that is not typical.  She has no sense of vertigo or that the room is spinning when she gets up and moves around.  Her daughter says that she brought her in because she discovered that the patient has been out of her potassium supplement for an unknown period of time and the [place] said that it would be 10 days before she could get another supply, so she was worried that that might be contributing to the issue.  She said that sometimes her mom gets tired if she gets up and moves around too much.  Her chronic peripheral edema has been stable.     Physical Exam   Triage Vital Signs: ED Triage Vitals  Encounter Vitals Group     BP 12/08/23 2047 (!) 177/64     Systolic BP Percentile --      Diastolic BP Percentile --      Pulse Rate 12/08/23 2047 (!) 59     Resp 12/08/23 2047 18     Temp 12/08/23 2047 98.2 F (36.8 C)     Temp src --      SpO2 12/08/23 2047 99 %     Weight 12/08/23 2046 104.3 kg (230 lb)     Height 12/08/23 2046 1.702 m (5\' 7" )     Head Circumference --      Peak Flow --      Pain Score 12/08/23 2045 0     Pain Loc --      Pain Education --      Exclude from Growth Chart --     Most  recent vital signs: Vitals:   12/08/23 2047  BP: (!) 177/64  Pulse: (!) 59  Resp: 18  Temp: 98.2 F (36.8 C)  SpO2: 99%    General: Awake, no distress.  Appears considerably younger than chronological age.  Alert and oriented, pleasant and communicative. CV:  Good peripheral perfusion.  Regular rate and rhythm, normal heart sounds. Resp:  Normal effort. Speaking easily and comfortably, no accessory muscle usage nor intercostal retractions.  Lungs clear to auscultation bilaterally. Abd:  No distention.  No tenderness to palpation throughout the abdomen. Other:  Chronic bilateral lower extremity lymphedema with compression stockings in place.  I can appreciate no focal neurological deficits; the patient is moving all of her extremities, no dysarthria nor aphasia, no dysmetria on finger-to-nose testing, no report of any vertigo at this time.   ED Results / Procedures / Treatments   Labs (all labs ordered are listed, but only abnormal results are displayed) Labs Reviewed  CBC WITH DIFFERENTIAL/PLATELET -  Abnormal; Notable for the following components:      Result Value   Hemoglobin 10.6 (*)    HCT 33.2 (*)    RDW 16.9 (*)    All other components within normal limits  COMPREHENSIVE METABOLIC PANEL WITH GFR - Abnormal; Notable for the following components:   Glucose, Bld 134 (*)    BUN 29 (*)    Creatinine, Ser 1.14 (*)    Total Protein 6.3 (*)    GFR, Estimated 46 (*)    All other components within normal limits     EKG  ED ECG REPORT I, Lynnda Sas, the attending physician, personally viewed and interpreted this ECG.  Date: 12/08/2023 EKG Time: 20: 49 Rate: 60 Rhythm: normal sinus rhythm QRS Axis: normal Intervals: Right bundle branch block ST/T Wave abnormalities: Non-specific ST segment / T-wave changes, but no clear evidence of acute ischemia. Narrative Interpretation: no definitive evidence of acute ischemia; does not meet STEMI  criteria.    PROCEDURES:  Critical Care performed: No  Procedures    IMPRESSION / MDM / ASSESSMENT AND PLAN / ED COURSE  I reviewed the triage vital signs and the nursing notes.                              Differential diagnosis includes, but is not limited to, electrolyte or metabolic abnormality, renal dysfunction, dehydration, CVA.  Patient's presentation is most consistent with acute presentation with potential threat to life or bodily function.  Labs/studies ordered: CMP, CBC with differential, EKG  Interventions/Medications given:  Medications - No data to display  (Note:  hospital course my include additional interventions and/or labs/studies not listed above.)  Patient is very well-appearing.  She is slightly hypertensive at this time but asymptomatic.  Her potassium is within normal limits which is reassuring.  Although she is having some diarrhea, it is relatively minimal volume.  Her daughter was most concerned about the potassium supplement but her potassium is being maintained now.  I offered to refill the potassium supplement but her daughter declined since they are getting that handled as an outpatient.  Patient's labs are all stable; she has a degree of chronic kidney disease but her creatinine is stable and actually somewhat improved from prior.  No significant or acute abnormalities identified on physical exam.  I initially considered additional advanced imaging such as CT or even MRI when I thought this was going to be more of a dizziness/vertigo situation, but the patient is asymptomatic at this time and she and her daughter are both ready to go home.  I think that is very reasonable.  I recommended close outpatient follow-up.  The patient's medical screening exam is reassuring with no indication of an emergent medical condition requiring hospitalization or additional evaluation at this point.  The patient is safe and appropriate for discharge and outpatient follow  up.      FINAL CLINICAL IMPRESSION(S) / ED DIAGNOSES   Final diagnoses:  Diarrhea, unspecified type     Rx / DC Orders   ED Discharge Orders     None        Note:  This document was prepared using Dragon voice recognition software and may include unintentional dictation errors.   Lynnda Sas, MD 12/08/23 1610    Lynnda Sas, MD 12/08/23 [phone]

## 2023-12-08 NOTE — Discharge Instructions (Addendum)
 Your workup in the Emergency Department today was reassuring.  We did not find any specific abnormalities.  We recommend you drink plenty of fluids, take your regular medications and/or any new ones prescribed today, and follow up with the doctor(s) listed in these documents as recommended.  Return to the Emergency Department if you develop new or worsening symptoms that concern you.

## 2023-12-08 NOTE — ED Triage Notes (Addendum)
 Pt reports dizziness and diarrhea x1 week. Pt denies any abd pain. Pt denies chest pain or palpitations. Pt states she has been out of her potassium medication for the past 3 weeks.

## 2024-02-15 ENCOUNTER — Encounter (INDEPENDENT_AMBULATORY_CARE_PROVIDER_SITE_OTHER): Payer: Self-pay

## 2024-02-15 ENCOUNTER — Ambulatory Visit (INDEPENDENT_AMBULATORY_CARE_PROVIDER_SITE_OTHER): Admitting: Nurse Practitioner

## 2024-02-15 ENCOUNTER — Telehealth (INDEPENDENT_AMBULATORY_CARE_PROVIDER_SITE_OTHER): Payer: Self-pay | Admitting: Nurse Practitioner

## 2024-02-15 VITALS — BP 176/77 | HR 60 | Resp 16

## 2024-02-15 DIAGNOSIS — I890 Lymphedema, not elsewhere classified: Secondary | ICD-10-CM

## 2024-02-15 NOTE — Telephone Encounter (Signed)
 Patient's daughter, Elson Reding [phone]) left voicemail on [facility] line. Wants to know if she can bring patient in today for appointment since she is out (they are currently at [facility]). States patient's feet are bothering her & her legs are swollen. She can hardly walk. Patient is currently scheduled for appointment on 03/24/24 for 6 month fu. Please advise

## 2024-02-15 NOTE — Telephone Encounter (Signed)
 She can come in and have Unna boots placed

## 2024-02-15 NOTE — Progress Notes (Signed)
 History of Present Illness  There is no documented history at this time  Assessments & Plan   There are no diagnoses linked to this encounter.    Additional instructions  Subjective:  Patient presents with venous ulcer of the Bilateral lower extremity.    Procedure:  3 layer unna wrap was placed Bilateral lower extremity.   Plan:   Follow up in one week.

## 2024-02-23 ENCOUNTER — Encounter (INDEPENDENT_AMBULATORY_CARE_PROVIDER_SITE_OTHER): Payer: Self-pay

## 2024-02-23 ENCOUNTER — Ambulatory Visit (INDEPENDENT_AMBULATORY_CARE_PROVIDER_SITE_OTHER)

## 2024-02-23 VITALS — BP 144/78 | Ht 67.0 in | Wt 230.0 lb

## 2024-02-23 DIAGNOSIS — I890 Lymphedema, not elsewhere classified: Secondary | ICD-10-CM

## 2024-02-23 NOTE — Progress Notes (Signed)
 History of Present Illness  There is no documented history at this time  Assessments & Plan   There are no diagnoses linked to this encounter.    Additional instructions  Subjective:  Patient presents with venous ulcer of the Bilateral lower extremity.    Procedure:  3 layer unna wrap was placed Bilateral lower extremity.   Plan:   Follow up in one week.

## 2024-03-01 ENCOUNTER — Encounter (INDEPENDENT_AMBULATORY_CARE_PROVIDER_SITE_OTHER): Payer: Self-pay

## 2024-03-01 ENCOUNTER — Ambulatory Visit (INDEPENDENT_AMBULATORY_CARE_PROVIDER_SITE_OTHER): Admitting: Nurse Practitioner

## 2024-03-01 VITALS — BP 179/70 | HR 60 | Resp 18 | Wt 247.8 lb

## 2024-03-01 DIAGNOSIS — I890 Lymphedema, not elsewhere classified: Secondary | ICD-10-CM | POA: Diagnosis not present

## 2024-03-01 NOTE — Progress Notes (Signed)
 History of Present Illness  There is no documented history at this time  Assessments & Plan   There are no diagnoses linked to this encounter.    Additional instructions  Subjective:  Patient presents with venous ulcer of the Bilateral lower extremity.    Procedure:  3 layer unna wrap was placed Bilateral lower extremity.   Plan:   Follow up in one week.

## 2024-03-09 ENCOUNTER — Encounter (INDEPENDENT_AMBULATORY_CARE_PROVIDER_SITE_OTHER): Payer: Self-pay | Admitting: Nurse Practitioner

## 2024-03-09 ENCOUNTER — Ambulatory Visit (INDEPENDENT_AMBULATORY_CARE_PROVIDER_SITE_OTHER): Admitting: Nurse Practitioner

## 2024-03-09 VITALS — BP 124/68 | HR 51 | Resp 18

## 2024-03-09 DIAGNOSIS — I890 Lymphedema, not elsewhere classified: Secondary | ICD-10-CM

## 2024-03-09 NOTE — Progress Notes (Signed)
 History of Present Illness  There is no documented history at this time  Assessments & Plan   There are no diagnoses linked to this encounter.    Additional instructions  Subjective:  Patient presents with venous ulcer of the Bilateral lower extremity.    Procedure:  3 layer unna wrap was placed Bilateral lower extremity.   Plan:   Follow up in one week.

## 2024-03-13 ENCOUNTER — Encounter (INDEPENDENT_AMBULATORY_CARE_PROVIDER_SITE_OTHER): Payer: Self-pay | Admitting: Nurse Practitioner

## 2024-03-15 ENCOUNTER — Encounter (INDEPENDENT_AMBULATORY_CARE_PROVIDER_SITE_OTHER): Payer: Self-pay

## 2024-03-15 ENCOUNTER — Ambulatory Visit (INDEPENDENT_AMBULATORY_CARE_PROVIDER_SITE_OTHER): Admitting: Nurse Practitioner

## 2024-03-15 VITALS — BP 150/85 | HR 52 | Resp 18

## 2024-03-15 DIAGNOSIS — I890 Lymphedema, not elsewhere classified: Secondary | ICD-10-CM

## 2024-03-15 NOTE — Progress Notes (Signed)
 History of Present Illness  There is no documented history at this time  Assessments & Plan   There are no diagnoses linked to this encounter.    Additional instructions  Subjective:  Patient presents with venous ulcer of the Bilateral lower extremity.    Procedure:  3 layer unna wrap was placed Bilateral lower extremity.   Plan:   Follow up in one week.

## 2024-03-24 ENCOUNTER — Ambulatory Visit (INDEPENDENT_AMBULATORY_CARE_PROVIDER_SITE_OTHER): Admitting: Nurse Practitioner

## 2024-03-24 ENCOUNTER — Encounter (INDEPENDENT_AMBULATORY_CARE_PROVIDER_SITE_OTHER): Payer: Self-pay | Admitting: Nurse Practitioner

## 2024-03-24 VITALS — BP 138/73 | HR 54 | Wt 252.5 lb

## 2024-03-24 DIAGNOSIS — E785 Hyperlipidemia, unspecified: Secondary | ICD-10-CM | POA: Diagnosis not present

## 2024-03-24 DIAGNOSIS — I890 Lymphedema, not elsewhere classified: Secondary | ICD-10-CM | POA: Diagnosis not present

## 2024-03-24 DIAGNOSIS — I10 Essential (primary) hypertension: Secondary | ICD-10-CM | POA: Diagnosis not present

## 2024-03-28 ENCOUNTER — Encounter (INDEPENDENT_AMBULATORY_CARE_PROVIDER_SITE_OTHER): Payer: Self-pay | Admitting: Nurse Practitioner

## 2024-03-28 NOTE — Progress Notes (Signed)
 Subjective:    Patient ID: Latoya Sloan, female    DOB: 1931/07/08, 89 y.o.   MRN: [id number] Chief Complaint  Patient presents with   Follow-up    The patient returns to the office for followup evaluation regarding leg swelling.  The swelling has improved quite a bit and the pain associated with swelling has decreased substantially. There have not been any interval development of a ulcerations or wounds.  Since the previous visit the patient has been wearing graduated Unna boots and has noted some improvement in the lymphedema. The patient has been using compression routinely morning until night.  The patient also states elevation during the day and exercise (such as walking) is being done too.       Review of Systems  Cardiovascular:  Positive for leg swelling.  Musculoskeletal:  Positive for gait problem.  Neurological:  Positive for weakness.  All other systems reviewed and are negative.      Objective:   Physical Exam Vitals reviewed.  HENT:     Head: Normocephalic.  Cardiovascular:     Rate and Rhythm: Normal rate.  Pulmonary:     Effort: Pulmonary effort is normal.  Musculoskeletal:     Right lower leg: Edema present.     Left lower leg: Edema present.  Skin:    General: Skin is warm and dry.  Neurological:     Mental Status: She is alert and oriented to person, place, and time.  Psychiatric:        Mood and Affect: Mood normal.        Behavior: Behavior normal.        Thought Content: Thought content normal.        Judgment: Judgment normal.     BP 138/73   Pulse (!) 54   Wt 252 lb 8 oz (114.5 kg)   BMI 39.55 kg/m   Past Medical History:  Diagnosis Date   Hypertension    Lymphedema of both lower extremities    Thyroid  disease     Social History   Socioeconomic History   Marital status: Widowed    Spouse name: Not on file   Number of children: Not on file   Years of education: Not on file   Highest education level: Not on file   Occupational History   Not on file  Tobacco Use   Smoking status: Never   Smokeless tobacco: Never  Substance and Sexual Activity   Alcohol use: Not Currently   Drug use: Not Currently   Sexual activity: Not on file  Other Topics Concern   Not on file  Social History Narrative   Not on file   Social Drivers of Health   Financial Resource Strain: Low Risk  (01/26/2024)   Received from [facility]   Overall Financial Resource Strain (CARDIA)    Difficulty of Paying Living Expenses: Not hard at all  Food Insecurity: No Food Insecurity (01/26/2024)   Received from [facility]   Hunger Vital Sign    Within the past 12 months, you worried that your food would run out before you got the money to buy more.: Never true    Within the past 12 months, the food you bought just didn't last and you didn't have money to get more.: Never true  Transportation Needs: No Transportation Needs (01/26/2024)   Received from [facility]    In the past 12 months, has lack  of transportation kept you from medical appointments or from getting medications?: No    Lack of Transportation (Non-Medical): No  Physical Activity: Not on file  Stress: Not on file  Social Connections: Unknown (09/02/2022)   Received from [facility]   Social Network    Social Network: Not on file  Intimate Partner Violence: Unknown (09/02/2022)   Received from [facility]   HITS    Physically Hurt: Not on file    Insult or Talk Down To: Not on file    Threaten Physical Harm: Not on file    Scream or Curse: Not on file    History reviewed. No pertinent surgical history.  History reviewed. No pertinent family history.  Allergies  Allergen Reactions   Hydrochlorothiazide     Other Reaction(s): Other  Possible HCTZ-induced pancreatitis   Aspirin     Other Reaction(s): Not available       Latest Ref Rng & Units 12/08/2023    8:48 PM  04/06/2022    3:41 PM 09/12/2021    8:51 AM  CBC  WBC 4.0 - 10.5 K/uL 5.2  5.6  8.2   Hemoglobin 12.0 - 15.0 g/dL 89.3  88.9  88.5   Hematocrit 36.0 - 46.0 % 33.2  35.0  36.7   Platelets 150 - 400 K/uL 169  216  190       CMP     Component Value Date/Time   NA 141 12/08/2023 2048   K 3.8 12/08/2023 2048   CL 106 12/08/2023 2048   CO2 27 12/08/2023 2048   GLUCOSE 134 (H) 12/08/2023 2048   BUN 29 (H) 12/08/2023 2048   CREATININE 1.14 (H) 12/08/2023 2048   CALCIUM 9.0 12/08/2023 2048   PROT 6.3 (L) 12/08/2023 2048   ALBUMIN 3.5 12/08/2023 2048   AST 23 12/08/2023 2048   ALT 16 12/08/2023 2048   ALKPHOS 94 12/08/2023 2048   BILITOT 1.1 12/08/2023 2048   GFRNONAA 46 (L) 12/08/2023 2048     No results found.     Assessment & Plan:   1. Lymphedema of both lower extremities (Primary) The patient showed improvement after being in Unna boots for several weeks.  There are no open wounds or ulcerations and the skin is much softer hardness or indentation.  Based on this the patient will return to utilizing medical grade compression elevation and consistent use of her lymphedema pump.  Will plan to have her return in 6 months or sooner if issues arise.  2. Essential hypertension Continue antihypertensive medications as already ordered, these medications have been reviewed and there are no changes at this time.  3. Hyperlipidemia, unspecified hyperlipidemia type Continue statin as ordered and reviewed, no changes at this time   Current Outpatient Medications on File Prior to Visit  Medication Sig Dispense Refill   acetaminophen (TYLENOL) 325 MG tablet Take by mouth.     aspirin 81 MG chewable tablet Chew 81 mg by mouth daily.     atorvastatin (LIPITOR) 40 MG tablet Take 40 mg by mouth daily.     benzonatate  (TESSALON  PERLES) 100 MG capsule Take 1-2 tabs TID prn cough 30 capsule 0   bisacodyl  (DULCOLAX) 10 MG suppository Place 1 suppository (10 mg total) rectally as needed for  moderate constipation. (Patient not taking: Reported on 03/24/2024) 12 suppository 0   carvedilol  (COREG ) 6.25 MG tablet Take by mouth.     Cholecalciferol 50 MCG (2000 UT) CAPS Take 2,000 Units by mouth daily.  furosemide  (LASIX ) 20 MG tablet Take by mouth.     furosemide  (LASIX ) 20 MG tablet Take by mouth.     irbesartan-hydrochlorothiazide (AVALIDE) 150-12.5 MG tablet Take 1 tablet by mouth daily.     ketoconazole (NIZORAL) 2 % cream APPLY CREAM TOPICALLY EVERY DAY AS DIRECTED     levothyroxine (SYNTHROID) 25 MCG tablet Take 25 mcg by mouth daily.     losartan (COZAAR) 100 MG tablet Take 100 mg by mouth daily.     meloxicam (MOBIC) 7.5 MG tablet Take 7.5 mg by mouth daily.     multivitamin-iron-minerals-folic acid (CENTRUM) chewable tablet Chew 1 tablet by mouth daily.     omeprazole (PRILOSEC) 40 MG capsule Take 40 mg by mouth daily.     polyethylene glycol powder (GLYCOLAX /MIRALAX ) 17 GM/SCOOP powder 1 cap full in a full glass of water, two times a day for 3 days. 255 g 0   senna-docusate (SENOKOT-S) 8.6-50 MG tablet Take 2 tablets by mouth 2 (two) times daily. 120 tablet 0   No current facility-administered medications on file prior to visit.    There are no Patient Instructions on file for this visit. No follow-ups on file.   Liora Myles E Makyra Corprew, NP

## 2024-04-12 ENCOUNTER — Telehealth (INDEPENDENT_AMBULATORY_CARE_PROVIDER_SITE_OTHER): Payer: Self-pay

## 2024-04-12 NOTE — Telephone Encounter (Signed)
 If it is the bottom of her feet, it is likely not her lymphedema.  I would reach out to your PCP

## 2024-04-12 NOTE — Telephone Encounter (Signed)
 Patient called in to nurse line, she reports that she wanted to make the office aware she has been having some trouble with the bottoms of her feet. She reports she has been elevating them at night but experiencing some redness and pain on the bottom wanting to know if she should be come in to be seen. She has not noticed any open areas but has a small amount of warmth.

## 2024-04-13 NOTE — Telephone Encounter (Signed)
 Call to patient to advise of message below per Orvin Daring. Patient verbalized understanding and will reach out to her primary care.

## 2024-05-23 ENCOUNTER — Telehealth (INDEPENDENT_AMBULATORY_CARE_PROVIDER_SITE_OTHER): Payer: Self-pay

## 2024-05-23 NOTE — Telephone Encounter (Signed)
 That is fine but she needs to be certain to use compression

## 2024-05-23 NOTE — Telephone Encounter (Signed)
 Patient daughter was notified with medical recommendation and verbalized understanding.

## 2024-05-23 NOTE — Telephone Encounter (Signed)
 Patient daughter left a message seeing if it will okay for her mother to travel 11 hours to [place]  for death in the family. The funeral will be Saturday. Please Advise on vascular recommendations.

## 2024-06-08 ENCOUNTER — Telehealth (INDEPENDENT_AMBULATORY_CARE_PROVIDER_SITE_OTHER): Payer: Self-pay | Admitting: Nurse Practitioner

## 2024-06-08 NOTE — Telephone Encounter (Signed)
 Patient called stating she needs to come in for an appointment. Her legs are sore. Compression socks aren't working. Lymp pump isn't working. Please advise what should be scheduled for patient

## 2024-06-08 NOTE — Telephone Encounter (Signed)
 No studies, she can see BP

## 2024-06-15 ENCOUNTER — Encounter (INDEPENDENT_AMBULATORY_CARE_PROVIDER_SITE_OTHER): Payer: Self-pay | Admitting: Vascular Surgery

## 2024-06-15 ENCOUNTER — Ambulatory Visit (INDEPENDENT_AMBULATORY_CARE_PROVIDER_SITE_OTHER): Admitting: Vascular Surgery

## 2024-06-15 VITALS — BP 165/79 | HR 53 | Resp 18 | Ht 67.0 in | Wt 260.4 lb

## 2024-06-15 DIAGNOSIS — I10 Essential (primary) hypertension: Secondary | ICD-10-CM | POA: Diagnosis not present

## 2024-06-15 DIAGNOSIS — E785 Hyperlipidemia, unspecified: Secondary | ICD-10-CM | POA: Diagnosis not present

## 2024-06-15 DIAGNOSIS — I890 Lymphedema, not elsewhere classified: Secondary | ICD-10-CM | POA: Diagnosis not present

## 2024-06-15 NOTE — Progress Notes (Signed)
 Subjective:    Patient ID: Latoya Sloan, female    DOB: [birth year], 89 y.o.   MRN: [id number] Chief Complaint  Patient presents with   Follow-up    Follow up no studies    Latoya Sloan is a 89 yo female who presents to clinic today in follow-up for bilateral lower extremity lymphedema and swelling.  Patient complains of discomfort to her lower extremities due to the amount of swelling.  She currently is wearing some kind of socks but I do not believe they are compression socks and off to provide any compression to her lower extremities.  She endorses she has a difficult time with compression and does not like to wear them and looking at her legs I do not believe she has been compliant with this as well.  She has a limb pump at home that she endorses using once a day.  I am not sure working at her legs if she is even doing that.  She endorses she knows she is supposed to use it twice a day but does not acknowledge doing so.  She does not elevate her legs as well and due to her morbid obesity she is not exercising at all either.  She does not have any's open sores or wounds to her bilateral lower legs.      History of Present Illness            Results           Review of Systems  Constitutional: Negative.   Cardiovascular:  Positive for leg swelling.  Musculoskeletal:  Positive for myalgias.  All other systems reviewed and are negative.      Objective:   Physical Exam Vitals reviewed.  Constitutional:      Appearance: Normal appearance. She is obese.     Comments: Morbid obesity.  Patient weighing 260 pounds with a BMI of 40.78.  HENT:     Head: Normocephalic and atraumatic.  Eyes:     Pupils: Pupils are equal, round, and reactive to light.  Cardiovascular:     Rate and Rhythm: Normal rate and regular rhythm.     Pulses: Normal pulses.     Heart sounds: Normal heart sounds.  Pulmonary:     Effort: Pulmonary effort is normal.     Breath sounds: Normal breath sounds.   Abdominal:     General: Bowel sounds are normal.     Palpations: Abdomen is soft.  Musculoskeletal:        General: Swelling and tenderness present.     Right lower leg: Edema present.     Left lower leg: Edema present.  Skin:    General: Skin is warm and dry.     Capillary Refill: Capillary refill takes 2 to 3 seconds.  Neurological:     General: No focal deficit present.     Mental Status: She is alert and oriented to person, place, and time. Mental status is at baseline.  Psychiatric:        Mood and Affect: Mood normal.        Behavior: Behavior normal.        Thought Content: Thought content normal.        Judgment: Judgment normal.     Physical Exam          BP (!) 165/79 (BP Location: Right Arm, Patient Position: Sitting, Cuff Size: Large)   Pulse (!) 53   Resp 18   Ht 5' 7 (1.702 m)  Wt 260 lb 6.4 oz (118.1 kg)   BMI 40.78 kg/m   Past Medical History:  Diagnosis Date   Hypertension    Lymphedema of both lower extremities    Thyroid  disease     Social History   Socioeconomic History   Marital status: Widowed    Spouse name: Not on file   Number of children: Not on file   Years of education: Not on file   Highest education level: Not on file  Occupational History   Not on file  Tobacco Use   Smoking status: Never   Smokeless tobacco: Never  Substance and Sexual Activity   Alcohol use: Not Currently   Drug use: Not Currently   Sexual activity: Not on file  Other Topics Concern   Not on file  Social History Narrative   Not on file   Social Drivers of Health   Financial Resource Strain: Low Risk  (04/26/2024)   Received from [facility]   Overall Financial Resource Strain (CARDIA)    Difficulty of Paying Living Expenses: Not hard at all  Food Insecurity: No Food Insecurity (04/26/2024)   Received from [facility]   Hunger Vital Sign    Within the past 12 months, you worried that your food would run out  before you got the money to buy more.: Never true    Within the past 12 months, the food you bought just didn't last and you didn't have money to get more.: Never true  Transportation Needs: No Transportation Needs (04/26/2024)   Received from [facility] - Transportation    In the past 12 months, has lack of transportation kept you from medical appointments or from getting medications?: No    Lack of Transportation (Non-Medical): No  Physical Activity: Not on file  Stress: Not on file  Social Connections: Unknown (09/02/2022)   Received from [facility]   Social Network    Social Network: Not on file  Intimate Partner Violence: Unknown (09/02/2022)   Received from [facility]   HITS    Physically Hurt: Not on file    Insult or Talk Down To: Not on file    Threaten Physical Harm: Not on file    Scream or Curse: Not on file    History reviewed. No pertinent surgical history.  History reviewed. No pertinent family history.  Allergies  Allergen Reactions   Hydrochlorothiazide     Other Reaction(s): Other  Possible HCTZ-induced pancreatitis   Aspirin     Other Reaction(s): Not available       Latest Ref Rng & Units 12/08/2023    8:48 PM 04/06/2022    3:41 PM 09/12/2021    8:51 AM  CBC  WBC 4.0 - 10.5 K/uL 5.2  5.6  8.2   Hemoglobin 12.0 - 15.0 g/dL 89.3  88.9  88.5   Hematocrit 36.0 - 46.0 % 33.2  35.0  36.7   Platelets 150 - 400 K/uL 169  216  190       CMP     Component Value Date/Time   NA 141 12/08/2023 2048   K 3.8 12/08/2023 2048   CL 106 12/08/2023 2048   CO2 27 12/08/2023 2048   GLUCOSE 134 (H) 12/08/2023 2048   BUN 29 (H) 12/08/2023 2048   CREATININE 1.14 (H) 12/08/2023 2048   CALCIUM 9.0 12/08/2023 2048   PROT 6.3 (L) 12/08/2023 2048   ALBUMIN 3.5 12/08/2023 2048  AST 23 12/08/2023 2048   ALT 16 12/08/2023 2048   ALKPHOS 94 12/08/2023 2048   BILITOT 1.1 12/08/2023 2048   GFRNONAA 46 (L) 12/08/2023 2048     No  results found.     Assessment & Plan:   1. Lymphedema of both lower extremities (Primary) Patient returns to clinic today with +3 to +4 bilateral lower extremity edema.  She is not compliant with any type of compression, rest, elevation or exercise.  She is also morbidly obese and not doing anything about controlling her weight.  I believe most of the swelling to her lower extremities comes from the fact that her thighs are extremely large and resisting any type of flow within her vasculature or lymph.  Today she complains of her toes and her feet being swollen as well.  Vascular surgery will place the patient in Unna boots for the next 5 weeks and follow-up on that fifth visit to reevaluate her lower extremities.  I had a long detailed discussion today with the patient regarding her weight and how she needs to see her primary care physician and start working on it otherwise her lower extremities will never reduce in size.  I also encouraged her to commit to doing conventional therapy regardless of whether compression socks do not feel comfortable or not and elevating her legs even if that does not feel comfortable.  2. Essential hypertension Continue antihypertensive medications as already ordered, these medications have been reviewed and there are no changes at this time.  3. Hyperlipidemia, unspecified hyperlipidemia type Continue statin as ordered and reviewed, no changes at this time               Current Outpatient Medications on File Prior to Visit  Medication Sig Dispense Refill   acetaminophen (TYLENOL) 325 MG tablet Take by mouth.     aspirin 81 MG chewable tablet Chew 81 mg by mouth daily.     atorvastatin (LIPITOR) 40 MG tablet Take 40 mg by mouth daily.     benzonatate  (TESSALON  PERLES) 100 MG capsule Take 1-2 tabs TID prn cough 30 capsule 0   bisacodyl  (DULCOLAX) 10 MG suppository Place 1 suppository (10 mg total) rectally as needed for moderate constipation. (Patient not  taking: Reported on 06/15/2024) 12 suppository 0   carvedilol  (COREG ) 6.25 MG tablet Take by mouth.     Cholecalciferol 50 MCG (2000 UT) CAPS Take 2,000 Units by mouth daily.     furosemide  (LASIX ) 20 MG tablet Take by mouth.     furosemide  (LASIX ) 20 MG tablet Take by mouth.     irbesartan-hydrochlorothiazide (AVALIDE) 150-12.5 MG tablet Take 1 tablet by mouth daily.     ketoconazole (NIZORAL) 2 % cream APPLY CREAM TOPICALLY EVERY DAY AS DIRECTED     levothyroxine (SYNTHROID) 25 MCG tablet Take 25 mcg by mouth daily.     losartan (COZAAR) 100 MG tablet Take 100 mg by mouth daily.     meloxicam (MOBIC) 7.5 MG tablet Take 7.5 mg by mouth daily.     multivitamin-iron-minerals-folic acid (CENTRUM) chewable tablet Chew 1 tablet by mouth daily.     omeprazole (PRILOSEC) 40 MG capsule Take 40 mg by mouth daily.     polyethylene glycol powder (GLYCOLAX /MIRALAX ) 17 GM/SCOOP powder 1 cap full in a full glass of water, two times a day for 3 days. 255 g 0   senna-docusate (SENOKOT-S) 8.6-50 MG tablet Take 2 tablets by mouth 2 (two) times daily. 120 tablet 0   No  current facility-administered medications on file prior to visit.    There are no Patient Instructions on file for this visit. No follow-ups on file.   Gwendlyn JONELLE Shank, NP

## 2024-06-23 ENCOUNTER — Ambulatory Visit (INDEPENDENT_AMBULATORY_CARE_PROVIDER_SITE_OTHER)

## 2024-06-23 VITALS — BP 150/71 | HR 60 | Resp 18

## 2024-06-23 DIAGNOSIS — I890 Lymphedema, not elsewhere classified: Secondary | ICD-10-CM | POA: Diagnosis not present

## 2024-06-23 NOTE — Progress Notes (Signed)
 History of Present Illness  There is no documented history at this time  Assessments & Plan   There are no diagnoses linked to this encounter.    Additional instructions  Subjective:  Patient presents with venous ulcer of the Bilateral lower extremity.    Procedure:  3 layer unna wrap was placed Bilateral lower extremity.   Plan:   Follow up in one week.

## 2024-06-28 ENCOUNTER — Encounter (INDEPENDENT_AMBULATORY_CARE_PROVIDER_SITE_OTHER): Payer: Self-pay | Admitting: Nurse Practitioner

## 2024-06-28 ENCOUNTER — Ambulatory Visit (INDEPENDENT_AMBULATORY_CARE_PROVIDER_SITE_OTHER): Admitting: Nurse Practitioner

## 2024-06-28 DIAGNOSIS — I890 Lymphedema, not elsewhere classified: Secondary | ICD-10-CM | POA: Diagnosis not present

## 2024-06-28 NOTE — Progress Notes (Signed)
 History of Present Illness  There is no documented history at this time  Assessments & Plan   There are no diagnoses linked to this encounter.    Additional instructions  Subjective:  Patient presents with venous ulcer of the Bilateral lower extremity.    Procedure:  3 layer unna wrap was placed Bilateral lower extremity.   Plan:   Follow up in one week.

## 2024-07-05 ENCOUNTER — Ambulatory Visit (INDEPENDENT_AMBULATORY_CARE_PROVIDER_SITE_OTHER): Admitting: Nurse Practitioner

## 2024-07-05 VITALS — BP 154/72 | HR 53 | Ht 67.0 in | Wt 254.8 lb

## 2024-07-05 DIAGNOSIS — I890 Lymphedema, not elsewhere classified: Secondary | ICD-10-CM | POA: Diagnosis not present

## 2024-07-05 NOTE — Progress Notes (Signed)
 History of Present Illness  There is no documented history at this time  Assessments & Plan   There are no diagnoses linked to this encounter.    Additional instructions  Subjective:  Patient presents with venous ulcer of the Bilateral lower extremity.    Procedure:  3 layer unna wrap was placed Bilateral lower extremity.  Patient reports that her wraps started to feel tight on Sunday as her legs began swelling, she reports she cut the top sides on Sunday and removed them completely on Monday presents today in ted hose.  Unna applied with one coban at this time not 2 to allow for selling per pt.   Plan:   Follow up in one week.

## 2024-07-10 ENCOUNTER — Encounter (INDEPENDENT_AMBULATORY_CARE_PROVIDER_SITE_OTHER): Payer: Self-pay | Admitting: Nurse Practitioner

## 2024-07-12 ENCOUNTER — Encounter (INDEPENDENT_AMBULATORY_CARE_PROVIDER_SITE_OTHER): Payer: Self-pay

## 2024-07-12 ENCOUNTER — Ambulatory Visit (INDEPENDENT_AMBULATORY_CARE_PROVIDER_SITE_OTHER)

## 2024-07-12 VITALS — BP 153/64 | HR 52 | Resp 18

## 2024-07-12 DIAGNOSIS — I890 Lymphedema, not elsewhere classified: Secondary | ICD-10-CM | POA: Diagnosis not present

## 2024-07-12 NOTE — Progress Notes (Signed)
 History of Present Illness  There is no documented history at this time  Assessments & Plan   There are no diagnoses linked to this encounter.    Additional instructions  Subjective:  Patient presents with venous ulcer of the Bilateral lower extremity.    Procedure:  3 layer unna wrap was placed Bilateral lower extremity.   Plan:   Follow up in one week.

## 2024-07-21 ENCOUNTER — Ambulatory Visit (INDEPENDENT_AMBULATORY_CARE_PROVIDER_SITE_OTHER): Admitting: Nurse Practitioner

## 2024-07-21 ENCOUNTER — Encounter (INDEPENDENT_AMBULATORY_CARE_PROVIDER_SITE_OTHER): Payer: Self-pay | Admitting: Nurse Practitioner

## 2024-07-21 VITALS — BP 143/75 | HR 58 | Resp 17 | Ht 67.0 in | Wt 260.4 lb

## 2024-07-21 DIAGNOSIS — E785 Hyperlipidemia, unspecified: Secondary | ICD-10-CM

## 2024-07-21 DIAGNOSIS — I890 Lymphedema, not elsewhere classified: Secondary | ICD-10-CM

## 2024-07-21 DIAGNOSIS — I10 Essential (primary) hypertension: Secondary | ICD-10-CM

## 2024-07-21 NOTE — Progress Notes (Signed)
 "  Subjective:    Patient ID: Latoya Sloan, female    DOB: 04/05/1932, 89 y.o.   MRN: [id number] Chief Complaint  Patient presents with   Follow-up    Nonie     HPI  Discussed the use of AI scribe software for clinical note transcription with the patient, who gave verbal consent to proceed.  History of Present Illness Latoya Sloan is a 89 year old female with chronic bilateral lower extremity lymphedema who presents for follow-up regarding persistent swelling and soreness.  She has chronic bilateral lower extremity swelling, with recent acute worsening following removal of compression wraps. Swelling increased today while her legs were dependent, and she reports the swelling just started again. She experiences significant soreness and tenderness in her legs, which limits her ability to perform massage.  She uses compression stockings at home inconsistently due to discomfort and difficulty finding an appropriate size. She applies Eucerin lotion and castor oil to her legs as recommended, but is unable to massage her legs due to pain.  She utilizes a lymphedema pump at home, typically once daily in the mornings, but has not used it regularly since the application of compression wraps due to discomfort and difficulty managing the device.  She reports heaviness in her legs, particularly at night and when rising from bed to ambulate. She has difficulty moving her leg from sitting to standing and is unable to cross one leg over the other due to pain and swelling. She also notes chronic knee pain but is not considering surgical intervention.  She reports that no one has ever told her she has neuropathy.    Results     Review of Systems     Objective:   Physical Exam  Physical Exam    BP (!) 143/75   Pulse (!) 58   Resp 17   Ht 5' 7 (1.702 m)   Wt 260 lb 6.4 oz (118.1 kg)   BMI 40.78 kg/m   Past Medical History:  Diagnosis Date   Hypertension    Lymphedema of both lower  extremities    Thyroid  disease     Social History   Socioeconomic History   Marital status: Widowed    Spouse name: Not on file   Number of children: Not on file   Years of education: Not on file   Highest education level: Not on file  Occupational History   Not on file  Tobacco Use   Smoking status: Never   Smokeless tobacco: Never  Substance and Sexual Activity   Alcohol use: Not Currently   Drug use: Not Currently   Sexual activity: Not on file  Other Topics Concern   Not on file  Social History Narrative   Not on file   Social Drivers of Health   Tobacco Use: Low Risk (07/21/2024)   Patient History    Smoking Tobacco Use: Never    Smokeless Tobacco Use: Never    Passive Exposure: Not on file  Financial Resource Strain: Low Risk  (04/26/2024)   Received from [facility]   Overall Financial Resource Strain (CARDIA)    Difficulty of Paying Living Expenses: Not hard at all  Food Insecurity: No Food Insecurity (04/26/2024)   Received from [facility]   Epic    Within the past 12 months, you worried that your food would run out before you got the money to buy more.: Never true    Within the past 12 months, the food  you bought just didn't last and you didn't have money to get more.: Never true  Transportation Needs: No Transportation Needs (04/26/2024)   Received from [facility] - Transportation    In the past 12 months, has lack of transportation kept you from medical appointments or from getting medications?: No    Lack of Transportation (Non-Medical): No  Physical Activity: Not on file  Stress: Not on file  Social Connections: Unknown (09/02/2022)   Received from [facility]   Social Network    Social Network: Not on file  Intimate Partner Violence: Unknown (09/02/2022)   Received from [facility]   HITS    Physically Hurt: Not on file    Insult or Talk Down To: Not on file    Threaten Physical  Harm: Not on file    Scream or Curse: Not on file  Depression (EYV7-0): Not on file  Alcohol Screen: Not on file  Housing: Low Risk  (04/26/2024)   Received from [facility]   Epic    In the last 12 months, was there a time when you were not able to pay the mortgage or rent on time?: No    In the past 12 months, how many times have you moved where you were living?: 0    At any time in the past 12 months, were you homeless or living in a shelter (including now)?: No  Utilities: Not At Risk (04/26/2024)   Received from [facility]   Epic    In the past 12 months has the electric, gas, oil, or water company threatened to shut off services in your home?: No  Health Literacy: Not on file    No past surgical history on file.  No family history on file.  Allergies[1]     Latest Ref Rng & Units 12/08/2023    8:48 PM 04/06/2022    3:41 PM 09/12/2021    8:51 AM  CBC  WBC 4.0 - 10.5 K/uL 5.2  5.6  8.2   Hemoglobin 12.0 - 15.0 g/dL 89.3  88.9  88.5   Hematocrit 36.0 - 46.0 % 33.2  35.0  36.7   Platelets 150 - 400 K/uL 169  216  190       CMP     Component Value Date/Time   NA 141 12/08/2023 2048   K 3.8 12/08/2023 2048   CL 106 12/08/2023 2048   CO2 27 12/08/2023 2048   GLUCOSE 134 (H) 12/08/2023 2048   BUN 29 (H) 12/08/2023 2048   CREATININE 1.14 (H) 12/08/2023 2048   CALCIUM 9.0 12/08/2023 2048   PROT 6.3 (L) 12/08/2023 2048   ALBUMIN 3.5 12/08/2023 2048   AST 23 12/08/2023 2048   ALT 16 12/08/2023 2048   ALKPHOS 94 12/08/2023 2048   BILITOT 1.1 12/08/2023 2048   GFRNONAA 46 (L) 12/08/2023 2048     No results found.     Assessment & Plan:   1. Lymphedema of both lower extremities (Primary) Lymphedema of bilateral lower extremities Chronic lymphedema with persistent swelling, soreness, and heaviness. Conservative management remains the mainstay to control symptoms and prevent progression. - Resume lymphedema pump use twice daily,  morning and afternoon, when not using wraps. - Trial different sizes of compression stockings to improve comfort and compliance. - Continue conservative therapies: compression, elevation, activity, and pump use. - Continue use of Eucerin and castor oil for skin care as instructed by therapist. - Monitor for  worsening swelling or symptoms and seek earlier evaluation if needed. - Follow-up in four weeks to reassess response to therapy changes. - Discuss leg heaviness and movement difficulty with primary care provider for possible orthopedic referral if musculoskeletal etiology is suspected.  2. Essential hypertension Continue antihypertensive medications as already ordered, these medications have been reviewed and there are no changes at this time.  3. Hyperlipidemia, unspecified hyperlipidemia type Continue statin as ordered and reviewed, no changes at this time   Assessment and Plan Assessment & Plan      Medications Ordered Prior to Encounter[2]  There are no Patient Instructions on file for this visit. Return in about 4 weeks (around 08/18/2024) for No studies JD/FB.   Ameen Mostafa E Deaundre Allston, NP      [1]  Allergies Allergen Reactions   Hydrochlorothiazide     Other Reaction(s): Other  Possible HCTZ-induced pancreatitis   Aspirin     Other Reaction(s): Not available  [2]  Current Outpatient Medications on File Prior to Visit  Medication Sig Dispense Refill   acetaminophen (TYLENOL) 325 MG tablet Take by mouth.     aspirin 81 MG chewable tablet Chew 81 mg by mouth daily.     atorvastatin (LIPITOR) 40 MG tablet Take 40 mg by mouth daily.     benzonatate  (TESSALON  PERLES) 100 MG capsule Take 1-2 tabs TID prn cough 30 capsule 0   bisacodyl  (DULCOLAX) 10 MG suppository Place 1 suppository (10 mg total) rectally as needed for moderate constipation. 12 suppository 0   carvedilol  (COREG ) 6.25 MG tablet Take by mouth.     Cholecalciferol 50 MCG (2000 UT) CAPS Take 2,000 Units by  mouth daily.     furosemide  (LASIX ) 20 MG tablet Take by mouth.     furosemide  (LASIX ) 20 MG tablet Take by mouth.     irbesartan-hydrochlorothiazide (AVALIDE) 150-12.5 MG tablet Take 1 tablet by mouth daily.     ketoconazole (NIZORAL) 2 % cream APPLY CREAM TOPICALLY EVERY DAY AS DIRECTED     levothyroxine (SYNTHROID) 25 MCG tablet Take 25 mcg by mouth daily.     losartan (COZAAR) 100 MG tablet Take 100 mg by mouth daily.     meloxicam (MOBIC) 7.5 MG tablet Take 7.5 mg by mouth daily.     multivitamin-iron-minerals-folic acid (CENTRUM) chewable tablet Chew 1 tablet by mouth daily.     omeprazole (PRILOSEC) 40 MG capsule Take 40 mg by mouth daily.     polyethylene glycol powder (GLYCOLAX /MIRALAX ) 17 GM/SCOOP powder 1 cap full in a full glass of water, two times a day for 3 days. 255 g 0   senna-docusate (SENOKOT-S) 8.6-50 MG tablet Take 2 tablets by mouth 2 (two) times daily. 120 tablet 0   No current facility-administered medications on file prior to visit.   "

## 2024-08-18 ENCOUNTER — Ambulatory Visit (INDEPENDENT_AMBULATORY_CARE_PROVIDER_SITE_OTHER): Admitting: Nurse Practitioner

## 2024-09-21 ENCOUNTER — Ambulatory Visit (INDEPENDENT_AMBULATORY_CARE_PROVIDER_SITE_OTHER): Admitting: Nurse Practitioner
